# Patient Record
Sex: Female | Born: 1945 | Race: White | Hispanic: No | Marital: Married | Smoking: Former smoker
Health system: Southern US, Community
[De-identification: ages and names within clinical notes are randomized; demographics above are authoritative.]

## PROBLEM LIST (undated history)

## (undated) DIAGNOSIS — M1990 Unspecified osteoarthritis, unspecified site: Secondary | ICD-10-CM

## (undated) DIAGNOSIS — Z973 Presence of spectacles and contact lenses: Secondary | ICD-10-CM

## (undated) DIAGNOSIS — R51 Headache: Secondary | ICD-10-CM

## (undated) DIAGNOSIS — N390 Urinary tract infection, site not specified: Secondary | ICD-10-CM

## (undated) DIAGNOSIS — N189 Chronic kidney disease, unspecified: Secondary | ICD-10-CM

## (undated) DIAGNOSIS — J45909 Unspecified asthma, uncomplicated: Secondary | ICD-10-CM

## (undated) DIAGNOSIS — E785 Hyperlipidemia, unspecified: Secondary | ICD-10-CM

## (undated) DIAGNOSIS — M4316 Spondylolisthesis, lumbar region: Secondary | ICD-10-CM

## (undated) DIAGNOSIS — I10 Essential (primary) hypertension: Secondary | ICD-10-CM

## (undated) DIAGNOSIS — R011 Cardiac murmur, unspecified: Secondary | ICD-10-CM

## (undated) DIAGNOSIS — Z9889 Other specified postprocedural states: Secondary | ICD-10-CM

## (undated) DIAGNOSIS — T7840XA Allergy, unspecified, initial encounter: Secondary | ICD-10-CM

## (undated) DIAGNOSIS — F329 Major depressive disorder, single episode, unspecified: Secondary | ICD-10-CM

## (undated) DIAGNOSIS — F32A Depression, unspecified: Secondary | ICD-10-CM

## (undated) DIAGNOSIS — R112 Nausea with vomiting, unspecified: Secondary | ICD-10-CM

## (undated) DIAGNOSIS — G4730 Sleep apnea, unspecified: Secondary | ICD-10-CM

## (undated) HISTORY — PX: COLONOSCOPY: SHX174

## (undated) HISTORY — PX: ABDOMINAL HYSTERECTOMY: SHX81

## (undated) HISTORY — PX: CARDIAC CATHETERIZATION: SHX172

## (undated) HISTORY — PX: JOINT REPLACEMENT: SHX530

## (undated) HISTORY — PX: SHOULDER ARTHROSCOPY: SHX128

---

## 1998-12-09 HISTORY — PX: BREAST BIOPSY: SHX20

## 2005-01-01 ENCOUNTER — Ambulatory Visit: Payer: Self-pay | Admitting: Urology

## 2005-01-24 ENCOUNTER — Other Ambulatory Visit: Payer: Self-pay

## 2005-02-14 ENCOUNTER — Emergency Department: Payer: Self-pay | Admitting: Emergency Medicine

## 2005-03-11 ENCOUNTER — Ambulatory Visit: Payer: Self-pay | Admitting: Urology

## 2005-05-22 ENCOUNTER — Ambulatory Visit: Payer: Self-pay | Admitting: Family Medicine

## 2005-06-19 ENCOUNTER — Ambulatory Visit: Payer: Self-pay | Admitting: Otolaryngology

## 2005-06-26 ENCOUNTER — Ambulatory Visit: Payer: Self-pay | Admitting: Otolaryngology

## 2005-09-09 ENCOUNTER — Ambulatory Visit: Payer: Self-pay | Admitting: Unknown Physician Specialty

## 2006-03-04 ENCOUNTER — Inpatient Hospital Stay: Payer: Self-pay | Admitting: General Practice

## 2006-08-25 ENCOUNTER — Ambulatory Visit: Payer: Self-pay | Admitting: Obstetrics & Gynecology

## 2006-08-28 ENCOUNTER — Ambulatory Visit: Payer: Self-pay

## 2008-03-31 ENCOUNTER — Ambulatory Visit: Payer: Self-pay | Admitting: Family Medicine

## 2008-06-20 ENCOUNTER — Ambulatory Visit: Payer: Self-pay | Admitting: General Practice

## 2008-07-04 ENCOUNTER — Inpatient Hospital Stay: Payer: Self-pay | Admitting: General Practice

## 2008-08-10 ENCOUNTER — Ambulatory Visit: Payer: Self-pay | Admitting: General Practice

## 2008-09-15 ENCOUNTER — Ambulatory Visit: Payer: Self-pay | Admitting: General Practice

## 2008-09-28 ENCOUNTER — Ambulatory Visit: Payer: Self-pay | Admitting: General Practice

## 2008-10-06 ENCOUNTER — Encounter: Payer: Self-pay | Admitting: General Practice

## 2008-10-09 ENCOUNTER — Encounter: Payer: Self-pay | Admitting: General Practice

## 2008-11-08 ENCOUNTER — Encounter: Payer: Self-pay | Admitting: General Practice

## 2008-12-09 ENCOUNTER — Encounter: Payer: Self-pay | Admitting: General Practice

## 2009-03-20 ENCOUNTER — Encounter: Admission: RE | Admit: 2009-03-20 | Discharge: 2009-03-20 | Payer: Self-pay | Admitting: Neurology

## 2009-05-08 ENCOUNTER — Ambulatory Visit: Payer: Self-pay

## 2010-02-02 ENCOUNTER — Ambulatory Visit: Payer: Self-pay | Admitting: Unknown Physician Specialty

## 2010-03-20 ENCOUNTER — Ambulatory Visit: Payer: Self-pay | Admitting: Family Medicine

## 2010-05-03 ENCOUNTER — Ambulatory Visit: Payer: Self-pay

## 2010-05-16 ENCOUNTER — Ambulatory Visit: Payer: Self-pay | Admitting: Pain Medicine

## 2010-06-05 ENCOUNTER — Ambulatory Visit: Payer: Self-pay | Admitting: Cardiology

## 2010-06-07 ENCOUNTER — Ambulatory Visit: Payer: Self-pay | Admitting: Pain Medicine

## 2010-06-25 ENCOUNTER — Ambulatory Visit: Payer: Self-pay | Admitting: Pain Medicine

## 2010-08-07 ENCOUNTER — Ambulatory Visit: Payer: Self-pay | Admitting: Pain Medicine

## 2010-09-03 ENCOUNTER — Ambulatory Visit: Payer: Self-pay | Admitting: Pain Medicine

## 2010-09-13 ENCOUNTER — Ambulatory Visit: Payer: Self-pay | Admitting: Pain Medicine

## 2011-09-04 ENCOUNTER — Ambulatory Visit: Payer: Self-pay | Admitting: Family Medicine

## 2012-03-16 ENCOUNTER — Ambulatory Visit: Payer: Self-pay | Admitting: Physician Assistant

## 2012-03-17 ENCOUNTER — Ambulatory Visit: Payer: Self-pay | Admitting: Otolaryngology

## 2012-05-14 ENCOUNTER — Ambulatory Visit: Payer: Self-pay | Admitting: Pain Medicine

## 2012-06-18 ENCOUNTER — Ambulatory Visit: Payer: Self-pay | Admitting: Orthopedic Surgery

## 2012-06-25 ENCOUNTER — Ambulatory Visit: Payer: Self-pay | Admitting: Pain Medicine

## 2012-06-25 ENCOUNTER — Other Ambulatory Visit: Payer: Self-pay | Admitting: Orthopedic Surgery

## 2012-06-30 ENCOUNTER — Encounter (HOSPITAL_COMMUNITY): Payer: Self-pay | Admitting: Pharmacy Technician

## 2012-06-30 ENCOUNTER — Encounter (HOSPITAL_COMMUNITY): Payer: Self-pay

## 2012-06-30 ENCOUNTER — Encounter (HOSPITAL_COMMUNITY)
Admission: RE | Admit: 2012-06-30 | Discharge: 2012-06-30 | Disposition: A | Discharge status: Home / Self Care | Payer: Medicare Other | Source: Ambulatory Visit | Attending: Orthopedic Surgery | Admitting: Orthopedic Surgery

## 2012-06-30 HISTORY — DX: Unspecified osteoarthritis, unspecified site: M19.90

## 2012-06-30 HISTORY — DX: Sleep apnea, unspecified: G47.30

## 2012-06-30 HISTORY — DX: Chronic kidney disease, unspecified: N18.9

## 2012-06-30 HISTORY — DX: Essential (primary) hypertension: I10

## 2012-06-30 HISTORY — DX: Headache: R51

## 2012-06-30 HISTORY — DX: Other specified postprocedural states: R11.2

## 2012-06-30 HISTORY — DX: Other specified postprocedural states: Z98.890

## 2012-06-30 LAB — COMPREHENSIVE METABOLIC PANEL
Albumin: 3.8 g/dL (ref 3.5–5.2)
Alkaline Phosphatase: 84 U/L (ref 39–117)
BUN: 18 mg/dL (ref 6–23)
Creatinine, Ser: 0.78 mg/dL (ref 0.50–1.10)
GFR calc Af Amer: >90 mL/min (ref >90)
Glucose, Bld: 94 mg/dL (ref 70–99)
Total Bilirubin: 0.3 mg/dL (ref 0.3–1.2)
Total Protein: 6.8 g/dL (ref 6.0–8.3)

## 2012-06-30 LAB — PROTIME-INR
INR: 0.89 (ref 0.00–1.49)
Prothrombin Time: 12.2 seconds (ref 11.6–15.2)

## 2012-06-30 LAB — TYPE AND SCREEN: Antibody Screen: NEGATIVE

## 2012-06-30 LAB — CBC
Hemoglobin: 15.1 g/dL — ABNORMAL HIGH (ref 12.0–15.0)
MCH: 28.8 pg (ref 26.0–34.0)
MCHC: 32.8 g/dL (ref 30.0–36.0)
MCV: 87.8 fL (ref 78.0–100.0)
RBC: 5.25 MIL/uL — ABNORMAL HIGH (ref 3.87–5.11)

## 2012-06-30 LAB — URINALYSIS, ROUTINE W REFLEX MICROSCOPIC
Bilirubin Urine: NEGATIVE
Hgb urine dipstick: NEGATIVE
Ketones, ur: NEGATIVE mg/dL
Nitrite: NEGATIVE
Specific Gravity, Urine: 1.017 (ref 1.005–1.030)
pH: 7.5 (ref 5.0–8.0)

## 2012-06-30 LAB — SURGICAL PCR SCREEN: Staphylococcus aureus: NEGATIVE

## 2012-06-30 NOTE — Pre-Procedure Instructions (Addendum)
20 Paige Higgins  06/30/2012  Your procedure is scheduled on:  07-07-2012  Report to [facility] at 8:15 AM.  Call this number if you have problems the morning of surgery: [phone]   Remember:   Do not eat food or drink:After Midnight.  .    Take these medicines the morning of surgery with A SIP OF WATER: inhalers as needed,flonase,pain medication as needed,   Do not wear jewelry, make-up or nail polish.  Do not wear lotions, powders, or perfumes. You may wear deodorant.  Do not shave 48 hours prior to surgery. Men may shave face and neck.  Do not bring valuables to the hospital.  Contacts, dentures or bridgework may not be worn into surgery.  Leave suitcase in the car. After surgery it may be brought to your room.  For patients admitted to the hospital, checkout time is 11:00 AM the day of discharge.   Patients discharged the day of surgery will not be allowed to drive home.    Special Instructions: Incentive Spirometry - Practice and bring it with you on the day of surgery. and CHG Shower Use Special Wash: 1/2 bottle night before surgery and 1/2 bottle morning of surgery.   Please read over the following fact sheets that you were given: Pain Booklet, Blood Transfusion Information, MRSA Information and Surgical Site Infection Prevention

## 2012-06-30 NOTE — Progress Notes (Signed)
Requested stress test from [facility] and [place] study. Pt. To call back with phone number for Sleep Center

## 2012-07-01 NOTE — Consult Note (Signed)
Anesthesia Chart Review:  Patient is a 66 year old female scheduled for left reverse total shoulder on 07/07/12 by Dr. Ave Filter.  History includes former smoker, post-operative N/V, HTN, OSA, headaches, arthritis, UTI.  PCP is Dr. Burnett Sheng.  Her Cardiologist is Dr. Marcina Millard at [facility]).  She was last seen on 06/26/12.  He felt she would be low risk for this procedure.    EKG on 06/30/12 showed NSR, findings suggestive of RV conduction delay, anterior and inferior ST/T wave abnormality consider ischemia, incomplete right BBB.  We received comparison EKG from 2012 and 2011 from [facility] that appeared similar. On her 2012 EKG she had T wave inversion in V4-6 as well.    She had an abnormal stress test on 05/25/10 showing evidence of inferior ischemia, EF 50%.  She subsequently had a cardiac cath on 05/28/10 that showed normal coronary anatomy and EF 54%.    CXR on 06/30/12 showed cardiac enlargement. No active cardiopulmonary disease.   Labs acceptable.  Plan to proceed if no significant change in her status.  Shonna Chock, PA-C

## 2012-07-06 MED ORDER — CEFAZOLIN SODIUM-DEXTROSE 2-3 GM-% IV SOLR
2.0000 g | INTRAVENOUS | Status: AC
  Administered 2012-07-07: 2 g via INTRAVENOUS
  Filled 2012-07-06: qty 50

## 2012-07-07 ENCOUNTER — Ambulatory Visit (HOSPITAL_COMMUNITY): Payer: Medicare Other | Admitting: Vascular Surgery

## 2012-07-07 ENCOUNTER — Encounter (HOSPITAL_COMMUNITY)
Admission: RE | Disposition: A | Discharge status: Home / Self Care | Payer: Self-pay | Source: Ambulatory Visit | Attending: Orthopedic Surgery

## 2012-07-07 ENCOUNTER — Encounter (HOSPITAL_COMMUNITY): Payer: Self-pay | Admitting: Vascular Surgery

## 2012-07-07 ENCOUNTER — Inpatient Hospital Stay (HOSPITAL_COMMUNITY)
Admission: RE | Admit: 2012-07-07 | Discharge: 2012-07-09 | DRG: 484 | Disposition: A | Discharge status: Home / Self Care | Payer: Medicare Other | Source: Ambulatory Visit | Attending: Orthopedic Surgery | Admitting: Orthopedic Surgery

## 2012-07-07 ENCOUNTER — Inpatient Hospital Stay (HOSPITAL_COMMUNITY): Payer: Medicare Other

## 2012-07-07 ENCOUNTER — Encounter (HOSPITAL_COMMUNITY): Payer: Self-pay | Admitting: Surgery

## 2012-07-07 DIAGNOSIS — I129 Hypertensive chronic kidney disease with stage 1 through stage 4 chronic kidney disease, or unspecified chronic kidney disease: Secondary | ICD-10-CM | POA: Diagnosis present

## 2012-07-07 DIAGNOSIS — N189 Chronic kidney disease, unspecified: Secondary | ICD-10-CM | POA: Diagnosis present

## 2012-07-07 DIAGNOSIS — M719 Bursopathy, unspecified: Secondary | ICD-10-CM | POA: Diagnosis present

## 2012-07-07 DIAGNOSIS — M19019 Primary osteoarthritis, unspecified shoulder: Principal | ICD-10-CM | POA: Diagnosis present

## 2012-07-07 DIAGNOSIS — M67919 Unspecified disorder of synovium and tendon, unspecified shoulder: Secondary | ICD-10-CM | POA: Diagnosis present

## 2012-07-07 DIAGNOSIS — Z96659 Presence of unspecified artificial knee joint: Secondary | ICD-10-CM

## 2012-07-07 HISTORY — PX: ARTHROPLASTY: SHX135

## 2012-07-07 HISTORY — PX: REVERSE SHOULDER ARTHROPLASTY: SHX5054

## 2012-07-07 SURGERY — ARTHROPLASTY, SHOULDER, TOTAL, REVERSE
Anesthesia: General | Site: Shoulder | Laterality: Left | Wound class: Clean

## 2012-07-07 MED ORDER — ONDANSETRON HCL 4 MG PO TABS
4.0000 mg | ORAL_TABLET | Freq: Four times a day (QID) | ORAL | Status: DC | PRN
  Administered 2012-07-09: 4 mg via ORAL
  Filled 2012-07-07: qty 1

## 2012-07-07 MED ORDER — ZOLPIDEM TARTRATE 5 MG PO TABS: 5.0000 mg | ORAL_TABLET | Freq: Every evening | ORAL | Status: DC | PRN

## 2012-07-07 MED ORDER — ALBUTEROL SULFATE (5 MG/ML) 0.5% IN NEBU: 2.5000 mg | INHALATION_SOLUTION | Freq: Four times a day (QID) | RESPIRATORY_TRACT | Status: DC | PRN

## 2012-07-07 MED ORDER — METOCLOPRAMIDE HCL 5 MG PO TABS
5.0000 mg | ORAL_TABLET | Freq: Three times a day (TID) | ORAL | Status: DC | PRN
  Filled 2012-07-07: qty 2

## 2012-07-07 MED ORDER — FENTANYL CITRATE 0.05 MG/ML IJ SOLN
INTRAMUSCULAR | Status: DC | PRN
  Administered 2012-07-07 (×2): 100 ug via INTRAVENOUS

## 2012-07-07 MED ORDER — PROPOFOL 10 MG/ML IV EMUL
INTRAVENOUS | Status: DC | PRN
  Administered 2012-07-07: 200 mg via INTRAVENOUS

## 2012-07-07 MED ORDER — ACETAMINOPHEN 650 MG RE SUPP: 650.0000 mg | Freq: Four times a day (QID) | RECTAL | Status: DC | PRN

## 2012-07-07 MED ORDER — ALUM & MAG HYDROXIDE-SIMETH 200-200-20 MG/5ML PO SUSP: 30.0000 mL | ORAL | Status: DC | PRN

## 2012-07-07 MED ORDER — FENTANYL CITRATE 0.05 MG/ML IJ SOLN
50.0000 ug | INTRAMUSCULAR | Status: DC | PRN
  Administered 2012-07-07: 100 ug via INTRAVENOUS

## 2012-07-07 MED ORDER — DEXTROSE 5 % IV SOLN
INTRAVENOUS | Status: DC | PRN
  Administered 2012-07-07: 11:00:00 via INTRAVENOUS

## 2012-07-07 MED ORDER — MIDAZOLAM HCL 2 MG/2ML IJ SOLN
INTRAMUSCULAR | Status: AC
  Filled 2012-07-07: qty 2

## 2012-07-07 MED ORDER — ALBUTEROL SULFATE HFA 108 (90 BASE) MCG/ACT IN AERS: 2.0000 | INHALATION_SPRAY | RESPIRATORY_TRACT | Status: DC | PRN

## 2012-07-07 MED ORDER — LIDOCAINE HCL (CARDIAC) 20 MG/ML IV SOLN
INTRAVENOUS | Status: DC | PRN
  Administered 2012-07-07: 90 mg via INTRAVENOUS

## 2012-07-07 MED ORDER — SODIUM CHLORIDE 0.9 % IR SOLN
Status: DC | PRN
  Administered 2012-07-07: 1000 mL

## 2012-07-07 MED ORDER — METHOCARBAMOL 100 MG/ML IJ SOLN
500.0000 mg | Freq: Four times a day (QID) | INTRAVENOUS | Status: DC | PRN
  Filled 2012-07-07: qty 5

## 2012-07-07 MED ORDER — ONDANSETRON HCL 4 MG/2ML IJ SOLN: 4.0000 mg | Freq: Once | INTRAMUSCULAR | Status: DC | PRN

## 2012-07-07 MED ORDER — MORPHINE SULFATE 2 MG/ML IJ SOLN
2.0000 mg | INTRAMUSCULAR | Status: DC | PRN
  Administered 2012-07-07 – 2012-07-08 (×3): 2 mg via INTRAVENOUS
  Filled 2012-07-07 (×4): qty 1

## 2012-07-07 MED ORDER — TRETINOIN 0.025 % EX CREA
1.0000 "application " | TOPICAL_CREAM | Freq: Every day | CUTANEOUS | Status: DC
  Filled 2012-07-07: qty 45

## 2012-07-07 MED ORDER — ONDANSETRON HCL 4 MG/2ML IJ SOLN: 4.0000 mg | Freq: Four times a day (QID) | INTRAMUSCULAR | Status: DC | PRN

## 2012-07-07 MED ORDER — PHENOL 1.4 % MT LIQD: 1.0000 | OROMUCOSAL | Status: DC | PRN

## 2012-07-07 MED ORDER — SCOPOLAMINE 1 MG/3DAYS TD PT72
1.0000 | MEDICATED_PATCH | TRANSDERMAL | Status: DC
  Administered 2012-07-07: 1.5 mg via TRANSDERMAL
  Filled 2012-07-07: qty 1

## 2012-07-07 MED ORDER — HYDROMORPHONE HCL PF 1 MG/ML IJ SOLN
0.2500 mg | INTRAMUSCULAR | Status: DC | PRN
  Administered 2012-07-07 (×2): 0.5 mg via INTRAVENOUS

## 2012-07-07 MED ORDER — DOCUSATE SODIUM 100 MG PO CAPS
100.0000 mg | ORAL_CAPSULE | Freq: Two times a day (BID) | ORAL | Status: DC
  Administered 2012-07-07 – 2012-07-09 (×4): 100 mg via ORAL
  Filled 2012-07-07 (×4): qty 1

## 2012-07-07 MED ORDER — MIDAZOLAM HCL 2 MG/2ML IJ SOLN
1.0000 mg | INTRAMUSCULAR | Status: DC | PRN
  Administered 2012-07-07: 4 mg via INTRAVENOUS

## 2012-07-07 MED ORDER — OXYCODONE-ACETAMINOPHEN 5-325 MG PO TABS
1.0000 | ORAL_TABLET | ORAL | Status: DC | PRN
  Administered 2012-07-07 – 2012-07-09 (×8): 2 via ORAL
  Filled 2012-07-07 (×9): qty 2

## 2012-07-07 MED ORDER — METHOCARBAMOL 500 MG PO TABS
500.0000 mg | ORAL_TABLET | Freq: Four times a day (QID) | ORAL | Status: DC | PRN
  Administered 2012-07-07 – 2012-07-09 (×3): 500 mg via ORAL
  Filled 2012-07-07 (×3): qty 1

## 2012-07-07 MED ORDER — ACETAMINOPHEN 10 MG/ML IV SOLN
INTRAVENOUS | Status: DC | PRN
  Administered 2012-07-07: 1000 mg via INTRAVENOUS

## 2012-07-07 MED ORDER — FENTANYL CITRATE 0.05 MG/ML IJ SOLN
INTRAMUSCULAR | Status: AC
  Filled 2012-07-07: qty 2

## 2012-07-07 MED ORDER — ONDANSETRON HCL 4 MG/2ML IJ SOLN
INTRAMUSCULAR | Status: DC | PRN
  Administered 2012-07-07: 4 mg via INTRAVENOUS

## 2012-07-07 MED ORDER — LACTATED RINGERS IV SOLN
INTRAVENOUS | Status: DC | PRN
  Administered 2012-07-07 (×3): via INTRAVENOUS

## 2012-07-07 MED ORDER — BISACODYL 5 MG PO TBEC: 5.0000 mg | DELAYED_RELEASE_TABLET | Freq: Every day | ORAL | Status: DC | PRN

## 2012-07-07 MED ORDER — ACETAMINOPHEN 325 MG PO TABS: 650.0000 mg | ORAL_TABLET | Freq: Four times a day (QID) | ORAL | Status: DC | PRN

## 2012-07-07 MED ORDER — CEFAZOLIN SODIUM 1-5 GM-% IV SOLN
1.0000 g | Freq: Four times a day (QID) | INTRAVENOUS | Status: AC
  Administered 2012-07-07 – 2012-07-08 (×3): 1 g via INTRAVENOUS
  Filled 2012-07-07 (×4): qty 50

## 2012-07-07 MED ORDER — BUPIVACAINE-EPINEPHRINE PF 0.5-1:200000 % IJ SOLN
INTRAMUSCULAR | Status: DC | PRN
  Administered 2012-07-07: 20 mL

## 2012-07-07 MED ORDER — NEOSTIGMINE METHYLSULFATE 1 MG/ML IJ SOLN
INTRAMUSCULAR | Status: DC | PRN
  Administered 2012-07-07: 4 mg via INTRAVENOUS

## 2012-07-07 MED ORDER — SUMATRIPTAN SUCCINATE 50 MG PO TABS
50.0000 mg | ORAL_TABLET | ORAL | Status: DC | PRN
  Administered 2012-07-08 (×2): 50 mg via ORAL
  Filled 2012-07-07 (×2): qty 1

## 2012-07-07 MED ORDER — DIPHENHYDRAMINE HCL 12.5 MG/5ML PO ELIX: 12.5000 mg | ORAL_SOLUTION | ORAL | Status: DC | PRN

## 2012-07-07 MED ORDER — ASPIRIN EC 325 MG PO TBEC
325.0000 mg | DELAYED_RELEASE_TABLET | Freq: Two times a day (BID) | ORAL | Status: DC
  Administered 2012-07-07 – 2012-07-09 (×5): 325 mg via ORAL
  Filled 2012-07-07 (×6): qty 1

## 2012-07-07 MED ORDER — LACTATED RINGERS IV SOLN
INTRAVENOUS | Status: DC
Start: 2012-07-07 — End: 2012-07-07

## 2012-07-07 MED ORDER — ROCURONIUM BROMIDE 100 MG/10ML IV SOLN
INTRAVENOUS | Status: DC | PRN
  Administered 2012-07-07: 50 mg via INTRAVENOUS
  Administered 2012-07-07 (×2): 10 mg via INTRAVENOUS

## 2012-07-07 MED ORDER — GLYCOPYRROLATE 0.2 MG/ML IJ SOLN
INTRAMUSCULAR | Status: DC | PRN
  Administered 2012-07-07: 0.6 mg via INTRAVENOUS

## 2012-07-07 MED ORDER — TRIAMTERENE-HCTZ 37.5-25 MG PO TABS
1.0000 | ORAL_TABLET | Freq: Every day | ORAL | Status: DC
  Administered 2012-07-07 – 2012-07-09 (×3): 1 via ORAL
  Filled 2012-07-07 (×3): qty 1

## 2012-07-07 MED ORDER — HYDROMORPHONE HCL PF 1 MG/ML IJ SOLN
INTRAMUSCULAR | Status: AC
  Administered 2012-07-07: 1 mg
  Filled 2012-07-07: qty 1

## 2012-07-07 MED ORDER — MENTHOL 3 MG MT LOZG
1.0000 | LOZENGE | OROMUCOSAL | Status: DC | PRN
  Filled 2012-07-07: qty 9

## 2012-07-07 MED ORDER — IPRATROPIUM BROMIDE 0.06 % NA SOLN
2.0000 | Freq: Two times a day (BID) | NASAL | Status: DC
  Administered 2012-07-07 – 2012-07-09 (×3): 2 via NASAL
  Filled 2012-07-07: qty 15

## 2012-07-07 MED ORDER — METOCLOPRAMIDE HCL 5 MG/ML IJ SOLN
5.0000 mg | Freq: Three times a day (TID) | INTRAMUSCULAR | Status: DC | PRN
  Administered 2012-07-07: 10 mg via INTRAVENOUS
  Filled 2012-07-07: qty 2

## 2012-07-07 MED ORDER — CHLORHEXIDINE GLUCONATE 0.12 % MT SOLN
15.0000 mL | Freq: Two times a day (BID) | OROMUCOSAL | Status: DC
  Administered 2012-07-07 – 2012-07-09 (×3): 15 mL via OROMUCOSAL
  Filled 2012-07-07 (×5): qty 15

## 2012-07-07 MED ORDER — FLUTICASONE PROPIONATE 50 MCG/ACT NA SUSP: 2.0000 | Freq: Every day | NASAL | Status: DC | PRN

## 2012-07-07 MED ORDER — PHENYLEPHRINE HCL 10 MG/ML IJ SOLN
INTRAMUSCULAR | Status: DC | PRN
  Administered 2012-07-07 (×2): 40 ug via INTRAVENOUS
  Administered 2012-07-07: 80 ug via INTRAVENOUS
  Administered 2012-07-07: 40 ug via INTRAVENOUS

## 2012-07-07 MED ORDER — ATORVASTATIN CALCIUM 40 MG PO TABS
40.0000 mg | ORAL_TABLET | Freq: Every day | ORAL | Status: DC
  Administered 2012-07-07 – 2012-07-08 (×2): 40 mg via ORAL
  Filled 2012-07-07 (×3): qty 1

## 2012-07-07 MED ORDER — POVIDONE-IODINE 7.5 % EX SOLN
Freq: Once | CUTANEOUS | Status: DC
  Filled 2012-07-07: qty 118

## 2012-07-07 SURGICAL SUPPLY — 67 items
BIT DRILL 170X2.5X (BIT) ×1 IMPLANT
BIT DRL 170X2.5X (BIT) ×1
BLADE SAW SAG 73X25 THK (BLADE) ×1
BLADE SAW SGTL 73X25 THK (BLADE) ×1 IMPLANT
BOWL SMART MIX CTS (DISPOSABLE) IMPLANT
CHLORAPREP W/TINT 26ML (MISCELLANEOUS) ×2 IMPLANT
CLOTH BEACON ORANGE TIMEOUT ST (SAFETY) ×2 IMPLANT
CLSR STERI-STRIP ANTIMIC 1/2X4 (GAUZE/BANDAGES/DRESSINGS) ×2 IMPLANT
COVER SURGICAL LIGHT HANDLE (MISCELLANEOUS) ×2 IMPLANT
DRAPE INCISE IOBAN 66X45 STRL (DRAPES) ×2 IMPLANT
DRAPE SURG 17X23 STRL (DRAPES) ×2 IMPLANT
DRAPE U-SHAPE 47X51 STRL (DRAPES) ×2 IMPLANT
DRILL 2.5 (BIT) ×1
DRSG ADAPTIC 3X8 NADH LF (GAUZE/BANDAGES/DRESSINGS) ×2 IMPLANT
DRSG PAD ABDOMINAL 8X10 ST (GAUZE/BANDAGES/DRESSINGS) ×2 IMPLANT
ELECT REM PT RETURN 9FT ADLT (ELECTROSURGICAL) ×2
ELECTRODE REM PT RTRN 9FT ADLT (ELECTROSURGICAL) ×1 IMPLANT
EVACUATOR 1/8 PVC DRAIN (DRAIN) ×2 IMPLANT
GLOVE BIO SURGEON STRL SZ7 (GLOVE) ×2 IMPLANT
GLOVE BIO SURGEON STRL SZ7.5 (GLOVE) ×2 IMPLANT
GLOVE BIOGEL PI IND STRL 8 (GLOVE) ×1 IMPLANT
GLOVE BIOGEL PI INDICATOR 8 (GLOVE) ×1
GOWN PREVENTION PLUS LG XLONG (DISPOSABLE) ×2 IMPLANT
GOWN PREVENTION PLUS XLARGE (GOWN DISPOSABLE) ×2 IMPLANT
GOWN STRL NON-REIN LRG LVL3 (GOWN DISPOSABLE) ×4 IMPLANT
HANDPIECE INTERPULSE COAX TIP (DISPOSABLE) ×1
HEMOSTAT SURGICEL 2X14 (HEMOSTASIS) IMPLANT
HOOD PEEL AWAY FACE SHEILD DIS (HOOD) ×4 IMPLANT
KIT BASIN OR (CUSTOM PROCEDURE TRAY) ×2 IMPLANT
KIT ROOM TURNOVER OR (KITS) ×2 IMPLANT
MANIFOLD NEPTUNE II (INSTRUMENTS) ×2 IMPLANT
NEEDLE HYPO 25GX1X1/2 BEV (NEEDLE) ×2 IMPLANT
NEEDLE MAYO TROCAR (NEEDLE) ×2 IMPLANT
NOZZLE PRISM 8.5MM (MISCELLANEOUS) ×2 IMPLANT
NS IRRIG 1000ML POUR BTL (IV SOLUTION) ×2 IMPLANT
PACK SHOULDER (CUSTOM PROCEDURE TRAY) ×2 IMPLANT
PAD ARMBOARD 7.5X6 YLW CONV (MISCELLANEOUS) ×4 IMPLANT
PIN GUIDE 1.2 (PIN) ×2 IMPLANT
PIN GUIDE GLENOPHERE 1.5MX300M (PIN) ×2 IMPLANT
PIN METAGLENE 2.5 (PIN) ×2 IMPLANT
RETRIEVER SUT HEWSON (MISCELLANEOUS) IMPLANT
SET HNDPC FAN SPRY TIP SCT (DISPOSABLE) ×1 IMPLANT
SLING ARM IMMOBILIZER LRG (SOFTGOODS) ×2 IMPLANT
SLING ARM IMMOBILIZER MED (SOFTGOODS) IMPLANT
SPONGE GAUZE 4X4 12PLY (GAUZE/BANDAGES/DRESSINGS) ×2 IMPLANT
SPONGE LAP 18X18 X RAY DECT (DISPOSABLE) ×2 IMPLANT
SPONGE LAP 4X18 X RAY DECT (DISPOSABLE) ×4 IMPLANT
STRIP CLOSURE SKIN 1/2X4 (GAUZE/BANDAGES/DRESSINGS) ×2 IMPLANT
SUCTION FRAZIER TIP 10 FR DISP (SUCTIONS) ×2 IMPLANT
SUPPORT WRAP ARM LG (MISCELLANEOUS) ×2 IMPLANT
SUT ETHIBOND 2 OS 4 DA (SUTURE) ×2 IMPLANT
SUT FIBERWIRE #2 38 T-5 BLUE (SUTURE) ×8
SUT MNCRL AB 4-0 PS2 18 (SUTURE) ×2 IMPLANT
SUT SILK 2 0 TIES 17X18 (SUTURE) ×1
SUT SILK 2-0 18XBRD TIE BLK (SUTURE) ×1 IMPLANT
SUT VIC AB 0 CTB1 27 (SUTURE) ×2 IMPLANT
SUT VIC AB 2-0 CT1 27 (SUTURE) ×2
SUT VIC AB 2-0 CT1 TAPERPNT 27 (SUTURE) ×2 IMPLANT
SUTURE FIBERWR #2 38 T-5 BLUE (SUTURE) ×4 IMPLANT
SYR CONTROL 10ML LL (SYRINGE) ×2 IMPLANT
SYRINGE TOOMEY DISP (SYRINGE) ×2 IMPLANT
TAPE CLOTH SURG 6X10 WHT LF (GAUZE/BANDAGES/DRESSINGS) ×2 IMPLANT
TOWEL OR 17X24 6PK STRL BLUE (TOWEL DISPOSABLE) ×2 IMPLANT
TOWEL OR 17X26 10 PK STRL BLUE (TOWEL DISPOSABLE) ×2 IMPLANT
TRAY FOLEY CATH 14FR (SET/KITS/TRAYS/PACK) IMPLANT
WATER STERILE IRR 1000ML POUR (IV SOLUTION) ×2 IMPLANT
YANKAUER SUCT BULB TIP NO VENT (SUCTIONS) ×2 IMPLANT

## 2012-07-07 NOTE — Transfer of Care (Signed)
Immediate Anesthesia Transfer of Care Note  Patient: Paige Higgins  Procedure(s) Performed: Procedure(s) (LRB): REVERSE SHOULDER ARTHROPLASTY (Left)  Patient Location: PACU  Anesthesia Type: General  Level of Consciousness: awake and patient cooperative  Airway & Oxygen Therapy: Patient Spontanous Breathing and Patient connected to nasal cannula oxygen  Post-op Assessment: Report given to PACU RN, Post -op Vital signs reviewed and stable and Patient moving all extremities  Post vital signs: Reviewed and stable  Complications: No apparent anesthesia complications

## 2012-07-07 NOTE — H&P (Signed)
Paige Higgins is an 66 y.o. female.   Chief Complaint: L shoulder pain HPI: L shoulder rotator cuff tear arthropathy, failed nonoperative treatment  Past Medical History  Diagnosis Date  . PONV (postoperative nausea and vomiting)   . Hypertension   . Sleep apnea     sleep Study Normal  . Chronic kidney disease     hx. UTI  . Headache   . Arthritis     Past Surgical History  Procedure Date  . Abdominal hysterectomy   . Joint replacement     bilateral knee replacements  . Shoulder arthroscopy     right shoulder    History reviewed. No pertinent family history. Social History:  reports that she has quit smoking. Her smoking use included Cigarettes. She has a 4 pack-year smoking history. She does not have any smokeless tobacco history on file. She reports that she does not drink alcohol or use illicit drugs.  Allergies:  Allergies  Allergen Reactions  . Prednisone Rash and Other (See Comments)    Headache, facial redness.    Medications Prior to Admission  Medication Sig Dispense Refill  . albuterol (PROVENTIL) (2.5 MG/3ML) 0.083% nebulizer solution Take 2.5 mg by nebulization every 6 (six) hours as needed. For shortness of breath or wheezing.      Marland Kitchen atorvastatin (LIPITOR) 40 MG tablet Take 40 mg by mouth daily.      . celecoxib (CELEBREX) 200 MG capsule Take 200-400 mg by mouth daily as needed. For pain.      . chlorhexidine (PERIDEX) 0.12 % solution Use as directed 15 mLs in the mouth or throat 2 (two) times daily. Rinse and spit.      . fluticasone (FLONASE) 50 MCG/ACT nasal spray Place 2 sprays into the nose daily as needed. For congestion.      Marland Kitchen HYDROcodone-acetaminophen (NORCO/VICODIN) 5-325 MG per tablet Take 1-2 tablets by mouth every 12 (twelve) hours as needed. For pain.      Marland Kitchen ipratropium (ATROVENT) 0.06 % nasal spray Place 2 sprays into the nose 2 (two) times daily.      . SUMAtriptan (IMITREX) 50 MG tablet Take 50 mg by mouth every 2 (two) hours as needed. For  migraine.      . tretinoin (RETIN-A) 0.025 % cream Apply 1 application topically at bedtime.      . triamterene-hydrochlorothiazide (MAXZIDE-25) 37.5-25 MG per tablet Take 1 tablet by mouth daily.      Marland Kitchen albuterol (PROVENTIL HFA;VENTOLIN HFA) 108 (90 BASE) MCG/ACT inhaler Inhale 2 puffs into the lungs every 4 (four) hours as needed. For wheezing.        No results found for this or any previous visit (from the past 48 hour(s)). No results found.  Review of Systems  All other systems reviewed and are negative.    Blood pressure 139/89, pulse 97, temperature 97.6 F (36.4 C), temperature source Oral, resp. rate 18, SpO2 99.00%. Physical Exam  Constitutional: She is oriented to person, place, and time. She appears well-developed and well-nourished.  HENT:  Head: Atraumatic.  Eyes: EOM are normal.  Neck: Neck supple.  Cardiovascular: Intact distal pulses.   Respiratory: Effort normal.  Musculoskeletal:       Left shoulder: She exhibits decreased range of motion and tenderness. She exhibits no swelling.  Neurological: She is alert and oriented to person, place, and time.  Skin: Skin is warm and dry.  Psychiatric: She has a normal mood and affect.     Assessment/Plan L shoulder  rotator cuff tear arthropathy Plan L reverse total shoulder replacement Risks / benefits of surgery discussed Consent on chart  NPO for OR Preop antibiotics   Morell Mears WILLIAM 07/07/2012, 10:26 AM

## 2012-07-07 NOTE — Progress Notes (Signed)
Pt arouses left arm in a sling fingers warm pt states she feels sensation able to move fingers 3+radial pulse

## 2012-07-07 NOTE — Progress Notes (Signed)
Patient Paige Higgins, 66 year old white female is struggling with several health challenges.  This [name],  native expresses a positive attitude.  She expressed appreciation for Chaplain's provision of pastoral prayer, presence, and conversation.  I will follow up as needed.

## 2012-07-07 NOTE — Anesthesia Preprocedure Evaluation (Addendum)
Anesthesia Evaluation  Patient identified by MRN, date of birth, ID band Patient awake    Reviewed: Allergy & Precautions, H&P , NPO status , Patient's Chart, lab work & pertinent test results, reviewed documented beta blocker date and time   History of Anesthesia Complications (+) PONV  Airway Mallampati: I TM Distance: >3 FB Neck ROM: Full    Dental  (+) Teeth Intact, Dental Advisory Given, Caps and Implants   Pulmonary sleep apnea ,  breath sounds clear to auscultation        Cardiovascular hypertension, Pt. on medications Rhythm:Regular Rate:Normal     Neuro/Psych    GI/Hepatic   Endo/Other  Morbid obesity  Renal/GU      Musculoskeletal   Abdominal   Peds  Hematology   Anesthesia Other Findings Top front multi unit implants...  Reproductive/Obstetrics                         Anesthesia Physical Anesthesia Plan  ASA: II  Anesthesia Plan: General   Post-op Pain Management:    Induction: Intravenous  Airway Management Planned: Oral ETT  Additional Equipment:   Intra-op Plan:   Post-operative Plan: Extubation in OR  Informed Consent: I have reviewed the patients History and Physical, chart, labs and discussed the procedure including the risks, benefits and alternatives for the proposed anesthesia with the patient or authorized representative who has indicated his/her understanding and acceptance.   Dental advisory given  Plan Discussed with: CRNA, Anesthesiologist and Surgeon  Anesthesia Plan Comments:         Anesthesia Quick Evaluation

## 2012-07-07 NOTE — Progress Notes (Signed)
Clamped hemovac per order

## 2012-07-07 NOTE — Anesthesia Procedure Notes (Addendum)
Procedure Name: Intubation Date/Time: 07/07/2012 10:58 AM Performed by: Darcey Nora B Pre-anesthesia Checklist: Patient identified, Emergency Drugs available, Suction available and Patient being monitored Patient Re-evaluated:Patient Re-evaluated prior to inductionOxygen Delivery Method: Circle system utilized Preoxygenation: Pre-oxygenation with 100% oxygen Intubation Type: IV induction Ventilation: Mask ventilation without difficulty Laryngoscope Size: Mac and 4 Grade View: Grade I Tube type: Oral Tube size: 7.5 mm Number of attempts: 1 Airway Equipment and Method: Stylet Placement Confirmation: ETT inserted through vocal cords under direct vision,  positive ETCO2 and breath sounds checked- equal and bilateral Secured at: 21 (cm at teeth) cm Tube secured with: Tape Dental Injury: Teeth and Oropharynx as per pre-operative assessment    Anesthesia Regional Block:  Interscalene brachial plexus block  Pre-Anesthetic Checklist: ,, timeout performed, Correct Patient, Correct Site, Correct Laterality, Correct Procedure, Correct Position, site marked, Risks and benefits discussed,  Surgical consent,  Pre-op evaluation,  At surgeon's request and post-op pain management  Laterality: Left and Upper  Prep: chloraprep       Needles:  Injection technique: Single-shot  Needle Type: Echogenic Needle     Needle Length: 5cm 5 cm Needle Gauge: 22 and 22 G    Additional Needles:  Procedures: ultrasound guided Interscalene brachial plexus block Narrative:  Start time: 07/07/2012 10:25 AM End time: 07/07/2012 10:33 AM Injection made incrementally with aspirations every 5 mL.  Performed by: Personally  Anesthesiologist: Sheldon Silvan  Supraclavicular block

## 2012-07-07 NOTE — Op Note (Signed)
Procedure(s): REVERSE SHOULDER ARTHROPLASTY Procedure Note  Paige Higgins female 66 y.o. 07/07/2012  Procedure(s) and Anesthesia Type:    * REVERSE SHOULDER ARTHROPLASTY - Choice   Indications:  66 y.o. 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 Pain and dysfunction interfered with quality of life and nonoperative treatment with activity modification, NSAIDS and injections failed.     Surgeon: Mable Paris   Assistants: Skip Mayer PA-C (Blair's assistance was essential throughout the case for retraction and positioning)  Anesthesia: General endotracheal anesthesia with preoperative interscalene block     Procedure Detail  REVERSE SHOULDER ARTHROPLASTY   Estimated Blood Loss:  450         Drains: 1 medium hemovac  Blood Given: none          Specimens: none        Complications:  * No complications entered in OR log *         Disposition: PACU - hemodynamically stable.         Condition: stable      OPERATIVE FINDINGS:  A DePuy non- cemented reverse total shoulder arthroplasty was placed with a  size 10 stem, a 38 glenosphere, and a 9-mm poly insert. The base plate  fixation was excellent with 3 screws.  PROCEDURE: The patient was identified in the preoperative holding area  where I personally marked the operative site after verifying site, side,  and procedure with the patient. An interscalene block given by  the attending anesthesiologist in the holding area and the patient was taken back to the operating room where all extremities were  carefully padded in position after general anesthesia was induced. She  was placed in a beach-chair position and the operative upper extremity was  prepped and draped in a standard sterile fashion. An approximately 10-  cm incision was made from the tip of the coracoid process to the center  point of the humerus at the level of the axilla. Dissection was carried  down  through subcutaneous tissues to the level of the cephalic vein  which was taken laterally with the deltoid. The pectoralis major was  retracted medially. The subdeltoid space was developed and the lateral  edge of the conjoined tendon was identified. The undersurface of  conjoined tendon was palpated and the musculocutaneous nerve was not in  the field. Retractor was placed underneath the conjoined and second  retractor was placed lateral into the deltoid. The circumflex humeral  artery and vessels were identified and clamped and coagulated. The  biceps tendon was tenodesed.  The subscapularis was intact and was peeled off the lesser tuberosity after tagging with a suture.  The  joint was then gently externally rotated while the capsule was released  from the humeral neck around to just beyond the 6 o'clock position. At  this point, the joint was dislocated and the humeral head was presented  into the wound. The excessive osteophyte formation was removed with a  large rongeur and the intramedullary canal was then entered with  sequential reamers up to a 10-mm reamer which was felt to be the  appropriate size. The cutting guide was used to make the appropriate  head cut and the head was saved potentially bone grafting.  The proximal metaphyseal reaming guide was then placed and the appropriate size reamer was selected. The metaphyseal bone was then reamed. The trial implant was then placed. The trial was then left in place while the glenoid was exposed with  the arm in an  abducted extended position. The anterior and posterior labrum were  completely excised and the capsule was released circumferentially to  allow for exposure of the glenoid for preparation. The guidepin was  placed using the guide in neutral angulation and the reamer was  placed over the guidepin to ream down to concentric surface. Superior  hand reamer was used as well. The central drill hole was then made and  stayed within  the glenoid vault. The Metaglene was then impacted with  Good press fit. The superior and inferior screws were then  drilled, measured, and filled with appropriate-sized locking screw  alternatively tightening top and bottom to bring the Metaglene down  tightly against the bone. Posterior screw was then placed, the anterior screw was not placed as it did not have good bone stock anteriorly. Fixation was excellent.  The humerus was then again exposed and the trial implant was removed. The gleno sphere was placed in the metaphysis of the proximal humerus and then reduced to the base plate.  The glenosphere was then impacted over the [facility] taper and tightened down  with the screw. The trial implant was then again placed in the proximal humerus and the poly trials were tested and the above implant was felt to be the most appropriate soft tissue tensioning with excellent stability and  excellent range of motion. Therefore, final humeral stem was placed with good press fit using some bone graft.  And then the trial polyethylene inserts were tested again and the above implant was felt to be the most appropriate for final insertion. The joint was reduced taken through full range of motion and felt to be stable. Soft tissue tension was appropriate. A medium Hemovac drain was placed out underneath the deltoid prior to closure. The joint was then copiously irrigated with pulse  lavage and the wound was then closed. The subscapularis was repaired using 3 #2 Ethibond sutures through drill holes were placed prior to implantation of the stem.  Skin was closed with 2-0 Vicryl in a deep dermal layer and 4-0  Monocryl for skin closure. Steri-Strips were applied.terile  dressings were then applied as well as a sling. The patient was allowed  to awaken from general anesthesia, transferred to stretcher, and taken  to recovery room in stable condition.   POSTOPERATIVE PLAN: The patient will be kept in the hospital  postoperatively  for pain control and therapy.

## 2012-07-07 NOTE — Anesthesia Postprocedure Evaluation (Signed)
  Anesthesia Post-op Note  Patient: Paige Higgins  Procedure(s) Performed: Procedure(s) (LRB): REVERSE SHOULDER ARTHROPLASTY (Left)  Patient Location: PACU  Anesthesia Type: General  Level of Consciousness: awake  Airway and Oxygen Therapy: Patient Spontanous Breathing  Post-op Pain: mild  Post-op Assessment: Post-op Vital signs reviewed  Post-op Vital Signs: Reviewed  Complications: No apparent anesthesia complications 

## 2012-07-07 NOTE — Anesthesia Postprocedure Evaluation (Signed)
  Anesthesia Post-op Note  Patient: Paige Higgins  Procedure(s) Performed: Procedure(s) (LRB): REVERSE SHOULDER ARTHROPLASTY (Left)  Patient Location: PACU  Anesthesia Type: General  Level of Consciousness: awake  Airway and Oxygen Therapy: Patient Spontanous Breathing  Post-op Pain: mild  Post-op Assessment: Post-op Vital signs reviewed  Post-op Vital Signs: Reviewed  Complications: No apparent anesthesia complications

## 2012-07-07 NOTE — Preoperative (Signed)
Beta Blockers   Reason not to administer Beta Blockers:Not Applicable 

## 2012-07-08 ENCOUNTER — Encounter (HOSPITAL_COMMUNITY): Payer: Self-pay | Admitting: General Practice

## 2012-07-08 LAB — BASIC METABOLIC PANEL
Calcium: 8.5 mg/dL (ref 8.4–10.5)
Creatinine, Ser: 0.79 mg/dL (ref 0.50–1.10)
GFR calc Af Amer: >90 mL/min (ref >90)
GFR calc non Af Amer: 84 mL/min — ABNORMAL LOW (ref >90)

## 2012-07-08 LAB — CBC
MCH: 28.7 pg (ref 26.0–34.0)
MCHC: 32.6 g/dL (ref 30.0–36.0)
MCV: 88 fL (ref 78.0–100.0)
Platelets: 301 10*3/uL (ref 150–400)
RDW: 14.2 % (ref 11.5–15.5)

## 2012-07-08 MED ORDER — DM-GUAIFENESIN ER 30-600 MG PO TB12
1.0000 | ORAL_TABLET | Freq: Once | ORAL | Status: AC
Start: 2012-07-08 — End: 2012-07-08
  Administered 2012-07-08: 1 via ORAL
  Filled 2012-07-08: qty 1

## 2012-07-08 NOTE — Progress Notes (Signed)
Occupational Therapy Treatment Patient Details Name: Paige Higgins MRN: [id number] DOB: 1946/04/17 Today's Date: 07/08/2012 Time: 7829-5621 OT Time Calculation (min): 23 min  OT Assessment / Plan / Recommendation Comments on Treatment Session Excellent participation with therapy, Verbally expresses understanding of protocol. Asked for this therapist to return this pm if able.    Follow Up Recommendations  Outpatient OT;Other (comment) (as rec by Dr. Ave Filter)    Barriers to Discharge  None    Equipment Recommendations  None recommended by OT    Recommendations for Other Services    Frequency Min 3X/week   Plan Discharge plan remains appropriate    Precautions / Restrictions Precautions Precautions: Shoulder Type of Shoulder Precautions: No ROM L shoulder Precaution Booklet Issued: Yes (comment) Required Braces or Orthoses: Other Brace/Splint (sling at all times with exception of ADL and ex) Restrictions Weight Bearing Restrictions: Yes LUE Weight Bearing: Non weight bearing   Pertinent Vitals/Pain 2. Ice applied    ADL  Eating/Feeding: Performed;Set up Where Assessed - Eating/Feeding: Chair Grooming: Performed;Moderate assistance Where Assessed - Grooming: Supported standing Upper Body Bathing: Simulated;Moderate assistance Where Assessed - Upper Body Bathing: Supported sit to stand Lower Body Bathing: Simulated;Minimal assistance Where Assessed - Lower Body Bathing: Supported sit to stand Upper Body Dressing: Performed;Moderate assistance Where Assessed - Upper Body Dressing: Unsupported sit to stand (educated on technique for dressing and bathing) Lower Body Dressing: Performed Where Assessed - Lower Body Dressing: Supported sit to Pharmacist, hospital: Research scientist (life sciences) Method: Sit to Barista: Comfort height toilet Toileting - Clothing Manipulation and Hygiene: Performed;Modified independent Where Assessed -  Toileting Clothing Manipulation and Hygiene: Sit to stand from 3-in-1 or toilet Transfers/Ambulation Related to ADLs: supervision ADL Comments: Pt able to verbalize correct technique for donning LUE in sleeve and sling first. Somewhat limited by pain. Educated pt on importance on correct positioning and use of ice for edema and analgesic affect. Pt able to verbalize correct positioning of arm in sling. Written information reviewed and highlighted.    OT Diagnosis: Generalized weakness;Acute pain  OT Problem List: Decreased strength;Decreased range of motion;Decreased knowledge of use of DME or AE;Decreased knowledge of precautions;Impaired UE functional use;Pain OT Treatment Interventions: Self-care/ADL training;Therapeutic exercise;Therapeutic activities;Patient/family education   OT Goals Acute Rehab OT Goals OT Goal Formulation: With patient Time For Goal Achievement: 07/15/12 Potential to Achieve Goals: Good ADL Goals Pt Will Perform Grooming: with min assist;with caregiver independent in assisting;Standing at sink;Unsupported ADL Goal: Grooming - Progress: Progressing toward goals Pt Will Perform Upper Body Bathing: with caregiver independent in assisting;with min assist;Standing at sink;Unsupported;with cueing (comment type and amount) ADL Goal: Upper Body Bathing - Progress: Progressing toward goals Pt Will Perform Upper Body Dressing: with min assist;with caregiver independent in assisting;Unsupported ADL Goal: Upper Body Dressing - Progress: Progressing toward goals Additional ADL Goal #1: Pt/family will donn/doff sling independently ADL Goal: Additional Goal #1 - Progress: Progressing toward goals Arm Goals Pt Will Perform AROM: Independently;1 set;Left upper extremity;to maintain range of motion;Other (comment) Arm Goal: AROM - Progress: Met Additional Arm Goal #1: Pt will be independent with educating family on correct positioning of LUE in sitting and supine Arm Goal:  Additional Goal #1 - Progress: Progressing toward goals  Visit Information  Last OT Received On: 07/08/12 Assistance Needed: +1    Subjective Data   thank you so much   Prior Functioning  Home Living Lives With: Spouse Available Help at Discharge: Family Type of Home: House Home Access: Stairs to  enter Secretary/administrator of Steps: 2 Entrance Stairs-Rails: Right Home Layout: One level Bathroom Shower/Tub: Engineer, manufacturing systems: Standard Bathroom Accessibility: Yes How Accessible: Accessible via walker Home Adaptive Equipment: Walker - rolling;Straight cane Prior Function Level of Independence: Independent Able to Take Stairs?: Yes Driving: Yes Vocation: Part time employment Communication Communication: No difficulties Dominant Hand: Right    Cognition  Overall Cognitive Status: Appears within functional limits for tasks assessed/performed Arousal/Alertness: Awake/alert Orientation Level: Appears intact for tasks assessed Behavior During Session: [facility] for tasks performed    Mobility Bed Mobility Bed Mobility: Rolling Right;Right Sidelying to Sit Rolling Right: 4: Min assist;With rail Right Sidelying to Sit: 4: Min assist Details for Bed Mobility Assistance: limited by pain Transfers Transfers: Sit to Stand;Stand to Sit Sit to Stand: 6: Modified independent (Device/Increase time) Stand to Sit: 6: Modified independent (Device/Increase time)   Exercises General Exercises - Upper Extremity Elbow Flexion: AROM;Left;10 reps;Standing Elbow Extension: AROM;10 reps;Left;Standing Wrist Flexion: AROM;10 reps;Left;Seated Wrist Extension: AROM;Left;10 reps;Seated Digit Composite Flexion: AROM;10 reps;Left;Seated Composite Extension: AROM;10 reps;Left;Seated  Balance    End of Session OT - End of Session Activity Tolerance: Patient tolerated treatment well Patient left: in chair;with call bell/phone within reach;with nursing in room Nurse Communication: Mobility  status;Weight bearing status  GO     Anshu Wehner,HILLARY 07/08/2012, 10:20 AM Luisa Dago, OTR/L  [phone] 07/08/2012

## 2012-07-08 NOTE — Progress Notes (Signed)
Occupational Therapy Evaluation Patient Details Name: Paige Higgins MRN: [id number] DOB: [birth year] Today's Date: 07/08/2012 Time: 9562-1308 OT Time Calculation (min): 20 min  OT Assessment / Plan / Recommendation Clinical Impression  66 yo s/p L reverse TSA. PT will benefit from skilled OT to max independence with ADL and educate on shoulder protocol prior to D/C. No HHOT needed afterD/C.     OT Assessment  Patient needs continued OT Services    Follow Up Recommendations  Outpatient OT;Other (comment) (as recommended by Dr. Ave Filter)    Barriers to Discharge None    Equipment Recommendations  None recommended by OT    Recommendations for Other Services    Frequency  Min 3X/week    Precautions / Restrictions Precautions Precautions: Shoulder Type of Shoulder Precautions: No ROM L shoulder Precaution Booklet Issued: Yes (comment) Required Braces or Orthoses: Other Brace/Splint (sling at all times with exception of ADL and ex) Restrictions Weight Bearing Restrictions: Yes LUE Weight Bearing: Non weight bearing   Pertinent Vitals/Pain 1    ADL  Eating/Feeding: Performed;Set up Where Assessed - Eating/Feeding: Chair Grooming: Performed;Moderate assistance Where Assessed - Grooming: Supported standing Upper Body Bathing: Simulated;Moderate assistance Where Assessed - Upper Body Bathing: Supported sit to stand Lower Body Bathing: Simulated;Minimal assistance Where Assessed - Lower Body Bathing: Supported sit to stand Upper Body Dressing: Maximal assistance;Performed Where Assessed - Upper Body Dressing: Unsupported sitting Lower Body Dressing: Performed Where Assessed - Lower Body Dressing: Supported sit to Pharmacist, hospital: Research scientist (life sciences) Method: Sit to Barista: Comfort height toilet Toileting - Clothing Manipulation and Hygiene: Performed;Modified independent Where Assessed - Toileting Clothing Manipulation and  Hygiene: Sit to stand from 3-in-1 or toilet Transfers/Ambulation Related to ADLs: supervision ADL Comments: Education regarding ADL following shoulder precautions    OT Diagnosis: Generalized weakness;Acute pain  OT Problem List: Decreased strength;Decreased range of motion;Decreased knowledge of use of DME or AE;Decreased knowledge of precautions;Impaired UE functional use;Pain OT Treatment Interventions: Self-care/ADL training;Therapeutic exercise;Therapeutic activities;Patient/family education   OT Goals Acute Rehab OT Goals OT Goal Formulation: With patient Time For Goal Achievement: 07/15/12 Potential to Achieve Goals: Good ADL Goals Pt Will Perform Grooming: with min assist;with caregiver independent in assisting;Standing at sink;Unsupported ADL Goal: Grooming - Progress: Goal set today Pt Will Perform Upper Body Bathing: with caregiver independent in assisting;with min assist;Standing at sink;Unsupported;with cueing (comment type and amount) ADL Goal: Upper Body Bathing - Progress: Goal set today Pt Will Perform Upper Body Dressing: with min assist;with caregiver independent in assisting;Unsupported ADL Goal: Upper Body Dressing - Progress: Goal set today Additional ADL Goal #1: Pt/family will donn/doff sling independently ADL Goal: Additional Goal #1 - Progress: Goal set today Arm Goals Pt Will Perform AROM: Independently;1 set;Left upper extremity;to maintain range of motion;Other (comment) (E/W/H only. No shoulder ROM.) Arm Goal: AROM - Progress: Goal set today Additional Arm Goal #1: Pt will be independent with educating family on correct positioning of LUE in sitting and supine Arm Goal: Additional Goal #1 - Progress: Goal set today  Visit Information  Last OT Received On: 07/08/12 Assistance Needed: +1    Subjective Data      Prior Functioning  Vision/Perception  Home Living Lives With: Spouse Available Help at Discharge: Family Type of Home: House Home Access:  Stairs to enter Secretary/administrator of Steps: 2 Entrance Stairs-Rails: Right Home Layout: One level Bathroom Shower/Tub: Engineer, manufacturing systems: Standard Bathroom Accessibility: Yes How Accessible: Accessible via walker Home Adaptive Equipment: Walker - rolling;Straight  cane Prior Function Level of Independence: Independent Able to Take Stairs?: Yes Driving: Yes Vocation: Part time employment Communication Communication: No difficulties Dominant Hand: Right      Cognition  Overall Cognitive Status: Appears within functional limits for tasks assessed/performed Arousal/Alertness: Awake/alert Orientation Level: Appears intact for tasks assessed Behavior During Session: [facility] for tasks performed    Extremity/Trunk Assessment Right Upper Extremity Assessment RUE ROM/Strength/Tone: Within functional levels RUE Sensation: WFL - Light Touch;WFL - Proprioception RUE Coordination: WFL - gross/fine motor Left Upper Extremity Assessment LUE ROM/Strength/Tone: Deficits LUE ROM/Strength/Tone Deficits: AROM [facility]/W/[facility]. No shoulder ROM LUE Sensation: WFL - Light Touch;WFL - Proprioception LUE Coordination: WFL - fine motor;Deficits LUE Coordination Deficits: gross motor` Right Lower Extremity Assessment RLE ROM/Strength/Tone: Within functional levels (s/p B TKA) Left Lower Extremity Assessment LLE ROM/Strength/Tone: Within functional levels Trunk Assessment Trunk Assessment: Normal   Mobility Bed Mobility Bed Mobility: Rolling Right;Right Sidelying to Sit Rolling Right: 4: Min assist;With rail Right Sidelying to Sit: 4: Min assist Details for Bed Mobility Assistance: limited by pain Transfers Transfers: Sit to Stand;Stand to Sit Sit to Stand: 5: Supervision Stand to Sit: 5: Supervision      Balance  WFL  End of Session OT - End of Session Activity Tolerance: Patient tolerated treatment well Patient left: in chair;with call bell/phone within reach;with nursing in  room Nurse Communication: Mobility status;Weight bearing status  GO     Tymel Conely,HILLARY 07/08/2012, 10:12 AM Luisa Dago, OTR/L  [phone] 07/08/2012

## 2012-07-08 NOTE — Progress Notes (Signed)
UR COMPLETED  

## 2012-07-08 NOTE — Progress Notes (Signed)
CARE MANAGEMENT NOTE 07/08/2012  Patient:  Paige Higgins, Paige Higgins   Account Number:  [id number]  Date Initiated:  07/08/2012  Documentation initiated by:  Vance Peper  Subjective/Objective Assessment:   66 yr old female s/p reverseshoulder arthroplasty     Action/Plan:   CM spoke with patient. She has no home health needs at discharge. Has supportive husband and  family.   Anticipated DC Date:  07/09/2012   Anticipated DC Plan:  HOME/SELF CARE      DC Planning Services  CM consult      PAC Choice  NA   Choice offered to / List presented to:     DME arranged  NA      DME agency  NA     HH arranged  NA      HH agency  NA   Status of service:  Completed, signed off  Discharge Disposition:  HOME/SELF CARE

## 2012-07-08 NOTE — Progress Notes (Signed)
PATIENT ID: Paige Higgins   1 Day Post-Op Procedure(s) (LRB): REVERSE SHOULDER ARTHROPLASTY (Left)  Subjective: Doing well, pain controlled with oral and IV meds. Block has worn off.  No N/T.  No CP/SOB.  Objective:  Filed Vitals:   07/08/12 0543  BP: 126/67  Pulse: 102  Temp: 99.1 F (37.3 C)  Resp: 18     LUE dressing c/d/i.  Drain d/c'd.   NSTLT m/u/r.  AX /MC intact. 2+ rad pulse.  Labs:  No results found for this basename: HGB:5 in the last 72 hoursNo results found for this basename: WBC:2,RBC:2,HCT:2,PLT:2 in the last 72 hoursNo results found for this basename: NA:2,K:2,CL:2,CO2:2,BUN:2,CREATININE:2,GLUCOSE:2,CALCIUM:2 in the last 72 hours  Assessment and Plan:POD 1 reverse TSA D/c IVF Transition to oral pain meds OT today H/W/E motion, sling ed. Dressing change tomorrow and d/c home. Labs pending.  VTE proph: SCDs / ASA

## 2012-07-09 ENCOUNTER — Encounter (HOSPITAL_COMMUNITY): Payer: Self-pay | Admitting: Orthopedic Surgery

## 2012-07-09 DIAGNOSIS — M19019 Primary osteoarthritis, unspecified shoulder: Secondary | ICD-10-CM

## 2012-07-09 MED ORDER — OXYCODONE-ACETAMINOPHEN 5-325 MG PO TABS: 1.0000 | ORAL_TABLET | ORAL | Status: AC | PRN

## 2012-07-09 NOTE — Discharge Summary (Signed)
Patient ID: Paige Higgins MRN: [id number] DOB/AGE: [birth year] 66 y.o.  Admit date: 07/07/2012 Discharge date: 07/09/2012  Admission Diagnoses:  Principal Problem:  *Primary localized osteoarthrosis, shoulder region   Discharge Diagnoses:  Same  Past Medical History  Diagnosis Date  . PONV (postoperative nausea and vomiting)   . Hypertension   . Sleep apnea     sleep Study Normal  . Chronic kidney disease     hx. UTI  . Headache   . Arthritis     Surgeries: Procedure(s): REVERSE SHOULDER ARTHROPLASTY on 07/07/2012   Consultants:    Discharged Condition: Improved  Hospital Course: Paige Higgins is an 66 y.o. female who was admitted 07/07/2012 for operative treatment ofPrimary localized osteoarthrosis, shoulder region. Patient has severe unremitting pain that affects sleep, daily activities, and work/hobbies. After pre-op clearance the patient was taken to the operating room on 07/07/2012 and underwent  Procedure(s): REVERSE SHOULDER ARTHROPLASTY.    Patient was given perioperative antibiotics: Anti-infectives     Start     Dose/Rate Route Frequency Ordered Stop   07/07/12 1700   ceFAZolin (ANCEF) IVPB 1 g/50 mL premix        1 g 100 mL/hr over 30 Minutes Intravenous Every 6 hours 07/07/12 1537 07/08/12 0558   07/06/12 1435   ceFAZolin (ANCEF) IVPB 2 g/50 mL premix        2 g 100 mL/hr over 30 Minutes Intravenous 60 min pre-op 07/06/12 1435 07/07/12 1105           Patient was given sequential compression devices, early ambulation, and chemoprophylaxis to prevent DVT.  Patient benefited maximally from hospital stay and there were no complications.    Recent vital signs: Patient Vitals for the past 24 hrs:  BP Temp Temp src Pulse Resp SpO2  07/09/12 0525 123/69 mmHg 99.1 F (37.3 C) Oral 83  18  95 %  07/08/12 2146 134/66 mmHg 98.2 F (36.8 C) - 96  18  96 %     Recent laboratory studies:  Basename 07/08/12 0640  WBC 14.9*  HGB 12.5  HCT 38.3  PLT 301  NA  136  K 3.7  CL 98  CO2 25  BUN 14  CREATININE 0.79  GLUCOSE 116*  INR --  CALCIUM 8.5     Discharge Medications:   Medication List  As of 07/09/2012  7:15 AM   STOP taking these medications         HYDROcodone-acetaminophen 5-325 MG per tablet         TAKE these medications         albuterol 108 (90 BASE) MCG/ACT inhaler   Commonly known as: PROVENTIL HFA;VENTOLIN HFA   Inhale 2 puffs into the lungs every 4 (four) hours as needed. For wheezing.      albuterol (2.5 MG/3ML) 0.083% nebulizer solution   Commonly known as: PROVENTIL   Take 2.5 mg by nebulization every 6 (six) hours as needed. For shortness of breath or wheezing.      atorvastatin 40 MG tablet   Commonly known as: LIPITOR   Take 40 mg by mouth daily.      celecoxib 200 MG capsule   Commonly known as: CELEBREX   Take 200-400 mg by mouth daily as needed. For pain.      chlorhexidine 0.12 % solution   Commonly known as: PERIDEX   Use as directed 15 mLs in the mouth or throat 2 (two) times daily. Rinse and spit.  fluticasone 50 MCG/ACT nasal spray   Commonly known as: FLONASE   Place 2 sprays into the nose daily as needed. For congestion.      ipratropium 0.06 % nasal spray   Commonly known as: ATROVENT   Place 2 sprays into the nose 2 (two) times daily.      oxyCODONE-acetaminophen 5-325 MG per tablet   Commonly known as: PERCOCET/ROXICET   Take 1-2 tablets by mouth every 4 (four) hours as needed for pain.      SUMAtriptan 50 MG tablet   Commonly known as: IMITREX   Take 50 mg by mouth every 2 (two) hours as needed. For migraine.      tretinoin 0.025 % cream   Commonly known as: RETIN-A   Apply 1 application topically at bedtime.      triamterene-hydrochlorothiazide 37.5-25 MG per tablet   Commonly known as: MAXZIDE-25   Take 1 tablet by mouth daily.            Diagnostic Studies: Dg Chest 2 View  06/30/2012  *RADIOLOGY REPORT*  Clinical Data: Preop shoulder surgery  CHEST - 2 VIEW   Comparison: None.  Findings: Cardiac enlargement without heart failure.  Negative for infiltrate or effusion.  Motion is present on the lateral view causing blurring of anatomy.  Negative for mass lesion  IMPRESSION: Cardiac enlargement.  No active cardiopulmonary disease.  Original Report Authenticated By: Camelia Phenes, M.D.   Dg Shoulder Left Port  07/07/2012  *RADIOLOGY REPORT*  Clinical Data: Postop left shoulder replacement.  PORTABLE LEFT SHOULDER - 2+ VIEW  Comparison: Chest radiograph 06/30/2012  Findings: Single view demonstrates a total left shoulder arthroplasty.  No evidence for a periprosthetic fracture.  Left AC joint appears intact.  Decreased volume in the left lung.  Negative for a large pneumothorax.  IMPRESSION: Shoulder arthroplasty without complication.  Original Report Authenticated By: Richarda Overlie, M.D.    Disposition: Final discharge disposition not confirmed  Discharge Orders    Future Orders Please Complete By Expires   Diet - low sodium heart healthy      Call MD / Call 911      Comments:   If you experience chest pain or shortness of breath, CALL 911 and be transported to the hospital emergency room.  If you develope a fever above 101 F, pus (white drainage) or increased drainage or redness at the wound, or calf pain, call your surgeon's office.   Constipation Prevention      Comments:   Drink plenty of fluids.  Prune juice may be helpful.  You may use a stool softener, such as Colace (over the counter) 100 mg twice a day.  Use MiraLax (over the counter) for constipation as needed.   Increase activity slowly as tolerated      Driving restrictions      Comments:   No driving for 4 weeks      Follow-up Information    Follow up with Mable Paris, MD in 2 weeks.   Contact information:   [facility] [address], [address] [place] [address] [phone]           Signed: Mable Paris 07/09/2012, 7:15 AM

## 2012-07-09 NOTE — Progress Notes (Signed)
Occupational Therapy Treatment and Discharge Patient Details Name: Paige Higgins MRN: [id number] DOB: August 14, 1946 Today's Date: 07/09/2012 Time: 4098-1191 OT Time Calculation (min): 29 min  OT Assessment / Plan / Recommendation Comments on Treatment Session Pt with excellent progress and all education completed. Follow up with MD for further therapy.    Follow Up Recommendations   (as per Dr. Ave Filter)    Barriers to Discharge       Equipment Recommendations  None recommended by OT    Recommendations for Other Services    Frequency Min 3X/week   Plan Discharge plan remains appropriate    Precautions / Restrictions Precautions Precautions: Shoulder Type of Shoulder Precautions: No ROM L shoulder Required Braces or Orthoses: Other Brace/Splint Other Brace/Splint: Sling on at all times with exception of ADL and elbow, wrist, hand exercises Restrictions Weight Bearing Restrictions: Yes LUE Weight Bearing: Non weight bearing   Pertinent Vitals/Pain 5/10 left shoulder with arm out of sling and post elbow exercises    ADL  Upper Body Dressing: Performed;Moderate assistance Where Assessed - Upper Body Dressing: Unsupported sit to stand ADL Comments: Went over with patient and husband elbow, wrist, hand exercises; don/doff sling with pt at Mod A level using counter top to help; use of thumb hole loop,;method of donning shirt (button up); method of sponge bathing under the arm, use of solid unscented deodorant and/or wash cloth placed in the arm pit; positioning of slling; positioning of arm when laying down or sitting.    OT Diagnosis:    OT Problem List:   OT Treatment Interventions:     OT Goals ADL Goals ADL Goal: Upper Body Bathing - Progress: Met (for technique) ADL Goal: Upper Body Dressing - Progress: Progressing toward goals (caregiver will be able to A her) ADL Goal: Additional Goal #1 - Progress: Progressing toward goals (caregiver will be able to A her) Arm Goals Arm  Goal: AROM - Progress: Met Arm Goal: Additional Goal #1 - Progress: Met  Visit Information  Last OT Received On: 07/09/12 Assistance Needed: +1    Subjective Data  Subjective: My husband will be there to help me when I need it   Prior Functioning       Cognition  Overall Cognitive Status: Appears within functional limits for tasks assessed/performed Arousal/Alertness: Awake/alert Orientation Level: Appears intact for tasks assessed Behavior During Session: [facility] for tasks performed    Mobility Bed Mobility Bed Mobility: Supine to Sit Supine to Sit: 4: Min assist;HOB elevated (40 degrees) Transfers Transfers: Sit to Stand;Stand to Sit Sit to Stand: 6: Modified independent (Device/Increase time);With upper extremity assist;From bed (RUE) Stand to Sit: 7: Independent;Without upper extremity assist;To bed   Exercises Other Exercises Other Exercises: 10 repetitions of elbow flex/extension with palm facing body with instructions to do this  3-5x/day 10 reps each time  Balance    End of Session OT - End of Session Equipment Utilized During Treatment:  (shoulder immobilizer) Activity Tolerance: Patient tolerated treatment well Patient left: in bed;with call bell/phone within reach;with family/visitor present (husband)       Evette Georges [phone] 07/09/2012, 9:36 AM

## 2012-07-09 NOTE — Progress Notes (Signed)
PATIENT ID: Paige Higgins   2 Days Post-Op Procedure(s) (LRB): REVERSE SHOULDER ARTHROPLASTY (Left)  Subjective: Doing well, pain well controlled with percocet.  Did well with OT.  Objective:  Filed Vitals:   07/09/12 0525  BP: 123/69  Pulse: 83  Temp: 99.1 F (37.3 C)  Resp: 18     L shoulder dressing d/c'd.  Inc c/d/i. NVID.  Labs:   [facility] 07/08/12 0640  HGB 12.5   Basename 07/08/12 0640  WBC 14.9*  RBC 4.35  HCT 38.3  PLT 301   Basename 07/08/12 0640  NA 136  K 3.7  CL 98  CO2 25  BUN 14  CREATININE 0.79  GLUCOSE 116*  CALCIUM 8.5    Assessment and Plan:Doing well. D/c home today F/u 2 wks  VTE proph: ecasa, SCDs

## 2012-12-10 ENCOUNTER — Ambulatory Visit: Payer: Self-pay | Admitting: Family Medicine

## 2013-02-01 ENCOUNTER — Ambulatory Visit: Payer: Self-pay | Admitting: Orthopedic Surgery

## 2013-02-04 ENCOUNTER — Ambulatory Visit: Payer: Self-pay | Admitting: Pain Medicine

## 2013-03-02 ENCOUNTER — Ambulatory Visit: Payer: Self-pay | Admitting: Otolaryngology

## 2013-03-16 ENCOUNTER — Ambulatory Visit: Payer: Self-pay | Admitting: Otolaryngology

## 2013-12-29 ENCOUNTER — Ambulatory Visit: Payer: Self-pay | Admitting: Pain Medicine

## 2014-02-10 ENCOUNTER — Ambulatory Visit: Payer: Self-pay | Admitting: Pain Medicine

## 2014-04-05 ENCOUNTER — Ambulatory Visit: Payer: Self-pay | Admitting: Unknown Physician Specialty

## 2014-04-06 ENCOUNTER — Ambulatory Visit: Payer: Self-pay | Admitting: Pain Medicine

## 2014-04-07 ENCOUNTER — Emergency Department: Payer: Self-pay | Admitting: Internal Medicine

## 2014-04-07 ENCOUNTER — Ambulatory Visit: Payer: Self-pay | Admitting: Pain Medicine

## 2014-04-15 ENCOUNTER — Other Ambulatory Visit: Payer: Self-pay | Admitting: Neurosurgery

## 2014-04-15 ENCOUNTER — Ambulatory Visit: Payer: Self-pay | Admitting: Neurosurgery

## 2014-04-21 NOTE — Pre-Procedure Instructions (Signed)
Veneta PentonLinda B Eckles  04/21/2014   Your procedure is scheduled on:  04-28-2014   Thursday   [facility]45 [facility]   Call this number if you have problems the morning of surgery: [phone]   Remember:   Do not eat food or drink liquids after midnight.    Take these medicines the morning of surgery with A SIP OF WATER: inhalers as needed,Atrovent nasal spray     Do not wear jewelry, make-up or nail polish.  Do not wear lotions, powders, or perfumes.   Do not shave 48 hours prior to surgery.   Do not bring valuables to the hospital.  [facility] not [facility] for any belongings or valuables.               Contacts, dentures or bridgework may not be worn into surgery.   Leave suitcase in the car. After surgery it may be brought to your room.  For patients admitted to the hospital, discharge time is determined by your treatment team.               Patients discharged the day of surgery will not be allowed to drive home.   Special Instructions: See attached sheet for instructions on CHG shower/bath   Please read over the following fact sheets that you were given: Pain Booklet and Surgical Site Infection Prevention

## 2014-04-22 ENCOUNTER — Encounter (HOSPITAL_COMMUNITY): Payer: Self-pay | Admitting: Pharmacy Technician

## 2014-04-22 ENCOUNTER — Encounter (HOSPITAL_COMMUNITY): Payer: Self-pay

## 2014-04-22 ENCOUNTER — Encounter (HOSPITAL_COMMUNITY)
Admission: RE | Admit: 2014-04-22 | Discharge: 2014-04-22 | Disposition: A | Discharge status: Home / Self Care | Payer: Medicare Other | Source: Ambulatory Visit | Attending: Neurosurgery | Admitting: Neurosurgery

## 2014-04-22 ENCOUNTER — Encounter (HOSPITAL_COMMUNITY)
Admission: RE | Admit: 2014-04-22 | Discharge: 2014-04-22 | Disposition: A | Discharge status: Home / Self Care | Payer: Medicare Other | Source: Ambulatory Visit | Attending: Anesthesiology | Admitting: Anesthesiology

## 2014-04-22 DIAGNOSIS — Z01812 Encounter for preprocedural laboratory examination: Secondary | ICD-10-CM | POA: Insufficient documentation

## 2014-04-22 DIAGNOSIS — Z01818 Encounter for other preprocedural examination: Secondary | ICD-10-CM | POA: Insufficient documentation

## 2014-04-22 DIAGNOSIS — I517 Cardiomegaly: Secondary | ICD-10-CM | POA: Insufficient documentation

## 2014-04-22 DIAGNOSIS — I10 Essential (primary) hypertension: Secondary | ICD-10-CM | POA: Insufficient documentation

## 2014-04-22 HISTORY — DX: Major depressive disorder, single episode, unspecified: F32.9

## 2014-04-22 HISTORY — DX: Depression, unspecified: F32.A

## 2014-04-22 HISTORY — DX: Hyperlipidemia, unspecified: E78.5

## 2014-04-22 LAB — BASIC METABOLIC PANEL
BUN: 15 mg/dL (ref 6–23)
CALCIUM: 9.7 mg/dL (ref 8.4–10.5)
CO2: 28 meq/L (ref 19–32)
CREATININE: 0.83 mg/dL (ref 0.50–1.10)
Chloride: 101 mEq/L (ref 96–112)
GFR calc Af Amer: 82 mL/min — ABNORMAL LOW (ref >90)
GFR calc non Af Amer: 71 mL/min — ABNORMAL LOW (ref >90)
Glucose, Bld: 92 mg/dL (ref 70–99)
Potassium: 5.1 mEq/L (ref 3.7–5.3)
Sodium: 141 mEq/L (ref 137–147)

## 2014-04-22 LAB — CBC
HCT: 42.9 % (ref 36.0–46.0)
Hemoglobin: 13.9 g/dL (ref 12.0–15.0)
MCH: 29 pg (ref 26.0–34.0)
MCHC: 32.4 g/dL (ref 30.0–36.0)
MCV: 89.6 fL (ref 78.0–100.0)
PLATELETS: 322 10*3/uL (ref 150–400)
RBC: 4.79 MIL/uL (ref 3.87–5.11)
RDW: 14.8 % (ref 11.5–15.5)
WBC: 9.6 10*3/uL (ref 4.0–10.5)

## 2014-04-22 LAB — SURGICAL PCR SCREEN
MRSA, PCR: NEGATIVE
Staphylococcus aureus: NEGATIVE

## 2014-04-22 NOTE — Progress Notes (Signed)
[facility]ek[facility],stress [facility],echo.  Sleep study requested [facility] Robby [facility]ermonHendricks [facility].

## 2014-04-27 MED ORDER — CEFAZOLIN SODIUM-DEXTROSE 2-3 GM-% IV SOLR
2.0000 g | INTRAVENOUS | Status: AC
  Administered 2014-04-28: 2 g via INTRAVENOUS
  Filled 2014-04-27: qty 50

## 2014-04-28 ENCOUNTER — Inpatient Hospital Stay (HOSPITAL_COMMUNITY)
Admission: RE | Admit: 2014-04-28 | Discharge: 2014-04-30 | DRG: 520 | Disposition: A | Discharge status: Home / Self Care | Payer: Medicare Other | Source: Ambulatory Visit | Attending: Neurosurgery | Admitting: Neurosurgery

## 2014-04-28 ENCOUNTER — Inpatient Hospital Stay (HOSPITAL_COMMUNITY): Payer: Medicare Other

## 2014-04-28 ENCOUNTER — Inpatient Hospital Stay (HOSPITAL_COMMUNITY): Payer: Medicare Other | Admitting: Certified Registered Nurse Anesthetist

## 2014-04-28 ENCOUNTER — Encounter (HOSPITAL_COMMUNITY): Payer: Medicare Other | Admitting: Certified Registered Nurse Anesthetist

## 2014-04-28 ENCOUNTER — Encounter (HOSPITAL_COMMUNITY): Payer: Self-pay | Admitting: *Deleted

## 2014-04-28 ENCOUNTER — Encounter (HOSPITAL_COMMUNITY)
Admission: RE | Disposition: A | Discharge status: Home / Self Care | Payer: Self-pay | Source: Ambulatory Visit | Attending: Neurosurgery

## 2014-04-28 DIAGNOSIS — M48062 Spinal stenosis, lumbar region with neurogenic claudication: Secondary | ICD-10-CM | POA: Diagnosis present

## 2014-04-28 DIAGNOSIS — I129 Hypertensive chronic kidney disease with stage 1 through stage 4 chronic kidney disease, or unspecified chronic kidney disease: Secondary | ICD-10-CM | POA: Diagnosis present

## 2014-04-28 DIAGNOSIS — Z96659 Presence of unspecified artificial knee joint: Secondary | ICD-10-CM

## 2014-04-28 DIAGNOSIS — M7138 Other bursal cyst, other site: Secondary | ICD-10-CM | POA: Diagnosis present

## 2014-04-28 DIAGNOSIS — M129 Arthropathy, unspecified: Secondary | ICD-10-CM | POA: Diagnosis present

## 2014-04-28 DIAGNOSIS — M7130 Other bursal cyst, unspecified site: Principal | ICD-10-CM | POA: Diagnosis present

## 2014-04-28 DIAGNOSIS — Z96619 Presence of unspecified artificial shoulder joint: Secondary | ICD-10-CM

## 2014-04-28 DIAGNOSIS — Q762 Congenital spondylolisthesis: Secondary | ICD-10-CM

## 2014-04-28 DIAGNOSIS — Z79899 Other long term (current) drug therapy: Secondary | ICD-10-CM

## 2014-04-28 DIAGNOSIS — N189 Chronic kidney disease, unspecified: Secondary | ICD-10-CM | POA: Diagnosis present

## 2014-04-28 DIAGNOSIS — Z87891 Personal history of nicotine dependence: Secondary | ICD-10-CM

## 2014-04-28 DIAGNOSIS — Z7982 Long term (current) use of aspirin: Secondary | ICD-10-CM

## 2014-04-28 DIAGNOSIS — E785 Hyperlipidemia, unspecified: Secondary | ICD-10-CM | POA: Diagnosis present

## 2014-04-28 HISTORY — PX: LUMBAR LAMINECTOMY/DECOMPRESSION MICRODISCECTOMY: SHX5026

## 2014-04-28 SURGERY — LUMBAR LAMINECTOMY/DECOMPRESSION MICRODISCECTOMY 2 LEVELS
Anesthesia: General

## 2014-04-28 MED ORDER — ACETAMINOPHEN 325 MG PO TABS: 650.0000 mg | ORAL_TABLET | ORAL | Status: DC | PRN

## 2014-04-28 MED ORDER — MENTHOL 3 MG MT LOZG
1.0000 | LOZENGE | OROMUCOSAL | Status: DC | PRN
  Filled 2014-04-28: qty 9

## 2014-04-28 MED ORDER — PHENYLEPHRINE HCL 10 MG/ML IJ SOLN
10.0000 mg | INTRAVENOUS | Status: DC | PRN
  Administered 2014-04-28: 25 ug/min via INTRAVENOUS

## 2014-04-28 MED ORDER — GLYCOPYRROLATE 0.2 MG/ML IJ SOLN
INTRAMUSCULAR | Status: DC | PRN
  Administered 2014-04-28: 0.6 mg via INTRAVENOUS

## 2014-04-28 MED ORDER — MIDAZOLAM HCL 2 MG/2ML IJ SOLN
INTRAMUSCULAR | Status: AC
  Filled 2014-04-28: qty 2

## 2014-04-28 MED ORDER — ALBUTEROL SULFATE (2.5 MG/3ML) 0.083% IN NEBU
2.5000 mg | INHALATION_SOLUTION | Freq: Four times a day (QID) | RESPIRATORY_TRACT | Status: DC | PRN
  Administered 2014-04-29 – 2014-04-30 (×2): 2.5 mg via RESPIRATORY_TRACT
  Filled 2014-04-28 (×2): qty 3

## 2014-04-28 MED ORDER — HYDROMORPHONE HCL PF 1 MG/ML IJ SOLN: 0.2500 mg | INTRAMUSCULAR | Status: DC | PRN

## 2014-04-28 MED ORDER — ONDANSETRON HCL 4 MG/2ML IJ SOLN
INTRAMUSCULAR | Status: AC
  Filled 2014-04-28: qty 2

## 2014-04-28 MED ORDER — LACTATED RINGERS IV SOLN
INTRAVENOUS | Status: DC
  Administered 2014-04-28: 18:00:00 via INTRAVENOUS

## 2014-04-28 MED ORDER — SUCCINYLCHOLINE CHLORIDE 20 MG/ML IJ SOLN
INTRAMUSCULAR | Status: DC | PRN
  Administered 2014-04-28: 100 mg via INTRAVENOUS

## 2014-04-28 MED ORDER — 0.9 % SODIUM CHLORIDE (POUR BTL) OPTIME
TOPICAL | Status: DC | PRN
  Administered 2014-04-28: 1000 mL

## 2014-04-28 MED ORDER — ROCURONIUM BROMIDE 50 MG/5ML IV SOLN
INTRAVENOUS | Status: AC
  Filled 2014-04-28: qty 1

## 2014-04-28 MED ORDER — DEXAMETHASONE SODIUM PHOSPHATE 4 MG/ML IJ SOLN
INTRAMUSCULAR | Status: AC
  Filled 2014-04-28: qty 2

## 2014-04-28 MED ORDER — PROPOFOL 10 MG/ML IV BOLUS
INTRAVENOUS | Status: DC | PRN
  Administered 2014-04-28: 180 mg via INTRAVENOUS

## 2014-04-28 MED ORDER — THROMBIN 5000 UNITS EX SOLR
CUTANEOUS | Status: DC | PRN
  Administered 2014-04-28 (×2): 5000 [IU] via TOPICAL

## 2014-04-28 MED ORDER — TRIAMTERENE-HCTZ 37.5-25 MG PO TABS
1.0000 | ORAL_TABLET | Freq: Every day | ORAL | Status: DC
  Administered 2014-04-28 – 2014-04-30 (×3): 1 via ORAL
  Filled 2014-04-28 (×3): qty 1

## 2014-04-28 MED ORDER — OXYCODONE HCL 5 MG PO TABS: 5.0000 mg | ORAL_TABLET | ORAL | Status: DC | PRN

## 2014-04-28 MED ORDER — MIDAZOLAM HCL 5 MG/5ML IJ SOLN
INTRAMUSCULAR | Status: DC | PRN
  Administered 2014-04-28: 2 mg via INTRAVENOUS

## 2014-04-28 MED ORDER — BUPIVACAINE-EPINEPHRINE (PF) 0.5% -1:200000 IJ SOLN
INTRAMUSCULAR | Status: DC | PRN
  Administered 2014-04-28: 10 mL

## 2014-04-28 MED ORDER — PHENYLEPHRINE 40 MCG/ML (10ML) SYRINGE FOR IV PUSH (FOR BLOOD PRESSURE SUPPORT)
PREFILLED_SYRINGE | INTRAVENOUS | Status: AC
  Filled 2014-04-28: qty 20

## 2014-04-28 MED ORDER — CEFAZOLIN SODIUM-DEXTROSE 2-3 GM-% IV SOLR
2.0000 g | Freq: Three times a day (TID) | INTRAVENOUS | Status: AC
  Administered 2014-04-28 – 2014-04-29 (×2): 2 g via INTRAVENOUS
  Filled 2014-04-28 (×2): qty 50

## 2014-04-28 MED ORDER — ONDANSETRON HCL 4 MG/2ML IJ SOLN: 4.0000 mg | INTRAMUSCULAR | Status: DC | PRN

## 2014-04-28 MED ORDER — PROPOFOL 10 MG/ML IV BOLUS
INTRAVENOUS | Status: AC
  Filled 2014-04-28: qty 20

## 2014-04-28 MED ORDER — BACITRACIN ZINC 500 UNIT/GM EX OINT
TOPICAL_OINTMENT | CUTANEOUS | Status: DC | PRN
  Administered 2014-04-28: 1 via TOPICAL

## 2014-04-28 MED ORDER — FENTANYL CITRATE 0.05 MG/ML IJ SOLN
INTRAMUSCULAR | Status: AC
  Filled 2014-04-28: qty 5

## 2014-04-28 MED ORDER — HYDROCODONE-ACETAMINOPHEN 5-325 MG PO TABS
1.0000 | ORAL_TABLET | ORAL | Status: DC | PRN
Start: 2014-04-28 — End: 2014-04-30
  Administered 2014-04-28 – 2014-04-29 (×3): 2 via ORAL
  Filled 2014-04-28 (×3): qty 2

## 2014-04-28 MED ORDER — ADULT MULTIVITAMIN W/MINERALS CH
1.0000 | ORAL_TABLET | Freq: Every day | ORAL | Status: DC
  Administered 2014-04-29 – 2014-04-30 (×2): 1 via ORAL
  Filled 2014-04-28 (×2): qty 1

## 2014-04-28 MED ORDER — NEOSTIGMINE METHYLSULFATE 10 MG/10ML IV SOLN
INTRAVENOUS | Status: AC
  Filled 2014-04-28: qty 1

## 2014-04-28 MED ORDER — LIDOCAINE HCL (CARDIAC) 20 MG/ML IV SOLN
INTRAVENOUS | Status: AC
  Filled 2014-04-28: qty 5

## 2014-04-28 MED ORDER — EPHEDRINE SULFATE 50 MG/ML IJ SOLN
INTRAMUSCULAR | Status: DC | PRN
  Administered 2014-04-28: 10 mg via INTRAVENOUS

## 2014-04-28 MED ORDER — LACTATED RINGERS IV SOLN
INTRAVENOUS | Status: DC | PRN
  Administered 2014-04-28 (×2): via INTRAVENOUS

## 2014-04-28 MED ORDER — FENTANYL CITRATE 0.05 MG/ML IJ SOLN
INTRAMUSCULAR | Status: DC | PRN
  Administered 2014-04-28: 50 ug via INTRAVENOUS
  Administered 2014-04-28: 100 ug via INTRAVENOUS

## 2014-04-28 MED ORDER — NEOSTIGMINE METHYLSULFATE 10 MG/10ML IV SOLN
INTRAVENOUS | Status: DC | PRN
  Administered 2014-04-28: 4 mg via INTRAVENOUS

## 2014-04-28 MED ORDER — DOCUSATE SODIUM 100 MG PO CAPS
100.0000 mg | ORAL_CAPSULE | Freq: Two times a day (BID) | ORAL | Status: DC
  Administered 2014-04-28 – 2014-04-30 (×4): 100 mg via ORAL
  Filled 2014-04-28 (×4): qty 1

## 2014-04-28 MED ORDER — PHENYLEPHRINE HCL 10 MG/ML IJ SOLN
INTRAMUSCULAR | Status: DC | PRN
  Administered 2014-04-28: 80 ug via INTRAVENOUS
  Administered 2014-04-28: 120 ug via INTRAVENOUS
  Administered 2014-04-28: 80 ug via INTRAVENOUS

## 2014-04-28 MED ORDER — ONDANSETRON HCL 4 MG/2ML IJ SOLN
INTRAMUSCULAR | Status: DC | PRN
  Administered 2014-04-28 (×2): 4 mg via INTRAVENOUS

## 2014-04-28 MED ORDER — OXYCODONE-ACETAMINOPHEN 5-325 MG PO TABS
1.0000 | ORAL_TABLET | ORAL | Status: DC | PRN
  Administered 2014-04-29 – 2014-04-30 (×4): 2 via ORAL
  Filled 2014-04-28 (×4): qty 2

## 2014-04-28 MED ORDER — ONE-DAILY MULTI VITAMINS PO TABS: 1.0000 | ORAL_TABLET | Freq: Every day | ORAL | Status: DC

## 2014-04-28 MED ORDER — BUPIVACAINE LIPOSOME 1.3 % IJ SUSP
INTRAMUSCULAR | Status: DC | PRN
  Administered 2014-04-28: 20 mL

## 2014-04-28 MED ORDER — SUMATRIPTAN SUCCINATE 50 MG PO TABS
50.0000 mg | ORAL_TABLET | ORAL | Status: AC | PRN
  Administered 2014-04-28 – 2014-04-29 (×2): 50 mg via ORAL
  Filled 2014-04-28 (×3): qty 1

## 2014-04-28 MED ORDER — ALBUTEROL SULFATE HFA 108 (90 BASE) MCG/ACT IN AERS: 2.0000 | INHALATION_SPRAY | RESPIRATORY_TRACT | Status: DC | PRN

## 2014-04-28 MED ORDER — ALUM & MAG HYDROXIDE-SIMETH 200-200-20 MG/5ML PO SUSP: 30.0000 mL | Freq: Four times a day (QID) | ORAL | Status: DC | PRN

## 2014-04-28 MED ORDER — LACTATED RINGERS IV SOLN
INTRAVENOUS | Status: DC
  Administered 2014-04-28: 12:00:00 via INTRAVENOUS

## 2014-04-28 MED ORDER — ATORVASTATIN CALCIUM 40 MG PO TABS
40.0000 mg | ORAL_TABLET | Freq: Every day | ORAL | Status: DC
  Administered 2014-04-28 – 2014-04-29 (×2): 40 mg via ORAL
  Filled 2014-04-28 (×3): qty 1

## 2014-04-28 MED ORDER — OXYCODONE HCL 5 MG PO CAPS: 5.0000 mg | ORAL_CAPSULE | ORAL | Status: DC | PRN

## 2014-04-28 MED ORDER — BUPIVACAINE LIPOSOME 1.3 % IJ SUSP
20.0000 mL | INTRAMUSCULAR | Status: AC
  Filled 2014-04-28: qty 20

## 2014-04-28 MED ORDER — SODIUM CHLORIDE 0.9 % IR SOLN
Status: DC | PRN
  Administered 2014-04-28: 15:00:00

## 2014-04-28 MED ORDER — DIAZEPAM 5 MG PO TABS
5.0000 mg | ORAL_TABLET | Freq: Four times a day (QID) | ORAL | Status: DC | PRN
  Administered 2014-04-28 – 2014-04-30 (×6): 5 mg via ORAL
  Filled 2014-04-28 (×6): qty 1

## 2014-04-28 MED ORDER — HEMOSTATIC AGENTS (NO CHARGE) OPTIME
TOPICAL | Status: DC | PRN
  Administered 2014-04-28: 1 via TOPICAL

## 2014-04-28 MED ORDER — LORATADINE 10 MG PO TABS
10.0000 mg | ORAL_TABLET | Freq: Every day | ORAL | Status: DC
  Administered 2014-04-28 – 2014-04-30 (×3): 10 mg via ORAL
  Filled 2014-04-28 (×3): qty 1

## 2014-04-28 MED ORDER — DEXAMETHASONE SODIUM PHOSPHATE 4 MG/ML IJ SOLN
INTRAMUSCULAR | Status: DC | PRN
  Administered 2014-04-28: 8 mg via INTRAVENOUS

## 2014-04-28 MED ORDER — MORPHINE SULFATE 2 MG/ML IJ SOLN
1.0000 mg | INTRAMUSCULAR | Status: DC | PRN
  Administered 2014-04-28 – 2014-04-29 (×4): 2 mg via INTRAVENOUS
  Filled 2014-04-28 (×4): qty 1

## 2014-04-28 MED ORDER — ROCURONIUM BROMIDE 100 MG/10ML IV SOLN
INTRAVENOUS | Status: DC | PRN
  Administered 2014-04-28: 50 mg via INTRAVENOUS

## 2014-04-28 MED ORDER — ACETAMINOPHEN 650 MG RE SUPP: 650.0000 mg | RECTAL | Status: DC | PRN

## 2014-04-28 MED ORDER — LIDOCAINE HCL (CARDIAC) 20 MG/ML IV SOLN
INTRAVENOUS | Status: DC | PRN
  Administered 2014-04-28: 60 mg via INTRAVENOUS

## 2014-04-28 MED ORDER — PHENOL 1.4 % MT LIQD: 1.0000 | OROMUCOSAL | Status: DC | PRN

## 2014-04-28 SURGICAL SUPPLY — 63 items
BAG DECANTER FOR FLEXI CONT (MISCELLANEOUS) ×3 IMPLANT
BENZOIN TINCTURE PRP APPL 2/3 (GAUZE/BANDAGES/DRESSINGS) ×3 IMPLANT
BLADE 10 SAFETY STRL DISP (BLADE) ×3 IMPLANT
BLADE SURG ROTATE 9660 (MISCELLANEOUS) IMPLANT
BRUSH SCRUB EZ PLAIN DRY (MISCELLANEOUS) ×3 IMPLANT
BUR ACORN 6.0 (BURR) ×2 IMPLANT
BUR ACORN 6.0MM (BURR) ×1
BUR MATCHSTICK NEURO 3.0 LAGG (BURR) ×3 IMPLANT
CANISTER SUCT 3000ML (MISCELLANEOUS) ×3 IMPLANT
CLOSURE WOUND 1/2 X4 (GAUZE/BANDAGES/DRESSINGS) ×1
CONT SPEC 4OZ CLIKSEAL STRL BL (MISCELLANEOUS) ×6 IMPLANT
DRAPE LAPAROTOMY 100X72X124 (DRAPES) ×3 IMPLANT
DRAPE MICROSCOPE LEICA (MISCELLANEOUS) ×3 IMPLANT
DRAPE POUCH INSTRU U-SHP 10X18 (DRAPES) ×3 IMPLANT
DRAPE SURG 17X23 STRL (DRAPES) ×12 IMPLANT
ELECT BLADE 4.0 EZ CLEAN MEGAD (MISCELLANEOUS) ×3
ELECT REM PT RETURN 9FT ADLT (ELECTROSURGICAL) ×3
ELECTRODE BLDE 4.0 EZ CLN MEGD (MISCELLANEOUS) ×1 IMPLANT
ELECTRODE REM PT RTRN 9FT ADLT (ELECTROSURGICAL) ×1 IMPLANT
EVACUATOR 1/8 PVC DRAIN (DRAIN) ×3 IMPLANT
GAUZE SPONGE 4X4 16PLY XRAY LF (GAUZE/BANDAGES/DRESSINGS) ×3 IMPLANT
GLOVE BIO SURGEON STRL SZ8 (GLOVE) ×3 IMPLANT
GLOVE BIO SURGEON STRL SZ8.5 (GLOVE) ×3 IMPLANT
GLOVE BIOGEL PI IND STRL 8 (GLOVE) ×1 IMPLANT
GLOVE BIOGEL PI IND STRL 8.5 (GLOVE) ×1 IMPLANT
GLOVE BIOGEL PI INDICATOR 8 (GLOVE) ×2
GLOVE BIOGEL PI INDICATOR 8.5 (GLOVE) ×2
GLOVE ECLIPSE 7.5 STRL STRAW (GLOVE) ×6 IMPLANT
GLOVE EXAM NITRILE LRG STRL (GLOVE) IMPLANT
GLOVE EXAM NITRILE MD LF STRL (GLOVE) IMPLANT
GLOVE EXAM NITRILE XL STR (GLOVE) IMPLANT
GLOVE EXAM NITRILE XS STR PU (GLOVE) IMPLANT
GLOVE INDICATOR 7.5 STRL GRN (GLOVE) ×3 IMPLANT
GLOVE OPTIFIT SS 8.5 STRL (GLOVE) ×3 IMPLANT
GLOVE SS BIOGEL STRL SZ 8 (GLOVE) ×1 IMPLANT
GLOVE SUPERSENSE BIOGEL SZ 8 (GLOVE) ×2
GLOVE SURG SS PI 7.0 STRL IVOR (GLOVE) ×6 IMPLANT
GOWN STRL REUS W/ TWL LRG LVL3 (GOWN DISPOSABLE) ×1 IMPLANT
GOWN STRL REUS W/ TWL XL LVL3 (GOWN DISPOSABLE) ×2 IMPLANT
GOWN STRL REUS W/TWL 2XL LVL3 (GOWN DISPOSABLE) ×3 IMPLANT
GOWN STRL REUS W/TWL LRG LVL3 (GOWN DISPOSABLE) ×2
GOWN STRL REUS W/TWL XL LVL3 (GOWN DISPOSABLE) ×4
KIT BASIN OR (CUSTOM PROCEDURE TRAY) ×3 IMPLANT
KIT ROOM TURNOVER OR (KITS) ×3 IMPLANT
NEEDLE HYPO 21X1.5 SAFETY (NEEDLE) ×3 IMPLANT
NEEDLE HYPO 22GX1.5 SAFETY (NEEDLE) ×3 IMPLANT
NS IRRIG 1000ML POUR BTL (IV SOLUTION) ×3 IMPLANT
PACK LAMINECTOMY NEURO (CUSTOM PROCEDURE TRAY) ×3 IMPLANT
PAD ARMBOARD 7.5X6 YLW CONV (MISCELLANEOUS) ×9 IMPLANT
PATTIES SURGICAL .5 X1 (DISPOSABLE) IMPLANT
RUBBERBAND STERILE (MISCELLANEOUS) ×6 IMPLANT
SPONGE GAUZE 4X4 12PLY (GAUZE/BANDAGES/DRESSINGS) ×3 IMPLANT
SPONGE SURGIFOAM ABS GEL SZ50 (HEMOSTASIS) ×6 IMPLANT
STRIP CLOSURE SKIN 1/2X4 (GAUZE/BANDAGES/DRESSINGS) ×2 IMPLANT
SUT VIC AB 1 CT1 18XBRD ANBCTR (SUTURE) ×2 IMPLANT
SUT VIC AB 1 CT1 8-18 (SUTURE) ×4
SUT VIC AB 2-0 CP2 18 (SUTURE) ×6 IMPLANT
SYR 20CC LL (SYRINGE) ×3 IMPLANT
SYR 20ML ECCENTRIC (SYRINGE) ×3 IMPLANT
TAPE CLOTH SURG 4X10 WHT LF (GAUZE/BANDAGES/DRESSINGS) ×3 IMPLANT
TOWEL OR 17X24 6PK STRL BLUE (TOWEL DISPOSABLE) ×3 IMPLANT
TOWEL OR 17X26 10 PK STRL BLUE (TOWEL DISPOSABLE) ×3 IMPLANT
WATER STERILE IRR 1000ML POUR (IV SOLUTION) ×3 IMPLANT

## 2014-04-28 NOTE — Op Note (Signed)
Brief history: The patient is a 68 year old white female who has complained of back, buttock, and leg pain consistent with neurogenic claudication. She has failed medical management and was worked up with a lumbar MRI. This oval cyst at L3-4 and spinal stenosis and a mild spinal listhesis at L4-5. I discussed the various treatment options with the patient including surgery. She has weighed the risks, benefits, and alternatives surgery and decided proceed with an L34 and L4-5 laminectomy and removal of the synovial cyst.  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 Delma OfficerJeff Rosamund Nyland  Asst.: Dr. Maeola HarmanJoseph Stern  Anesthesia: Gen. endotracheal  Estimated blood loss: 125 cc  Drains: One medium Hemovac drain in the epidural space  Complications: None  Description of procedure: The patient was brought to the operating room by the anesthesia team. General endotracheal anesthesia was induced. The patient was turned to the prone position on the Wilson frame. The patient's lumbosacral region was then prepared with Betadine scrub and Betadine solution. Sterile drapes were applied.  I then injected the area to be incised with Marcaine with epinephrine solution. I then used a scalpel to make a linear midline incision over the L3-4 and L4-5 intervertebral disc space. I then used electrocautery to perform a lateral  subperiosteal dissection exposing the spinous process and lamina of L3, L4 and L5. We obtained intraoperative radiograph to confirm our location. I then inserted the [facility] retractor for exposure. I incised interspinous ligament at L2-3, L3-4 and L4-5. I used Leksell nodule were to remove the  spinous process of L3 and L4.  We then brought the operative microscope into the field. Under its magnification and illumination we completed the microdissection. I used a high-speed drill to perform a laminotomy at L3 and L4 bilaterally. I then used a Kerrison punches to complete the L3 and L4 laminectomy and removed the ligamentum flavum at L2-3, L3-4 and L4-5. We encountered what appeared to be a hemorrhagic synovial cyst at L3-4. We freed it up from the thecal sac and remove it with a Kerrison punches. We sent specimens to the pathologist. We then used microdissection to free up the thecal sac and the bilateral L3, L4 and L5 nerve root from the epidural tissue. I then used a Kerrison punch to perform a foraminotomy at about the bilateral L3, L4 and L5 nerve root. We inspected the intervertebral disc at L3-4 and L4-5. There were no significant herniations.  I then palpated along the ventral surface of the thecal sac and along exit route of the bilateral L3, L4 and L5 nerve root and noted that the neural structures were well decompressed. This completed the decompression.  We then obtained hemostasis using bipolar electrocautery. We irrigated the wound out with bacitracin solution. We then removed the retractor. I placed a medium Hemovac drain in the epidural space and tunneled it out through separate stab wound. We then reapproximated the patient's thoracolumbar fascia with interrupted #1 Vicryl suture. We then reapproximated the patient's subcutaneous tissue with interrupted 2-0 Vicryl suture. We then reapproximated patient's skin with Steri-Strips and benzoin. The was then coated with bacitracin ointment. The drapes were removed. The patient was subsequently returned to the supine position where they were extubated by the anesthesia team. The patient was then  transported to the postanesthesia care unit in stable condition. All sponge instrument and needle counts were reportedly correct at the end of this  case.

## 2014-04-28 NOTE — H&P (Signed)
Subjective: The patient is a 68 year old white female who has been having progressive back pain with pain going down her leg with some left leg weakness. She has failed medical management and was worked up with a lumbar MRI. This demonstrated an L3-4 stenosis and mass. 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 I discussed the various treatment with the patient and her husband including surgery. They have weighed the risks, benefits, and alternatives surgery and decided proceed with at L3-4 and L4-5 laminectomy for removal of the cyst.   Past Medical History  Diagnosis Date  . PONV (postoperative nausea and vomiting)   . Hypertension   . Chronic kidney disease     hx. UTI  . Headache(784.0)   . Arthritis   . Depression   . Hyperlipidemia     Past Surgical History  Procedure Laterality Date  . Abdominal hysterectomy    . Joint replacement      bilateral knee replacements  . Shoulder arthroscopy      right shoulder  . Arthroplasty  07/07/2012    LEFT SHOULDER  . Reverse shoulder arthroplasty  07/07/2012    Procedure: REVERSE SHOULDER ARTHROPLASTY;  Surgeon: Mable ParisJustin William Chandler, MD;  [facility];  Service: Orthopedics;  Laterality: Left;    No Known Allergies  History  Substance Use Topics  . Smoking status: Former Smoker -- 0.50 packs/day for 8 years    Types: Cigarettes    Quit date: 01/08/1986  . Smokeless tobacco: Never Used  . Alcohol Use: No    History reviewed. No pertinent family history. Prior to Admission medications   Medication Sig Start Date End Date Taking? Authorizing Provider  aspirin EC 81 MG tablet Take 81 mg by mouth daily.   Yes Historical Provider, MD  atorvastatin (LIPITOR) 40 MG tablet Take 40 mg by mouth daily.   Yes Historical Provider, MD  cetirizine (ZYRTEC) 10 MG tablet Take 10 mg by mouth daily.   Yes Historical Provider, MD  chlorhexidine (PERIDEX) 0.12 % solution Use as directed 15 mLs in  the mouth or throat 2 (two) times daily. Rinse and spit.   Yes Historical Provider, MD  Multiple Vitamin (MULTIVITAMIN) tablet Take 1 tablet by mouth daily.   Yes Historical Provider, MD  Omega-3 Fatty Acids (FISH OIL) 300 MG CAPS Take 300 mg by mouth daily.   Yes Historical Provider, MD  oxycodone (OXY-IR) 5 MG capsule Take 5-10 mg by mouth every 4 (four) hours as needed.   Yes Historical Provider, MD  SUMAtriptan (IMITREX) 50 MG tablet Take 50 mg by mouth every 2 (two) hours as needed. For migraine.   Yes Historical Provider, MD  tretinoin (RETIN-A) 0.025 % cream Apply 1 application topically at bedtime.   Yes Historical Provider, MD  triamterene-hydrochlorothiazide (MAXZIDE-25) 37.5-25 MG per tablet Take 1 tablet by mouth daily.   Yes Historical Provider, MD  albuterol (PROVENTIL HFA;VENTOLIN HFA) 108 (90 BASE) MCG/ACT inhaler Inhale 2 puffs into the lungs every 4 (four) hours as needed. For wheezing.    Historical Provider, MD  albuterol (PROVENTIL) (2.5 MG/3ML) 0.083% nebulizer solution Take 2.5 mg by nebulization every 6 (six) hours as needed. For shortness of breath or wheezing.    Historical Provider, MD     Review of Systems  Positive ROS: As above  All other systems have been reviewed and were otherwise negative with the exception of those mentioned in the HPI and as above.  Objective:  Vital signs in last 24 hours: Temp:  [98.5 F (36.9 C)] 98.5 F (36.9 C) (05/21 1134) Pulse Rate:  [90] 90 (05/21 1134) Resp:  [18] 18 (05/21 1134) BP: (138)/(73) 138/73 mmHg (05/21 1134) SpO2:  [99 %] 99 % (05/21 1134) Weight:  [83.008 kg (183 lb)] 83.008 kg (183 lb) (05/21 1134)  General Appearance: Alert, cooperative, no distress, Head: Normocephalic, without obvious abnormality, atraumatic Eyes: PERRL, conjunctiva/corneas clear, EOM's intact,    Ears: Normal  Throat: Normal  Neck: Supple, symmetrical, trachea midline, no adenopathy; thyroid: No enlargement/tenderness/nodules; no carotid  bruit or JVD Back: Symmetric, no curvature, ROM normal, no CVA tenderness Lungs: Clear to auscultation bilaterally, respirations unlabored Heart: Regular rate and rhythm, no murmur, rub or gallop Abdomen: Soft, non-tender,, no masses, no organomegaly Extremities: Extremities normal, atraumatic, no cyanosis or edema Pulses: 2+ and symmetric all extremities Skin: Skin color, texture, turgor normal, no rashes or lesions  NEUROLOGIC:   Mental status: alert and oriented, no aphasia, good attention span, Fund of knowledge/ memory ok Motor Exam - grossly normal Sensory Exam - grossly normal Reflexes:  Coordination - grossly normal Gait - grossly normal Balance - grossly normal Cranial Nerves: I: smell Not tested  II: visual acuity  OS: Normal  OD: Normal   II: visual fields Full to confrontation  II: pupils Equal, round, reactive to light  III,VII: ptosis None  III,IV,VI: extraocular muscles  Full ROM  V: mastication Normal  V: facial light touch sensation  Normal  V,VII: corneal reflex  Present  VII: facial muscle function - upper  Normal  VII: facial muscle function - lower Normal  VIII: hearing Not tested  IX: soft palate elevation  Normal  IX,X: gag reflex Present  XI: trapezius strength  5/5  XI: sternocleidomastoid strength 5/5  XI: neck flexion strength  5/5  XII: tongue strength  Normal    Data Review Lab Results  Component Value Date   WBC 9.6 04/22/2014   HGB 13.9 04/22/2014   HCT 42.9 04/22/2014   MCV 89.6 04/22/2014   PLT 322 04/22/2014   Lab Results  Component Value Date   NA 141 04/22/2014   K 5.1 04/22/2014   CL 101 04/22/2014   CO2 28 04/22/2014   BUN 15 04/22/2014   CREATININE 0.83 04/22/2014   GLUCOSE 92 04/22/2014   Lab Results  Component Value Date   INR 0.89 06/30/2012    Assessment/Plan: L3-4 and L4-5 spinal stenosis, stenosis, lumbago, lumbar radiculopathy, neurogenic claudication: I discussed situation with the patient and her husband. I reviewed  her MR scan with her and pointed out the abnormalities. We have discussed the various treatment options including surgery. I described the surgical treatment option of a lumbar laminectomy with removal of the cyst. I've shown her surgical models. We have discussed the risks, benefits, alternatives, and likelihood of achieving our goals with surgery. I've answered all the patient's questions. She has decided to proceed with surgery.   Cristi LoronJeffrey D Parrie Rasco 04/28/2014 2:18 PM

## 2014-04-28 NOTE — Anesthesia Preprocedure Evaluation (Addendum)
Anesthesia Evaluation  Patient identified by MRN, date of birth, ID band  History of Anesthesia Complications (+) PONV and history of anesthetic complications  Airway Mallampati: I TM Distance: >3 FB Neck ROM: Full    Dental  (+) Teeth Intact, Implants, Dental Advisory Given   Pulmonary former smoker,  breath sounds clear to auscultation        Cardiovascular hypertension, Pt. on medications Rhythm:Regular Rate:Normal     Neuro/Psych  Headaches, PSYCHIATRIC DISORDERS Depression    GI/Hepatic   Endo/Other    Renal/GU Renal disease     Musculoskeletal   Abdominal   Peds  Hematology   Anesthesia Other Findings   Reproductive/Obstetrics                         Anesthesia Physical Anesthesia Plan  ASA: III  Anesthesia Plan: General   Post-op Pain Management:    Induction: Intravenous  Airway Management Planned: Oral ETT  Additional Equipment:   Intra-op Plan:   Post-operative Plan: Extubation in OR  Informed Consent: I have reviewed the patients History and Physical, chart, labs and discussed the procedure including the risks, benefits and alternatives for the proposed anesthesia with the patient or authorized representative who has indicated his/her understanding and acceptance.   Dental advisory given  Plan Discussed with: Anesthesiologist and CRNA  Anesthesia Plan Comments:        Anesthesia Quick Evaluation

## 2014-04-28 NOTE — Transfer of Care (Signed)
Immediate Anesthesia Transfer of Care Note  Patient: Paige Higgins  Procedure(s) Performed: Procedure(s) with comments: LUMBAR THREE TO FOUR, LUMBAR FOUR TO FIVE  LAMINECTOMY/DECOMPRESSION MICRODISCECTOMY 2 LEVELS (N/A) - L34 L45 laminectomies  Patient Location: PACU  Anesthesia Type:General  Level of Consciousness: awake, alert  and oriented  Airway & Oxygen Therapy: Patient Spontanous Breathing and Patient connected to nasal cannula oxygen  Post-op Assessment: Report given to PACU RN, Post -op Vital signs reviewed and stable and Patient moving all extremities  Post vital signs: Reviewed and stable  Complications: No apparent anesthesia complications

## 2014-04-28 NOTE — Anesthesia Procedure Notes (Signed)
Procedure Name: Intubation Date/Time: 04/28/2014 2:33 PM Performed by: Orvilla FusATO, Lesa Vandall A Pre-anesthesia Checklist: Timeout performed, Patient identified, Emergency Drugs available, Patient being monitored and Suction available Patient Re-evaluated:Patient Re-evaluated prior to inductionOxygen Delivery Method: Circle system utilized Preoxygenation: Pre-oxygenation with 100% oxygen Intubation Type: IV induction Ventilation: Mask ventilation without difficulty Laryngoscope Size: Mac and 3 Grade View: Grade I Tube type: Oral Tube size: 7.0 mm Number of attempts: 1 Airway Equipment and Method: Stylet Placement Confirmation: ETT inserted through vocal cords under direct vision,  breath sounds checked- equal and bilateral and positive ETCO2 Secured at: 21 cm Tube secured with: Tape Dental Injury: Teeth and Oropharynx as per pre-operative assessment

## 2014-04-28 NOTE — Progress Notes (Signed)
Subjective:  The patient is somnolent but easily arousable. She is in no apparent distress.  Objective: Vital signs in last 24 hours: Temp:  [97.6 F (36.4 C)-98.5 F (36.9 C)] 97.6 F (36.4 C) (05/21 1637) Pulse Rate:  [90] 90 (05/21 1134) Resp:  [18] 18 (05/21 1134) BP: (138)/(73) 138/73 mmHg (05/21 1134) SpO2:  [99 %] 99 % (05/21 1134) Weight:  [83.008 kg (183 lb)] 83.008 kg (183 lb) (05/21 1134)  Intake/Output from previous day:   Intake/Output this shift: Total I/O In: 1150 [I.V.:1150] Out: 50 [Blood:50]  Physical exam the patient is somnolent but arousable. She is moving her lower extremities well.  Lab Results: No results found for this basename: WBC, HGB, HCT, PLT,  in the last 72 hours BMET No results found for this basename: NA, K, CL, CO2, GLUCOSE, BUN, CREATININE, CALCIUM,  in the last 72 hours  Studies/Results: No results found.  Assessment/Plan: The patient is doing well.  LOS: 0 days     Cristi LoronJeffrey D Averyanna Sax 04/28/2014, 4:43 PM

## 2014-04-29 ENCOUNTER — Encounter (HOSPITAL_COMMUNITY): Payer: Self-pay | Admitting: Neurosurgery

## 2014-04-29 MED ORDER — FLUTICASONE PROPIONATE 50 MCG/ACT NA SUSP
2.0000 | Freq: Every day | NASAL | Status: DC
  Administered 2014-04-29 – 2014-04-30 (×2): 2 via NASAL
  Filled 2014-04-29: qty 16

## 2014-04-29 NOTE — Progress Notes (Addendum)
Requested flonase from physician, send message to receptionist at [facility]. Will continue to monitor.

## 2014-04-29 NOTE — Anesthesia Postprocedure Evaluation (Signed)
  Anesthesia Post-op Note  Patient: Paige Higgins  Procedure(s) Performed: Procedure(s) with comments: LUMBAR THREE TO FOUR, LUMBAR FOUR TO FIVE  LAMINECTOMY/DECOMPRESSION MICRODISCECTOMY 2 LEVELS (N/A) - L34 L45 laminectomies  Patient Location: PACU  Anesthesia Type:General  Level of Consciousness: awake  Airway and Oxygen Therapy: Patient Spontanous Breathing  Post-op Pain: mild  Post-op Assessment: Post-op Vital signs reviewed  Post-op Vital Signs: Reviewed  Last Vitals:  Filed Vitals:   04/29/14 0631  BP: 127/77  Pulse: 92  Temp: 36.7 C  Resp: 18    Complications: No apparent anesthesia complications

## 2014-04-29 NOTE — Evaluation (Signed)
Physical Therapy Evaluation Patient Details Name: Paige PentonLinda B Higgins MRN: [id number] DOB: 1946-06-27 Today's Date: 04/29/2014   History of Present Illness  68 y.o. [facility] for elective L3/4, L4/5 laminectomy and decompression and removal of synovial cyst.  PT has significant PMHx of HTN, L reverse shoulder arthroplasty, CKD.    Clinical Impression  Pt is POD #1 s/p lumbar spine surgery.  She is moving well and I anticipate she will do well enough to go home with no PT f/u.  Back precautions given and reviewed for comfort.  PT to follow acutely.     Follow Up Recommendations No PT follow up;Supervision - Intermittent    Equipment Recommendations  None recommended by PT    Recommendations for Other Services   NA    Precautions / Restrictions Precautions Precautions: Back Precaution Booklet Issued: Yes (comment) Precaution Comments: handout given and reviewed back precautions for comfort as well as lifting restriction, need to walk TID, and no sitting for >30-45 mins at a time      Mobility  Bed Mobility Overal bed mobility: Needs Assistance Bed Mobility: Rolling;Sidelying to Sit Rolling: Min guard Sidelying to sit: Min guard       General bed mobility comments: verbal cues for log roll technique, min guard assist for safety and to help with transitions as HOB was flat and I took away the railing.   Transfers Overall transfer level: Needs assistance Equipment used: Rolling walker (2 wheeled) Transfers: Sit to/from Stand Sit to Stand: Min guard         General transfer comment: Min guard assist to support trunk for transitions. Verbal cues for safe hand placement.   Ambulation/Gait Ambulation/Gait assistance: Min guard Ambulation Distance (Feet): 75 Feet Assistive device: Rolling walker (2 wheeled) Gait Pattern/deviations: Step-through pattern;Shuffle;Trunk flexed Gait velocity: decreased Gait velocity interpretation: Below normal speed for  age/gender General Gait Details: Verbal cues for upright posture, safe RW use, pt reported generally feeling weak on her feet (muscle weakness, not lightheadedness), no buckling or foot drag noted.          Balance Overall balance assessment: Needs assistance Sitting-balance support: Feet supported Sitting balance-Leahy Scale: Good     Standing balance support: Bilateral upper extremity supported Standing balance-Leahy Scale: Poor Standing balance comment: pt feels she needs the support of the RW in standing.                              Pertinent Vitals/Pain See vitals flow sheet.     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, drives        Extremity/Trunk Assessment   [facility] Extremity Assessment: [facility] tasks assessed (left arm with h/o shoulder surgery)           Lower Extremity Assessment: Generalized weakness (general post-op weakness throughout, no asymmetry)      Cervical / Trunk Assessment: Normal  Communication   Communication:  No difficulties  Cognition Arousal/Alertness: Awake/alert Behavior During Therapy: WFL for tasks assessed/performed Overall Cognitive Status: Within Functional Limits for tasks assessed                               Assessment/Plan    PT Assessment Patient needs continued PT services  PT Diagnosis Difficulty walking;Abnormality of gait;Generalized  weakness;Acute pain   PT Problem List Decreased strength;Decreased activity tolerance;Decreased balance;Decreased mobility;Decreased knowledge of use of DME;Decreased knowledge of precautions;Pain  PT Treatment Interventions DME instruction;Gait training;Functional mobility training;Stair training;Therapeutic activities;Therapeutic exercise;Balance training;Neuromuscular re-education;Patient/family education;Modalities   PT Goals (Current goals can be found in the Care Plan section) Acute Rehab PT Goals Patient Stated Goal: to get stronger so she doesn't have to use a RW PT Goal Formulation: With patient Time For Goal Achievement: 05/06/14 Potential to Achieve Goals: Good    Frequency Min 5X/week   Barriers to discharge Decreased caregiver support husband can only provide supervision as he is finishing CA tx.        End of Session Equipment Utilized During Treatment: Gait belt Activity Tolerance: Patient limited by fatigue Patient left: in chair;with call bell/phone within reach           Time: 5681-2751 PT Time Calculation (min): 39 min   Charges:   PT Evaluation $Initial PT Evaluation Tier I: 1 Procedure PT Treatments $Gait Training: 8-22 mins $Self Care/Home Management: 8-22        Fotini Lemus B. Amyiah Gaba, PT, DPT [phone]   04/29/2014, 2:12 PM

## 2014-04-29 NOTE — Progress Notes (Signed)
INITIAL NUTRITION ASSESSMENT  DOCUMENTATION CODES Per approved criteria  -Obesity Unspecified   INTERVENTION: Provide Dispensing optician Cup once daily Provide Multivitamin with minerals daily Encourage PO intake   NUTRITION DIAGNOSIS: Inadequate oral intake related to poor appetite/depression as evidenced by reported PO intake <50% of meals for 2-3 months with 7% weight loss.   Goal: Pt to meet >/= 90% of their estimated nutrition needs   Monitor:  PO intake, bowel function, weight trend, labs  Reason for Assessment: Malnutrition Screening Tool, score of 2  68 y.o. female  Admitting Dx: <principal problem not specified>  ASSESSMENT: 68 year old white female who has been having progressive back pain with pain going down her leg with some left leg weakness. She has failed medical management and was worked up with a lumbar MRI. This demonstrated an L3-4 stenosis and mass. 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  S/P L3 and L4 laminectomy with resection of synovial cyst at L3-4 and decompression of the bilateral L3, L4 and L5 nerve roots using micro-dissection.  Pt reports that she has had a poor appetite for the past 2-3 months due to stress/depression regarding her medical condition. Pt states that she ate a few bites at breakfast this morning, about the same amount she has been eating at each meal for the past 2-3 months. She states that she usually weighs 191 lbs and just PTA she weighed herself at home at 178 lbs - 7% weight loss in less than 3 months. Pt states that she is happy about her weight loss as she can afford to lose weight. Encouraged pt to make sure she is getting adequate nutrition during weight loss. Encouraged pt to eat 3 balanced meals daily with fruits., veggies, and protein-rich foods. Encouraged intake of daily multivitamin. Encouraged intake of Carnation Instant Breakfast with her milk if pt  skips a meal and/or continues to eat poorly.   Height: Ht Readings from Last 1 Encounters:  04/22/14 5\' 4"  (1.626 m)    Weight: Wt Readings from Last 1 Encounters:  04/28/14 183 lb (83.008 kg)    Ideal Body Weight: 120 lbs  % Ideal Body Weight: 153%  Wt Readings from Last 10 Encounters:  04/28/14 183 lb (83.008 kg)  04/28/14 183 lb (83.008 kg)  04/22/14 183 lb (83.008 kg)  06/30/12 185 lb 6.5 oz (84.1 kg)    Usual Body Weight: 191 lbs  % Usual Body Weight: 96%  BMI:  Body mass index is 31.4 kg/(m^2). (Obese)  Estimated Nutritional Needs: Kcal: 1700-1850 Protein: 80-90 grams Fluid: 1.7-1.9 L/day  Skin: closed back incision with closed system drain  Diet Order: General  EDUCATION NEEDS: -No education needs identified at this time   Intake/Output Summary (Last 24 hours) at 04/29/14 1234 Last data filed at 04/28/14 2203  Gross per 24 hour  Intake   1300 ml  Output    120 ml  Net   1180 ml    Last BM: PTA  Labs:  No results found for this basename: NA, K, CL, CO2, BUN, CREATININE, CALCIUM, MG, PHOS, GLUCOSE,  in the last 168 hours  CBG (last 3)  No results found for this basename: GLUCAP,  in the last 72 hours  Scheduled Meds: . atorvastatin  40 mg Oral q1800  . bupivacaine liposome  20 mL Infiltration To NeurOR  . docusate sodium  100 mg Oral BID  . fluticasone  2 spray Each Nare  Daily  . loratadine  10 mg Oral Daily  . multivitamin with minerals  1 tablet Oral Daily  . triamterene-hydrochlorothiazide  1 tablet Oral Daily    Continuous Infusions: . lactated ringers 50 mL/hr at 04/28/14 1157  . lactated ringers 75 mL/hr at 04/28/14 1757    Past Medical History  Diagnosis Date  . PONV (postoperative nausea and vomiting)   . Hypertension   . Chronic kidney disease     hx. UTI  . Headache(784.0)   . Arthritis   . Depression   . Hyperlipidemia     Past Surgical History  Procedure Laterality Date  . Abdominal hysterectomy    . Joint  replacement      bilateral knee replacements  . Shoulder arthroscopy      right shoulder  . Arthroplasty  07/07/2012    LEFT SHOULDER  . Reverse shoulder arthroplasty  07/07/2012    Procedure: REVERSE SHOULDER ARTHROPLASTY;  Surgeon: Mable ParisJustin William Chandler, MD;  [facility];  Service: Orthopedics;  Laterality: Left;  . Lumbar laminectomy/decompression microdiscectomy N/A 04/28/2014    Procedure: LUMBAR THREE TO FOUR, LUMBAR FOUR TO FIVE  LAMINECTOMY/DECOMPRESSION MICRODISCECTOMY 2 LEVELS;  Surgeon: Cristi LoronJeffrey D Jenkins, MD;  Location: [facility];  Service: Neurosurgery;  Laterality: N/A;  L34 L45 laminectomies    Ian Malkineanne Barnett RD, LDN Inpatient Clinical Dietitian Pager: [phone] After Hours Pager: [phone]

## 2014-04-29 NOTE — Progress Notes (Signed)
CARE MANAGEMENT NOTE 04/29/2014  Patient:  Paige Higgins, Paige Higgins   Account Number:  [id number]  Date Initiated:  04/29/2014  Documentation initiated by:  Jiles Crocker  Subjective/Objective Assessment:   ADMITTED FOR BACK SURGERY     Action/Plan:   CM FOLLOWING FOR DCP   Anticipated DC Date:  05/02/2014   Anticipated DC Plan:  AWAITING FOR PT/OT EVALS FOR DISPOSITION NEEDS WITH ATTENDING MD APPROVAL     DC Planning Services  CM consult              Status of service:  In process, will continue to follow Medicare Important Message given?  NA - LOS <3 / Initial given by admissions (If response is "NO", the following Medicare IM given date fields will be blank)  Per UR Regulation:  Reviewed for med. necessity/level of care/duration of stay    Comments:  5/22/2015Abelino Derrick RN,BSN,MHA [phone]

## 2014-04-29 NOTE — Progress Notes (Signed)
Patient ID: Paige Higgins, female   DOB: 03/22/1946, 68 y.o.   MRN: [id number] Subjective:  The patient is alert and pleasant. She looks well.  Objective: Vital signs in last 24 hours: Temp:  [97 F (36.1 C)-98.5 F (36.9 C)] 98 F (36.7 C) (05/22 0631) Pulse Rate:  [83-97] 92 (05/22 0631) Resp:  [16-19] 18 (05/22 0631) BP: (115-146)/(71-88) 127/77 mmHg (05/22 0631) SpO2:  [94 %-100 %] 94 % (05/22 0631) Weight:  [83.008 kg (183 lb)] 83.008 kg (183 lb) (05/21 1134)  Intake/Output from previous day: 05/21 0701 - 05/22 0700 In: 1300 [P.O.:30; I.V.:1270] Out: 120 [Drains:70; Blood:50] Intake/Output this shift:    Physical exam patient is alert and oriented. She is moving her lower extremities well.  Lab Results: No results found for this basename: WBC, HGB, HCT, PLT,  in the last 72 hours BMET No results found for this basename: NA, K, CL, CO2, GLUCOSE, BUN, CREATININE, CALCIUM,  in the last 72 hours  Studies/Results: Dg Lumbar Spine 1 View  04/28/2014   CLINICAL DATA:  lumbar stenosis lumbar radiculopathy  EXAM: LUMBAR SPINE - 1 VIEW  COMPARISON:  MR LUMBAR SPINE WO/W CM dated 04/15/2014  FINDINGS: Skin retractors within the posterior soft tissues of the lower lumbar spine. Surgical probe tip along the inferior border of the L3-4 facet joint. Multilevel degenerative changes within the lumbar spine.  IMPRESSION: Intra op lumbar spine localization.   Electronically Signed   By: Salome Holmes M.D.   On: 04/28/2014 22:45    Assessment/Plan: Postop day 1: The patient is doing well. We will mobilize her. She will likely go home tomorrow.  LOS: 1 day     Cristi Loron 04/29/2014, 11:14 AM

## 2014-04-30 MED ORDER — DIAZEPAM 5 MG PO TABS: 5.0000 mg | ORAL_TABLET | Freq: Four times a day (QID) | ORAL | Status: DC | PRN

## 2014-04-30 MED ORDER — DSS 100 MG PO CAPS: 100.0000 mg | ORAL_CAPSULE | Freq: Two times a day (BID) | ORAL | Status: DC

## 2014-04-30 MED ORDER — OXYCODONE HCL 5 MG PO TABS: 5.0000 mg | ORAL_TABLET | ORAL | Status: DC | PRN

## 2014-04-30 NOTE — Progress Notes (Signed)
Pt d/c to home by car with family. Assessment stable. Prescriptions given. 

## 2014-04-30 NOTE — Progress Notes (Signed)
Physical Therapy Treatment Patient Details Name: Paige Higgins MRN: [id number] DOB: 07-Oct-1946 Today's Date: 05-20-14    History of Present Illness 68 y.o. female admitted to [facility] on 04/28/14 for elective L3/4, L4/5 laminectomy and decompression and removal of synovial cyst.  PT has significant PMHx of HTN, L reverse shoulder arthroplasty, CKD.      PT Comments    Pt making great progress with increased gait distance and stair education completed today.  Follow Up Recommendations  No PT follow up;Supervision - Intermittent     Equipment Recommendations  None recommended by PT    Precautions / Restrictions Precautions Precautions: Back Precaution Comments: Reinforced back precautions with mobility today Restrictions Weight Bearing Restrictions: No    Mobility  Bed Mobility Overal bed mobility: Needs Assistance Bed Mobility: Sit to Sidelying Rolling: Supervision Sidelying to sit: Min guard     Sit to sidelying: Min guard General bed mobility comments: cues for safety/sequenced with rolling/bed mobility to adhere to percautions. no physical assistance needed, increased time needed with bed flat and no rail used.  Transfers Overall transfer level: Modified independent Equipment used: Rolling walker (2 wheeled)   Sit to Stand: Modified independent (Device/Increase time)         General transfer comment: to/from bed,chair and elevated toilet. no cues or assistance needed.  Ambulation/Gait Ambulation/Gait assistance: Supervision Ambulation Distance (Feet): 200 Feet (100 feet x2) Assistive device: Rolling walker (2 wheeled) Gait Pattern/deviations: Step-through pattern;Decreased stride length;Shuffle Gait velocity: decreased Gait velocity interpretation: Below normal speed for age/gender General Gait Details: occasional cues for posture and to increased foot clearance with gait. no LOB noted with gait.   Stairs Stairs: Yes Stairs assistance: Min guard Stair  Management: No rails;Step to pattern;Forwards;With walker Number of Stairs: 1 General stair comments: demo' 1 step with walker to pt, pt able to return demo with min guard assit and cues on sequence with stairs.      Cognition Arousal/Alertness: Awake/alert Behavior During Therapy: WFL for tasks assessed/performed Overall Cognitive Status: Within Functional Limits for tasks assessed                  PT Goals (current goals can now be found in the care plan section) Acute Rehab PT Goals Patient Stated Goal: to get stronger so she doesn't have to use a RW PT Goal Formulation: With patient Time For Goal Achievement: 05/06/14 Potential to Achieve Goals: Good Progress towards PT goals: Progressing toward goals    Frequency  Min 5X/week    PT Plan Current plan remains appropriate    End of Session Equipment Utilized During Treatment: Gait belt Activity Tolerance: Patient tolerated treatment well Patient left: in chair;with call bell/phone within reach;with family/visitor present     Time: 0626-9485 PT Time Calculation (min): 28 min  Charges:  $Gait Training: 8-22 mins $Therapeutic Activity: 8-22 mins                    G Codes:      Sallyanne Kuster 05-20-2014, 1:01 PM  Sallyanne Kuster, PTA Office- [phone]

## 2014-04-30 NOTE — Discharge Summary (Signed)
Physician Discharge Summary  Patient ID: Paige Higgins MRN: [id number] DOB/AGE: [birth year] 68 y.o.  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 The surgery went well.  The patient's postoperative course was unremarkable. On postop day #2 she requested discharge to home. She was given oral and written discharge instructions. All her questions were answered.  Consults: None Significant Diagnostic Studies: None Treatments: L3 and L4 laminectomy Discharge Exam: Blood pressure 127/61, pulse 88, temperature 98.3 F (36.8 C), temperature source Oral, resp. rate 18, height 5\' 4"  (1.626 m), weight 83.462 kg (184 lb), SpO2 97.00%. Patient is alert and pleasant. Her strength is grossly normal in her lower extremities.  Disposition: Home  Discharge Instructions   Call MD for:  difficulty breathing, headache or visual disturbances    Complete by:  As directed      Call MD for:  extreme fatigue    Complete by:  As directed      Call MD for:  hives    Complete by:  As directed      Call MD for:  persistant dizziness or light-headedness    Complete by:  As directed      Call MD for:  persistant nausea and vomiting    Complete by:  As directed      Call MD for:  redness, tenderness, or signs of infection (pain, swelling, redness, odor or green/yellow discharge around incision site)    Complete by:  As directed      Call MD for:  severe uncontrolled pain    Complete by:  As directed      Call MD for:  temperature >100.4    Complete by:  As directed      Diet - low sodium heart  healthy    Complete by:  As directed      Discharge instructions    Complete by:  As directed   Call [phone] for a followup appointment. Take a stool softener while you are using pain medications.     Driving Restrictions    Complete by:  As directed   Do not drive for 2 weeks.     Increase activity slowly    Complete by:  As directed      Lifting restrictions    Complete by:  As directed   Do not lift more than 5 pounds. No excessive bending or twisting.     May shower / Bathe    Complete by:  As directed   He may shower after the pain she is removed 3 days after surgery. Leave the incision alone.     Remove dressing in 24 hours    Complete by:  As directed             Medication List         albuterol 108 (90 BASE) MCG/ACT inhaler  Commonly known as:  PROVENTIL HFA;VENTOLIN HFA  Inhale 2 puffs into the lungs every 4 (four) hours as needed. For wheezing.     albuterol (2.5 MG/3ML) 0.083% nebulizer solution  Commonly known as:  PROVENTIL  Take 2.5 mg by nebulization every 6 (six) hours as needed. For shortness  of breath or wheezing.     aspirin EC 81 MG tablet  Take 81 mg by mouth daily.     atorvastatin 40 MG tablet  Commonly known as:  LIPITOR  Take 40 mg by mouth daily.     cetirizine 10 MG tablet  Commonly known as:  ZYRTEC  Take 10 mg by mouth daily.     chlorhexidine 0.12 % solution  Commonly known as:  PERIDEX  Use as directed 15 mLs in the mouth or throat 2 (two) times daily. Rinse and spit.     diazepam 5 MG tablet  Commonly known as:  VALIUM  Take 1 tablet (5 mg total) by mouth every 6 (six) hours as needed for muscle spasms.     DSS 100 MG Caps  Take 100 mg by mouth 2 (two) times daily.     Fish Oil 300 MG Caps  Take 300 mg by mouth daily.     multivitamin tablet  Take 1 tablet by mouth daily.     oxycodone 5 MG capsule  Commonly known as:  OXY-IR  Take 5-10 mg by mouth every 4 (four) hours as needed.     oxyCODONE 5 MG immediate  release tablet  Commonly known as:  Oxy IR/ROXICODONE  Take 1-2 tablets (5-10 mg total) by mouth every 4 (four) hours as needed for severe pain.     SUMAtriptan 50 MG tablet  Commonly known as:  IMITREX  Take 50 mg by mouth every 2 (two) hours as needed. For migraine.     tretinoin 0.025 % cream  Commonly known as:  RETIN-A  Apply 1 application topically at bedtime.     triamterene-hydrochlorothiazide 37.5-25 MG per tablet  Commonly known as:  MAXZIDE-25  Take 1 tablet by mouth daily.         Signed: Cristi LoronJeffrey D Zayna Toste 04/30/2014, 1:40 PM

## 2014-05-06 ENCOUNTER — Ambulatory Visit: Payer: Self-pay | Admitting: Neurosurgery

## 2014-05-07 ENCOUNTER — Emergency Department: Payer: Self-pay | Admitting: Internal Medicine

## 2014-05-18 ENCOUNTER — Ambulatory Visit: Payer: Self-pay | Admitting: Family Medicine

## 2014-09-01 ENCOUNTER — Ambulatory Visit: Payer: Self-pay | Admitting: Pain Medicine

## 2015-01-18 ENCOUNTER — Ambulatory Visit: Payer: Self-pay | Admitting: Neurosurgery

## 2015-03-31 ENCOUNTER — Ambulatory Visit
Admit: 2015-03-31 | Disposition: A | Discharge status: Home / Self Care | Payer: Self-pay | Attending: Unknown Physician Specialty | Admitting: Unknown Physician Specialty

## 2015-04-03 LAB — SURGICAL PATHOLOGY

## 2015-04-12 ENCOUNTER — Other Ambulatory Visit: Payer: Self-pay | Admitting: Family Medicine

## 2015-04-12 DIAGNOSIS — Z1231 Encounter for screening mammogram for malignant neoplasm of breast: Secondary | ICD-10-CM

## 2015-05-22 ENCOUNTER — Ambulatory Visit: Admission: RE | Admit: 2015-05-22 | Payer: Self-pay | Source: Ambulatory Visit

## 2016-01-11 ENCOUNTER — Ambulatory Visit
Admission: RE | Admit: 2016-01-11 | Discharge: 2016-01-11 | Disposition: A | Discharge status: Home / Self Care | Payer: Medicare Other | Source: Ambulatory Visit | Attending: Family Medicine | Admitting: Family Medicine

## 2016-01-11 DIAGNOSIS — Z1231 Encounter for screening mammogram for malignant neoplasm of breast: Secondary | ICD-10-CM | POA: Insufficient documentation

## 2017-02-28 ENCOUNTER — Other Ambulatory Visit: Payer: Self-pay | Admitting: Family Medicine

## 2017-02-28 DIAGNOSIS — Z1231 Encounter for screening mammogram for malignant neoplasm of breast: Secondary | ICD-10-CM

## 2017-07-14 ENCOUNTER — Other Ambulatory Visit: Payer: Self-pay | Admitting: Surgery

## 2017-07-14 DIAGNOSIS — M12811 Other specific arthropathies, not elsewhere classified, right shoulder: Secondary | ICD-10-CM

## 2017-07-14 DIAGNOSIS — M75101 Unspecified rotator cuff tear or rupture of right shoulder, not specified as traumatic: Secondary | ICD-10-CM

## 2017-07-14 DIAGNOSIS — M19011 Primary osteoarthritis, right shoulder: Secondary | ICD-10-CM

## 2017-07-30 ENCOUNTER — Ambulatory Visit
Admission: RE | Admit: 2017-07-30 | Discharge: 2017-07-30 | Disposition: A | Discharge status: Home / Self Care | Payer: Medicare Other | Source: Ambulatory Visit | Attending: Surgery | Admitting: Surgery

## 2017-07-30 DIAGNOSIS — M19011 Primary osteoarthritis, right shoulder: Secondary | ICD-10-CM | POA: Insufficient documentation

## 2017-07-30 DIAGNOSIS — M11211 Other chondrocalcinosis, right shoulder: Secondary | ICD-10-CM | POA: Diagnosis not present

## 2017-07-30 DIAGNOSIS — M25711 Osteophyte, right shoulder: Secondary | ICD-10-CM | POA: Diagnosis not present

## 2017-07-30 DIAGNOSIS — M12811 Other specific arthropathies, not elsewhere classified, right shoulder: Secondary | ICD-10-CM

## 2017-07-30 DIAGNOSIS — M75101 Unspecified rotator cuff tear or rupture of right shoulder, not specified as traumatic: Secondary | ICD-10-CM

## 2018-01-26 ENCOUNTER — Other Ambulatory Visit: Payer: Self-pay | Admitting: Obstetrics and Gynecology

## 2018-01-26 DIAGNOSIS — Z1231 Encounter for screening mammogram for malignant neoplasm of breast: Secondary | ICD-10-CM

## 2018-02-27 ENCOUNTER — Other Ambulatory Visit: Payer: Self-pay

## 2018-02-27 ENCOUNTER — Encounter
Admission: RE | Admit: 2018-02-27 | Discharge: 2018-02-27 | Disposition: A | Discharge status: Home / Self Care | Payer: Medicare Other | Source: Ambulatory Visit | Attending: Surgery | Admitting: Surgery

## 2018-02-27 ENCOUNTER — Ambulatory Visit
Admission: RE | Admit: 2018-02-27 | Discharge: 2018-02-27 | Disposition: A | Discharge status: Home / Self Care | Payer: Medicare Other | Source: Ambulatory Visit | Attending: Surgery | Admitting: Surgery

## 2018-02-27 DIAGNOSIS — Z01818 Encounter for other preprocedural examination: Secondary | ICD-10-CM | POA: Diagnosis present

## 2018-02-27 DIAGNOSIS — Z01812 Encounter for preprocedural laboratory examination: Secondary | ICD-10-CM | POA: Insufficient documentation

## 2018-02-27 DIAGNOSIS — Z87891 Personal history of nicotine dependence: Secondary | ICD-10-CM | POA: Insufficient documentation

## 2018-02-27 DIAGNOSIS — Z01810 Encounter for preprocedural cardiovascular examination: Secondary | ICD-10-CM | POA: Insufficient documentation

## 2018-02-27 DIAGNOSIS — I10 Essential (primary) hypertension: Secondary | ICD-10-CM | POA: Insufficient documentation

## 2018-02-27 LAB — BASIC METABOLIC PANEL
Anion gap: 10 (ref 5–15)
BUN: 14 mg/dL (ref 6–20)
CO2: 27 mmol/L (ref 22–32)
CREATININE: 0.88 mg/dL (ref 0.44–1.00)
Calcium: 9.3 mg/dL (ref 8.9–10.3)
Chloride: 100 mmol/L — ABNORMAL LOW (ref 101–111)
GFR calc Af Amer: >60 mL/min (ref >60)
GFR calc non Af Amer: >60 mL/min (ref >60)
GLUCOSE: 94 mg/dL (ref 65–99)
Potassium: 3.6 mmol/L (ref 3.5–5.1)
SODIUM: 137 mmol/L (ref 135–145)

## 2018-02-27 LAB — URINALYSIS, ROUTINE W REFLEX MICROSCOPIC
BACTERIA UA: NONE SEEN
BILIRUBIN URINE: NEGATIVE
Glucose, UA: NEGATIVE mg/dL
HGB URINE DIPSTICK: NEGATIVE
KETONES UR: NEGATIVE mg/dL
NITRITE: NEGATIVE
Protein, ur: NEGATIVE mg/dL
Specific Gravity, Urine: 1.019 (ref 1.005–1.030)
pH: 6 (ref 5.0–8.0)

## 2018-02-27 LAB — CBC
HEMATOCRIT: 46.8 % (ref 35.0–47.0)
HEMOGLOBIN: 15.3 g/dL (ref 12.0–16.0)
MCH: 28.6 pg (ref 26.0–34.0)
MCHC: 32.8 g/dL (ref 32.0–36.0)
MCV: 87.4 fL (ref 80.0–100.0)
Platelets: 357 10*3/uL (ref 150–440)
RBC: 5.36 MIL/uL — ABNORMAL HIGH (ref 3.80–5.20)
RDW: 13.9 % (ref 11.5–14.5)
WBC: 9.7 10*3/uL (ref 3.6–11.0)

## 2018-02-27 LAB — SURGICAL PCR SCREEN
MRSA, PCR: NEGATIVE
STAPHYLOCOCCUS AUREUS: NEGATIVE

## 2018-02-27 LAB — PROTIME-INR
INR: 0.94
Prothrombin Time: 12.5 seconds (ref 11.4–15.2)

## 2018-02-27 LAB — TYPE AND SCREEN
ABO/RH(D): A NEG
Antibody Screen: NEGATIVE

## 2018-02-27 NOTE — Patient Instructions (Signed)
Your procedure is scheduled on: Tuesday 03/10/18 Report to DAY SURGERY DEPARTMENT LOCATED ON 2ND FLOOR MEDICAL MALL ENTRANCE. To find out your arrival time please call [phone] between 1PM - 3PM on Monday 03/09/18.  Remember: Instructions that are not followed completely may result in serious medical risk, up to and including death, or upon the discretion of your surgeon and anesthesiologist your surgery may need to be rescheduled.     _X__ 1. Do not eat food after midnight the night before your procedure.                 No gum chewing or hard candies. You may drink clear liquids up to 2 hours                 before you are scheduled to arrive for your surgery- DO not drink clear                 liquids within 2 hours of the start of your surgery.                 Clear Liquids include:  water, apple juice without pulp, clear carbohydrate                 drink such as Clearfast or Gatorade, Black Coffee or Tea (Do not add                 anything to coffee or tea).  __X__2.  On the morning of surgery brush your teeth with toothpaste and water, you                 may rinse your mouth with mouthwash if you wish.  Do not swallow any              toothpaste of mouthwash.     _X__ 3.  No Alcohol for 24 hours before or after surgery.   _X__ 4.  Do Not Smoke or use e-cigarettes For 24 Hours Prior to Your Surgery.                 Do not use any chewable tobacco products for at least 6 hours prior to                 surgery.  ____  5.  Bring all medications with you on the day of surgery if instructed.   __X__  6.  Notify your doctor if there is any change in your medical condition      (cold, fever, infections).     Do not wear jewelry, make-up, hairpins, clips or nail polish. Do not wear lotions, powders, or perfumes.  Do not shave 48 hours prior to surgery. Men may shave face and neck. Do not bring valuables to the hospital.    [facility] is not responsible for any belongings or  valuables.  Contacts, dentures/partials or body piercings may not be worn into surgery. Bring a case for your contacts, glasses or hearing aids, a denture cup will be supplied. Leave your suitcase in the car. After surgery it may be brought to your room. For patients admitted to the hospital, discharge time is determined by your treatment team.   Patients discharged the day of surgery will not be allowed to drive home.   Please read over the following fact sheets that you were given:   MRSA Information  __X__ Take these medicines the morning of surgery with A SIP OF WATER:  1. ATORVASTATIN  2. CETIRIZINE  3.   4.  5.  6.  ____ Fleet Enema (as directed)   __X__ Use CHG Soap/SAGE wipes as directed  __X__ Use inhalers on the day of surgery (USE YOUR NEBULIZER BEFORE ARRIVING)  ____ Stop metformin/Janumet/Farxiga 2 days prior to surgery    ____ Take 1/2 of usual insulin dose the night before surgery. No insulin the morning          of surgery.   __X__ Stop Blood Thinners Coumadin/Plavix/Xarelto/Pleta/Pradaxa/Eliquis/Effient/Aspirin  on 03/03/18  Or contact your Surgeon, Cardiologist or Medical Doctor regarding  ability to stop your blood thinners  __X__ Stop Anti-inflammatories 7 days before surgery such as Advil, Ibuprofen, Motrin,  BC or Goodies Powder, Naprosyn, Naproxen, Aleve, Aspirin    __X__ Stop all herbal OR OTHER supplements, fish oil or vitamin E until after surgery.    ____ Bring C-Pap to the hospital.

## 2018-03-09 MED ORDER — CEFAZOLIN SODIUM-DEXTROSE 2-4 GM/100ML-% IV SOLN
2.0000 g | Freq: Once | INTRAVENOUS | Status: AC
  Administered 2018-03-10: 2 g via INTRAVENOUS

## 2018-03-10 ENCOUNTER — Inpatient Hospital Stay: Payer: Medicare Other | Admitting: Anesthesiology

## 2018-03-10 ENCOUNTER — Encounter
Admission: RE | Disposition: A | Discharge status: Home Health | Payer: Self-pay | Source: Ambulatory Visit | Attending: Surgery

## 2018-03-10 ENCOUNTER — Inpatient Hospital Stay
Admission: RE | Admit: 2018-03-10 | Discharge: 2018-03-11 | DRG: 483 | Disposition: A | Discharge status: Home Health | Payer: Medicare Other | Source: Ambulatory Visit | Attending: Surgery | Admitting: Surgery

## 2018-03-10 ENCOUNTER — Inpatient Hospital Stay: Payer: Medicare Other

## 2018-03-10 ENCOUNTER — Other Ambulatory Visit: Payer: Self-pay

## 2018-03-10 ENCOUNTER — Encounter: Payer: Self-pay | Admitting: *Deleted

## 2018-03-10 DIAGNOSIS — J45909 Unspecified asthma, uncomplicated: Secondary | ICD-10-CM | POA: Diagnosis present

## 2018-03-10 DIAGNOSIS — Z8249 Family history of ischemic heart disease and other diseases of the circulatory system: Secondary | ICD-10-CM

## 2018-03-10 DIAGNOSIS — Z79890 Hormone replacement therapy (postmenopausal): Secondary | ICD-10-CM | POA: Diagnosis not present

## 2018-03-10 DIAGNOSIS — E785 Hyperlipidemia, unspecified: Secondary | ICD-10-CM | POA: Diagnosis present

## 2018-03-10 DIAGNOSIS — Z87891 Personal history of nicotine dependence: Secondary | ICD-10-CM

## 2018-03-10 DIAGNOSIS — I10 Essential (primary) hypertension: Secondary | ICD-10-CM | POA: Diagnosis present

## 2018-03-10 DIAGNOSIS — Z7982 Long term (current) use of aspirin: Secondary | ICD-10-CM

## 2018-03-10 DIAGNOSIS — J449 Chronic obstructive pulmonary disease, unspecified: Secondary | ICD-10-CM | POA: Diagnosis present

## 2018-03-10 DIAGNOSIS — M19011 Primary osteoarthritis, right shoulder: Principal | ICD-10-CM | POA: Diagnosis present

## 2018-03-10 DIAGNOSIS — Z96612 Presence of left artificial shoulder joint: Secondary | ICD-10-CM | POA: Diagnosis present

## 2018-03-10 DIAGNOSIS — Z96611 Presence of right artificial shoulder joint: Secondary | ICD-10-CM

## 2018-03-10 HISTORY — PX: REVERSE SHOULDER ARTHROPLASTY: SHX5054

## 2018-03-10 LAB — ABO/RH: ABO/RH(D): A NEG

## 2018-03-10 SURGERY — ARTHROPLASTY, SHOULDER, TOTAL, REVERSE
Anesthesia: General | Site: Shoulder | Laterality: Right | Wound class: Clean

## 2018-03-10 MED ORDER — ENOXAPARIN SODIUM 40 MG/0.4ML ~~LOC~~ SOLN: 40.0000 mg | SUBCUTANEOUS | Status: DC

## 2018-03-10 MED ORDER — PROPOFOL 10 MG/ML IV BOLUS
INTRAVENOUS | Status: DC | PRN
  Administered 2018-03-10 (×2): 50 mg via INTRAVENOUS
  Administered 2018-03-10: 150 mg via INTRAVENOUS
  Administered 2018-03-10 (×3): 50 mg via INTRAVENOUS

## 2018-03-10 MED ORDER — METOCLOPRAMIDE HCL 5 MG/ML IJ SOLN: 5.0000 mg | Freq: Three times a day (TID) | INTRAMUSCULAR | Status: DC | PRN

## 2018-03-10 MED ORDER — MIDAZOLAM HCL 2 MG/2ML IJ SOLN
1.0000 mg | Freq: Once | INTRAMUSCULAR | Status: AC
  Administered 2018-03-10: 1 mg via INTRAVENOUS

## 2018-03-10 MED ORDER — ACETAMINOPHEN 325 MG PO TABS: 325.0000 mg | ORAL_TABLET | Freq: Four times a day (QID) | ORAL | Status: DC | PRN

## 2018-03-10 MED ORDER — FENTANYL CITRATE (PF) 100 MCG/2ML IJ SOLN
50.0000 ug | Freq: Once | INTRAMUSCULAR | Status: AC
  Administered 2018-03-10: 50 ug via INTRAVENOUS

## 2018-03-10 MED ORDER — HYDROMORPHONE HCL 1 MG/ML IJ SOLN
INTRAMUSCULAR | Status: AC
  Filled 2018-03-10: qty 1

## 2018-03-10 MED ORDER — DEXAMETHASONE SODIUM PHOSPHATE 10 MG/ML IJ SOLN
INTRAMUSCULAR | Status: AC
  Filled 2018-03-10: qty 1

## 2018-03-10 MED ORDER — BUPIVACAINE-EPINEPHRINE (PF) 0.5% -1:200000 IJ SOLN
INTRAMUSCULAR | Status: AC
  Filled 2018-03-10: qty 30

## 2018-03-10 MED ORDER — ALBUTEROL SULFATE (2.5 MG/3ML) 0.083% IN NEBU: 2.5000 mg | INHALATION_SOLUTION | Freq: Four times a day (QID) | RESPIRATORY_TRACT | Status: DC | PRN

## 2018-03-10 MED ORDER — KETOROLAC TROMETHAMINE 15 MG/ML IJ SOLN
INTRAMUSCULAR | Status: AC
  Administered 2018-03-10: 15 mg via INTRAVENOUS
  Filled 2018-03-10: qty 1

## 2018-03-10 MED ORDER — PROPOFOL 10 MG/ML IV BOLUS
INTRAVENOUS | Status: AC
  Filled 2018-03-10: qty 20

## 2018-03-10 MED ORDER — NEOMYCIN-POLYMYXIN B GU 40-200000 IR SOLN
Status: DC | PRN
  Administered 2018-03-10: 16 mL

## 2018-03-10 MED ORDER — TRETINOIN 0.025 % EX CREA: 1.0000 "application " | TOPICAL_CREAM | Freq: Every day | CUTANEOUS | Status: DC

## 2018-03-10 MED ORDER — DEXAMETHASONE SODIUM PHOSPHATE 10 MG/ML IJ SOLN
INTRAMUSCULAR | Status: DC | PRN
  Administered 2018-03-10: 10 mg via INTRAVENOUS

## 2018-03-10 MED ORDER — EPINEPHRINE PF 1 MG/ML IJ SOLN
INTRAMUSCULAR | Status: AC
Start: 2018-03-10 — End: 2018-03-10
  Filled 2018-03-10: qty 1

## 2018-03-10 MED ORDER — SODIUM CHLORIDE 0.9 % IV SOLN
INTRAVENOUS | Status: DC | PRN
  Administered 2018-03-10: 25 ug/min via INTRAVENOUS

## 2018-03-10 MED ORDER — MIDAZOLAM HCL 2 MG/2ML IJ SOLN
INTRAMUSCULAR | Status: AC
  Filled 2018-03-10: qty 2

## 2018-03-10 MED ORDER — LIDOCAINE HCL (CARDIAC) 20 MG/ML IV SOLN
INTRAVENOUS | Status: DC | PRN
  Administered 2018-03-10: 50 mg via INTRAVENOUS

## 2018-03-10 MED ORDER — PHENYLEPHRINE HCL 10 MG/ML IJ SOLN
INTRAMUSCULAR | Status: AC
  Filled 2018-03-10: qty 1

## 2018-03-10 MED ORDER — LIDOCAINE HCL (PF) 1 % IJ SOLN
INTRAMUSCULAR | Status: DC | PRN
  Administered 2018-03-10: .8 mL via SUBCUTANEOUS

## 2018-03-10 MED ORDER — TRAMADOL HCL 50 MG PO TABS: 50.0000 mg | ORAL_TABLET | Freq: Four times a day (QID) | ORAL | Status: DC | PRN

## 2018-03-10 MED ORDER — METOCLOPRAMIDE HCL 10 MG PO TABS: 5.0000 mg | ORAL_TABLET | Freq: Three times a day (TID) | ORAL | Status: DC | PRN

## 2018-03-10 MED ORDER — TRANEXAMIC ACID 1000 MG/10ML IV SOLN
INTRAVENOUS | Status: AC
  Filled 2018-03-10: qty 10

## 2018-03-10 MED ORDER — OXYCODONE HCL 5 MG PO TABS
5.0000 mg | ORAL_TABLET | ORAL | Status: DC | PRN
  Administered 2018-03-10: 5 mg via ORAL
  Filled 2018-03-10: qty 1

## 2018-03-10 MED ORDER — SUGAMMADEX SODIUM 200 MG/2ML IV SOLN
INTRAVENOUS | Status: AC
  Filled 2018-03-10: qty 2

## 2018-03-10 MED ORDER — SODIUM CHLORIDE 0.9 % IV SOLN
INTRAVENOUS | Status: DC
  Administered 2018-03-10: 12:00:00 via INTRAVENOUS

## 2018-03-10 MED ORDER — FENTANYL CITRATE (PF) 100 MCG/2ML IJ SOLN
INTRAMUSCULAR | Status: AC
  Administered 2018-03-10: 50 ug via INTRAVENOUS
  Filled 2018-03-10: qty 2

## 2018-03-10 MED ORDER — LIDOCAINE HCL (PF) 2 % IJ SOLN
INTRAMUSCULAR | Status: AC
  Filled 2018-03-10: qty 10

## 2018-03-10 MED ORDER — ALBUTEROL SULFATE HFA 108 (90 BASE) MCG/ACT IN AERS: 2.0000 | INHALATION_SPRAY | RESPIRATORY_TRACT | Status: DC | PRN

## 2018-03-10 MED ORDER — PROPOFOL 10 MG/ML IV BOLUS
INTRAVENOUS | Status: AC
  Filled 2018-03-10: qty 40

## 2018-03-10 MED ORDER — NEOMYCIN-POLYMYXIN B GU 40-200000 IR SOLN
Status: AC
  Filled 2018-03-10: qty 20

## 2018-03-10 MED ORDER — ASPIRIN-ACETAMINOPHEN-CAFFEINE 250-250-65 MG PO TABS
2.0000 | ORAL_TABLET | Freq: Four times a day (QID) | ORAL | Status: DC | PRN
Start: 2018-03-10 — End: 2018-03-11
  Filled 2018-03-10: qty 2

## 2018-03-10 MED ORDER — SODIUM CHLORIDE 0.9 % IV SOLN
INTRAVENOUS | Status: DC | PRN
  Administered 2018-03-10: 60 mL

## 2018-03-10 MED ORDER — MAGNESIUM HYDROXIDE 400 MG/5ML PO SUSP: 30.0000 mL | Freq: Every day | ORAL | Status: DC | PRN

## 2018-03-10 MED ORDER — PROPOFOL 500 MG/50ML IV EMUL
INTRAVENOUS | Status: DC | PRN
  Administered 2018-03-10: 25 ug/kg/min via INTRAVENOUS

## 2018-03-10 MED ORDER — HYDROMORPHONE HCL 1 MG/ML IJ SOLN
INTRAMUSCULAR | Status: DC | PRN
  Administered 2018-03-10: 1 mg via INTRAVENOUS

## 2018-03-10 MED ORDER — PANTOPRAZOLE SODIUM 40 MG PO TBEC
40.0000 mg | DELAYED_RELEASE_TABLET | Freq: Every day | ORAL | Status: DC
  Administered 2018-03-10: 40 mg via ORAL
  Filled 2018-03-10: qty 1

## 2018-03-10 MED ORDER — LACTATED RINGERS IV SOLN
INTRAVENOUS | Status: DC
  Administered 2018-03-10 (×2): via INTRAVENOUS

## 2018-03-10 MED ORDER — MIDAZOLAM HCL 2 MG/2ML IJ SOLN
INTRAMUSCULAR | Status: DC | PRN
  Administered 2018-03-10 (×2): 1 mg via INTRAVENOUS

## 2018-03-10 MED ORDER — BUPIVACAINE-EPINEPHRINE (PF) 0.5% -1:200000 IJ SOLN
INTRAMUSCULAR | Status: DC | PRN
  Administered 2018-03-10: 30 mL via PERINEURAL

## 2018-03-10 MED ORDER — ROPIVACAINE HCL 5 MG/ML IJ SOLN
INTRAMUSCULAR | Status: AC
Start: 2018-03-10 — End: 2018-03-10
  Filled 2018-03-10: qty 30

## 2018-03-10 MED ORDER — ONDANSETRON HCL 4 MG/2ML IJ SOLN
4.0000 mg | Freq: Once | INTRAMUSCULAR | Status: AC | PRN
  Administered 2018-03-10: 4 mg via INTRAVENOUS

## 2018-03-10 MED ORDER — FLEET ENEMA 7-19 GM/118ML RE ENEM: 1.0000 | ENEMA | Freq: Once | RECTAL | Status: DC | PRN

## 2018-03-10 MED ORDER — ONDANSETRON HCL 4 MG PO TABS: 4.0000 mg | ORAL_TABLET | Freq: Four times a day (QID) | ORAL | Status: DC | PRN

## 2018-03-10 MED ORDER — KETOROLAC TROMETHAMINE 30 MG/ML IJ SOLN
7.5000 mg | Freq: Four times a day (QID) | INTRAMUSCULAR | Status: DC
  Administered 2018-03-10 – 2018-03-11 (×3): 7.5 mg via INTRAVENOUS
  Filled 2018-03-10 (×3): qty 1

## 2018-03-10 MED ORDER — FENTANYL CITRATE (PF) 100 MCG/2ML IJ SOLN
INTRAMUSCULAR | Status: DC | PRN
  Administered 2018-03-10: 100 ug via INTRAVENOUS

## 2018-03-10 MED ORDER — ONDANSETRON HCL 4 MG/2ML IJ SOLN: 4.0000 mg | Freq: Four times a day (QID) | INTRAMUSCULAR | Status: DC | PRN

## 2018-03-10 MED ORDER — ACETAMINOPHEN 500 MG PO TABS
1000.0000 mg | ORAL_TABLET | Freq: Four times a day (QID) | ORAL | Status: DC
  Administered 2018-03-10 – 2018-03-11 (×3): 1000 mg via ORAL
  Filled 2018-03-10 (×4): qty 2

## 2018-03-10 MED ORDER — ACETAMINOPHEN 10 MG/ML IV SOLN
INTRAVENOUS | Status: AC
  Filled 2018-03-10: qty 100

## 2018-03-10 MED ORDER — FAMOTIDINE 20 MG PO TABS
ORAL_TABLET | ORAL | Status: AC
  Administered 2018-03-10: 20 mg via ORAL
  Filled 2018-03-10: qty 1

## 2018-03-10 MED ORDER — SODIUM CHLORIDE FLUSH 0.9 % IV SOLN
INTRAVENOUS | Status: AC
  Filled 2018-03-10: qty 10

## 2018-03-10 MED ORDER — GLYCOPYRROLATE 0.2 MG/ML IJ SOLN
INTRAMUSCULAR | Status: AC
  Filled 2018-03-10: qty 1

## 2018-03-10 MED ORDER — TRIAMTERENE-HCTZ 37.5-25 MG PO TABS
1.0000 | ORAL_TABLET | Freq: Every day | ORAL | Status: DC
  Filled 2018-03-10: qty 1

## 2018-03-10 MED ORDER — ROPIVACAINE HCL 5 MG/ML IJ SOLN
INTRAMUSCULAR | Status: DC | PRN
  Administered 2018-03-10: 30 mL via EPIDURAL

## 2018-03-10 MED ORDER — PHENYLEPHRINE HCL 10 MG/ML IJ SOLN
INTRAMUSCULAR | Status: DC | PRN
  Administered 2018-03-10: 200 ug via INTRAVENOUS

## 2018-03-10 MED ORDER — BISACODYL 10 MG RE SUPP: 10.0000 mg | Freq: Every day | RECTAL | Status: DC | PRN

## 2018-03-10 MED ORDER — ACETAMINOPHEN 10 MG/ML IV SOLN
INTRAVENOUS | Status: DC | PRN
  Administered 2018-03-10: 1000 mg via INTRAVENOUS

## 2018-03-10 MED ORDER — LORATADINE 10 MG PO TABS
10.0000 mg | ORAL_TABLET | Freq: Every day | ORAL | Status: DC
  Administered 2018-03-10: 10 mg via ORAL
  Filled 2018-03-10: qty 1

## 2018-03-10 MED ORDER — SUGAMMADEX SODIUM 200 MG/2ML IV SOLN
INTRAVENOUS | Status: DC | PRN
  Administered 2018-03-10: 200 mg via INTRAVENOUS

## 2018-03-10 MED ORDER — SCOPOLAMINE 1 MG/3DAYS TD PT72
1.0000 | MEDICATED_PATCH | Freq: Once | TRANSDERMAL | Status: DC
  Administered 2018-03-10: 1.5 mg via TRANSDERMAL

## 2018-03-10 MED ORDER — CEFAZOLIN SODIUM-DEXTROSE 2-4 GM/100ML-% IV SOLN
INTRAVENOUS | Status: AC
  Filled 2018-03-10: qty 100

## 2018-03-10 MED ORDER — LIDOCAINE HCL (PF) 1 % IJ SOLN
INTRAMUSCULAR | Status: AC
  Filled 2018-03-10: qty 5

## 2018-03-10 MED ORDER — ROCURONIUM BROMIDE 50 MG/5ML IV SOLN
INTRAVENOUS | Status: AC
  Filled 2018-03-10: qty 1

## 2018-03-10 MED ORDER — ARTIFICIAL TEARS OPHTHALMIC OINT
TOPICAL_OINTMENT | OPHTHALMIC | Status: AC
  Filled 2018-03-10: qty 3.5

## 2018-03-10 MED ORDER — ATORVASTATIN CALCIUM 20 MG PO TABS
40.0000 mg | ORAL_TABLET | Freq: Every day | ORAL | Status: DC
  Administered 2018-03-10: 40 mg via ORAL
  Filled 2018-03-10: qty 2

## 2018-03-10 MED ORDER — KETOROLAC TROMETHAMINE 15 MG/ML IJ SOLN
15.0000 mg | Freq: Once | INTRAMUSCULAR | Status: AC
  Administered 2018-03-10: 15 mg via INTRAVENOUS

## 2018-03-10 MED ORDER — HYDROMORPHONE HCL 1 MG/ML IJ SOLN: 0.5000 mg | INTRAMUSCULAR | Status: DC | PRN

## 2018-03-10 MED ORDER — SODIUM CHLORIDE 0.9 % IJ SOLN
INTRAMUSCULAR | Status: AC
  Filled 2018-03-10: qty 50

## 2018-03-10 MED ORDER — ONDANSETRON HCL 4 MG/2ML IJ SOLN
INTRAMUSCULAR | Status: AC
  Filled 2018-03-10: qty 2

## 2018-03-10 MED ORDER — FENTANYL CITRATE (PF) 100 MCG/2ML IJ SOLN: 25.0000 ug | INTRAMUSCULAR | Status: DC | PRN

## 2018-03-10 MED ORDER — SUMATRIPTAN SUCCINATE 50 MG PO TABS
50.0000 mg | ORAL_TABLET | ORAL | Status: DC | PRN
  Administered 2018-03-10: 50 mg via ORAL
  Filled 2018-03-10 (×3): qty 1

## 2018-03-10 MED ORDER — FAMOTIDINE 20 MG PO TABS
20.0000 mg | ORAL_TABLET | Freq: Once | ORAL | Status: AC
  Administered 2018-03-10: 20 mg via ORAL

## 2018-03-10 MED ORDER — ASPIRIN EC 81 MG PO TBEC
81.0000 mg | DELAYED_RELEASE_TABLET | Freq: Every day | ORAL | Status: DC
  Administered 2018-03-10: 81 mg via ORAL
  Filled 2018-03-10: qty 1

## 2018-03-10 MED ORDER — ADULT MULTIVITAMIN W/MINERALS CH
1.0000 | ORAL_TABLET | Freq: Every day | ORAL | Status: DC
  Administered 2018-03-10: 1 via ORAL
  Filled 2018-03-10: qty 1

## 2018-03-10 MED ORDER — TRANEXAMIC ACID 1000 MG/10ML IV SOLN
INTRAVENOUS | Status: DC | PRN
  Administered 2018-03-10: 1000 mg via TOPICAL

## 2018-03-10 MED ORDER — ROCURONIUM BROMIDE 100 MG/10ML IV SOLN
INTRAVENOUS | Status: DC | PRN
  Administered 2018-03-10: 50 mg via INTRAVENOUS

## 2018-03-10 MED ORDER — DOCUSATE SODIUM 100 MG PO CAPS
100.0000 mg | ORAL_CAPSULE | Freq: Two times a day (BID) | ORAL | Status: DC
  Administered 2018-03-10 (×2): 100 mg via ORAL
  Filled 2018-03-10 (×2): qty 1

## 2018-03-10 MED ORDER — CEFAZOLIN SODIUM-DEXTROSE 2-4 GM/100ML-% IV SOLN
2.0000 g | Freq: Four times a day (QID) | INTRAVENOUS | Status: AC
  Administered 2018-03-10 – 2018-03-11 (×3): 2 g via INTRAVENOUS
  Filled 2018-03-10 (×3): qty 100

## 2018-03-10 MED ORDER — BUPIVACAINE LIPOSOME 1.3 % IJ SUSP
INTRAMUSCULAR | Status: AC
  Filled 2018-03-10: qty 20

## 2018-03-10 MED ORDER — DIPHENHYDRAMINE HCL 12.5 MG/5ML PO ELIX: 12.5000 mg | ORAL_SOLUTION | ORAL | Status: DC | PRN

## 2018-03-10 MED ORDER — SCOPOLAMINE 1 MG/3DAYS TD PT72
MEDICATED_PATCH | TRANSDERMAL | Status: AC
  Administered 2018-03-10: 1.5 mg via TRANSDERMAL
  Filled 2018-03-10: qty 1

## 2018-03-10 MED ORDER — FENTANYL CITRATE (PF) 100 MCG/2ML IJ SOLN
INTRAMUSCULAR | Status: AC
  Filled 2018-03-10: qty 2

## 2018-03-10 MED ORDER — MIDAZOLAM HCL 2 MG/2ML IJ SOLN
INTRAMUSCULAR | Status: AC
Start: 2018-03-10 — End: 2018-03-10
  Administered 2018-03-10: 1 mg via INTRAVENOUS
  Filled 2018-03-10: qty 2

## 2018-03-10 MED ORDER — GLYCOPYRROLATE 0.2 MG/ML IJ SOLN
INTRAMUSCULAR | Status: DC | PRN
  Administered 2018-03-10: 0.1 mg via INTRAVENOUS

## 2018-03-10 SURGICAL SUPPLY — 68 items
BIT DRILL 2.5 (BIT) ×1
BIT DRILL 2.5X4.5XSCR (BIT) ×1 IMPLANT
BIT DRL 2.5X4.5XSCR (BIT) ×1
BLADE SAGITTAL WIDE XTHICK NO (BLADE) ×3 IMPLANT
CANISTER SUCT 1200ML W/VALVE (MISCELLANEOUS) ×3 IMPLANT
CANISTER SUCT 3000ML PPV (MISCELLANEOUS) ×6 IMPLANT
CHLORAPREP W/TINT 26ML (MISCELLANEOUS) ×6 IMPLANT
COOLER POLAR GLACIER W/PUMP (MISCELLANEOUS) ×3 IMPLANT
CRADLE LAMINECT ARM (MISCELLANEOUS) ×3 IMPLANT
DRAPE IMP U-DRAPE 54X76 (DRAPES) ×6 IMPLANT
DRAPE INCISE IOBAN 66X45 STRL (DRAPES) ×6 IMPLANT
DRAPE INCISE IOBAN 66X60 STRL (DRAPES) ×3 IMPLANT
DRAPE SHEET LG 3/4 BI-LAMINATE (DRAPES) ×6 IMPLANT
DRAPE TABLE BACK 80X90 (DRAPES) ×3 IMPLANT
DRILL 2.5MM (BIT) ×1
DRSG OPSITE POSTOP 4X8 (GAUZE/BANDAGES/DRESSINGS) ×3 IMPLANT
ELECT BLADE 6.5 EXT (BLADE) ×3 IMPLANT
ELECT CAUTERY BLADE 6.4 (BLADE) ×3 IMPLANT
GLENOSPHERE RSS 2 CONCENTRIC (Shoulder) ×3 IMPLANT
GLOVE BIO SURGEON STRL SZ7.5 (GLOVE) ×12 IMPLANT
GLOVE BIO SURGEON STRL SZ8 (GLOVE) ×12 IMPLANT
GLOVE BIOGEL PI IND STRL 7.0 (GLOVE) ×4 IMPLANT
GLOVE BIOGEL PI IND STRL 8 (GLOVE) ×1 IMPLANT
GLOVE BIOGEL PI INDICATOR 7.0 (GLOVE) ×8
GLOVE BIOGEL PI INDICATOR 8 (GLOVE) ×2
GLOVE INDICATOR 8.0 STRL GRN (GLOVE) ×3 IMPLANT
GOWN STRL REUS W/ TWL LRG LVL3 (GOWN DISPOSABLE) ×2 IMPLANT
GOWN STRL REUS W/ TWL XL LVL3 (GOWN DISPOSABLE) ×2 IMPLANT
GOWN STRL REUS W/TWL LRG LVL3 (GOWN DISPOSABLE) ×4
GOWN STRL REUS W/TWL XL LVL3 (GOWN DISPOSABLE) ×4
GUIDE PIN 2.0 S150MM (PIN) ×3 IMPLANT
HOOD PEEL AWAY FLYTE STAYCOOL (MISCELLANEOUS) ×9 IMPLANT
KIT STABILIZATION SHOULDER (MISCELLANEOUS) ×3 IMPLANT
KIT TURNOVER KIT A (KITS) ×3 IMPLANT
LINER SHOULDER STD 3 (Liner) ×3 IMPLANT
MASK FACE SPIDER DISP (MASK) ×3 IMPLANT
MAT BLUE FLOOR 46X72 FLO (MISCELLANEOUS) ×3 IMPLANT
NDL SAFETY ECLIPSE 18X1.5 (NEEDLE) ×1 IMPLANT
NEEDLE HYPO 18GX1.5 SHARP (NEEDLE) ×2
NEEDLE HYPO 22GX1.5 SAFETY (NEEDLE) ×3 IMPLANT
NEEDLE SPNL 20GX3.5 QUINCKE YW (NEEDLE) ×3 IMPLANT
NS IRRIG 500ML POUR BTL (IV SOLUTION) ×3 IMPLANT
PACK ARTHROSCOPY SHOULDER (MISCELLANEOUS) ×3 IMPLANT
PAD WRAPON POLAR SHDR UNIV (MISCELLANEOUS) ×1 IMPLANT
PLATE BASE REVERSE RSS S (Plate) ×3 IMPLANT
PULSAVAC PLUS IRRIG FAN TIP (DISPOSABLE) ×3
SCREW 4.5X15 RSS W CAP (Screw) ×6 IMPLANT
SCREW 4.5X20 RSS W CAP (Screw) ×6 IMPLANT
SCREW 4.5X25 RSS W CAP (Screw) ×3 IMPLANT
SCREW BODY REVERSE SMALL TITAN (Screw) ×3 IMPLANT
SLING ULTRA II M (MISCELLANEOUS) ×3 IMPLANT
SOL .9 NS 3000ML IRR  AL (IV SOLUTION) ×2
SOL .9 NS 3000ML IRR UROMATIC (IV SOLUTION) ×1 IMPLANT
SPONGE LAP 18X18 5 PK (GAUZE/BANDAGES/DRESSINGS) ×3 IMPLANT
STAPLER SKIN PROX 35W (STAPLE) ×3 IMPLANT
STEM PRES FIT SZ 12 TSS (Stem) ×3 IMPLANT
SUT ETHIBOND 0 MO6 C/R (SUTURE) ×3 IMPLANT
SUT FIBERWIRE #2 38 BLUE 1/2 (SUTURE) ×12
SUT VIC AB 0 CT1 36 (SUTURE) ×6 IMPLANT
SUT VIC AB 2-0 CT1 27 (SUTURE) ×4
SUT VIC AB 2-0 CT1 TAPERPNT 27 (SUTURE) ×2 IMPLANT
SUTURE FIBERWR #2 38 BLUE 1/2 (SUTURE) ×4 IMPLANT
SYR 10ML LL (SYRINGE) ×3 IMPLANT
SYR 30ML LL (SYRINGE) ×6 IMPLANT
TIP FAN IRRIG PULSAVAC PLUS (DISPOSABLE) ×1 IMPLANT
TRAY FOLEY W/METER SILVER 16FR (SET/KITS/TRAYS/PACK) IMPLANT
WATER STERILE IRR 1000ML POUR (IV SOLUTION) ×3 IMPLANT
WRAPON POLAR PAD SHDR UNIV (MISCELLANEOUS) ×3

## 2018-03-10 NOTE — Care Management Note (Signed)
Case Management Note  Patient Details  Name: Paige Higgins MRN: [id number] Date of Birth: May 27, 1946  Subjective/Objective:   POD # 0  Reverse shoulder arthroplasty. Met with patient and her family at bedside. She lives alone but has friends that will be coming to stay with her. She plan on returning to work rather quickly. Discuss home health PT/OT. Offered a list of agencies. Referral to [facility] for PT/ OT. Patient should need no DME. PCP is Dr. Kary Kos.                Action/Plan: [facility] for PT and OT.   Expected Discharge Date:                  Expected Discharge Plan:  [place]  In-House Referral:     Discharge planning Services  CM Consult  Post Acute Care Choice:  Home Health Choice offered to:  Patient  DME Arranged:    DME Agency:     HH Arranged:  OT, PT [place] Agency:  [facility] (formerly [employer])  Status of Service:  In process, will continue to follow  If discussed at Long Length of Stay Meetings, dates discussed:    Additional Comments:  Jolly Mango, RN 03/10/2018, 2:06 PM

## 2018-03-10 NOTE — Anesthesia Procedure Notes (Signed)
Procedure Name: Intubation Date/Time: 03/10/2018 7:43 AM Performed by: Sherol DadeMacMang, Meaghen Vecchiarelli H, CRNA Pre-anesthesia Checklist: Patient identified, Emergency Drugs available, Suction available, Patient being monitored and Timeout performed Patient Re-evaluated:Patient Re-evaluated prior to induction Oxygen Delivery Method: Circle system utilized Preoxygenation: Pre-oxygenation with 100% oxygen Induction Type: IV induction Ventilation: Mask ventilation without difficulty Laryngoscope Size: Miller and 2 Grade View: Grade I Tube type: Oral Tube size: 7.0 mm Number of attempts: 1 Airway Equipment and Method: Stylet Placement Confirmation: ETT inserted through vocal cords under direct vision,  positive ETCO2,  CO2 detector and breath sounds checked- equal and bilateral Secured at: 20 cm Tube secured with: Tape Dental Injury: Teeth and Oropharynx as per pre-operative assessment

## 2018-03-10 NOTE — H&P (Signed)
Paper H&P to be scanned into permanent record. H&P reviewed and patient re-examined. No changes. 

## 2018-03-10 NOTE — Op Note (Addendum)
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  Post-Op Diagnosis:   Same.  Procedure:   Reverse right total shoulder arthroplasty.  Surgeon:   Maryagnes Amos, MD  Assistant:   Horris Latino, PA-C  Anesthesia:   General endotracheal with an interscalene block placed preoperatively by the anesthesiologist.  Findings:   As above.  Complications:   None  EBL:   300 cc  Fluids:   500 cc crystalloid  UOP:   None  TT:   None  Drains:   None  Closure:   Staples  Implants:   All press-fit Integra system with a 12 mm stem, a small metaphyseal body, a +3 mm humeral platform, a mini baseplate, and a 38 mm concentric +2 mm laterally offset glenosphere.  Brief Clinical Note:   The patient is a 72 year old female with a long history of gradually worsening right shoulder pain. Her symptoms have progressed despite medications, activity modification, etc. Her history and examination are consistent with advanced degenerative joint disease of the glenohumeral joint confirmed by plain radiographs. An MRI scan has demonstrated advanced cuff tendinopathy with partial-thickness tearing of the supraspinatus and subscapularis tendons. The patient presents at this time for a reverse right total shoulder arthroplasty.  Procedure:   The patient underwent placement of an interscalene block by the anesthesiologist in the preoperative holding area before being brought into the operating room and lain in the supine position. The patient then underwent general endotracheal intubation and anesthesia before the patient was repositioned in the beach chair position using the beach chair positioner. The right shoulder and upper extremity were prepped with ChloraPrep solution before being draped sterilely. Preoperative antibiotics were administered. A standard anterior approach to the  shoulder was made through an approximately 4-5 inch incision. The incision was carried down through the subcutaneous tissues to expose the deltopectoral fascia. The interval between the deltoid and pectoralis muscles was identified and this plane developed, retracting the cephalic vein laterally with the deltoid muscle. The conjoined tendon was identified. Its lateral margin was dissected and the Kolbel self-retraining retractor inserted. The "three sisters" were identified and cauterized. Bursal tissues were removed to improve visualization. The subscapularis tendon was released from its attachment to the lesser tuberosity 1 cm proximal to its insertion and several tagging sutures placed. The inferior capsule was released with care after identifying and protecting the axillary nerve. The proximal humeral cut was made at approximately 20 of retroversion using the extra-medullary guide.   Attention was redirected to the glenoid. The labrum was debrided circumferentially before the center of the glenoid was identified. The guidewire was drilled into the glenoid neck using the appropriate guide. After verifying its position, it was overreamed with the mini-baseplate reamer to create a flat surface before the stem reamer was utilized. The superior and inferior peg sites were reamed using the appropriate guide to complete the glenoid preparation. The permanent mini-baseplate was impacted into place. It was stabilized with a 25 x 4.5 mm central screw and four peripheral screws. Locking caps were placed over the superior and inferior screws. The permanent 38 mm concentric glenosphere with +2 mm of lateral offset was then impacted into place and its Morse taper locking mechanism verified using manual distraction.  Attention was directed to the humeral side. The humeral canal was prepared utilizing the tapered stem reamers sequentially beginning with the 7 mm stem  and progressing to a 12 mm stem. This demonstrated a  good tight fit. The metaphyseal region was then prepared using the appropriate planar device. The trial body and small stem was put together on the back table and a trial reduction performed using the +0 mm and +3 mm inserts. With the past 3 mm insert, the arm demonstrated excellent range of motion as the hand could be brought across the chest to the opposite shoulder and brought to the top of the patient's head and to the patient's ear. The shoulder appeared stable throughout this range of motion. The joint was dislocated and the trial components removed. The permanent 12 mm stem with the small body was impacted into place with care taken to maintain the appropriate version. After inserting the screw to connect the body with the stem, a repeat trial reduction with the +3 mm insert again demonstrated excellent stability with the findings as described above. Therefore, the shoulder was re-dislocated and the permanent +3 mm insert impacted into place. After verifying its locking mechanism, the shoulder was relocated using two finger pressure and again placed through a range of motion with the findings as described above.  The wound was copiously irrigated with bacitracin saline solution using the jet lavage system before a total of 20 cc of Exparel diluted out to 60 cc with normal saline and 30 cc of 0.5% Sensorcaine with epinephrine was injected into the pericapsular and peri-incisional tissues to help with postoperative analgesia. The subscapularis tendon was reapproximated using #2 FiberWire interrupted sutures. The deltopectoral interval was closed using #0 Vicryl interrupted sutures before the subcutaneous tissues were closed using 2-0 Vicryl interrupted sutures. The skin was closed using staples. Prior to closing the skin, 1 g of transexemic acid in 10 cc of normal saline was injected intra-articularly to help with postoperative bleeding. A sterile occlusive dressing was applied to the wound before the arm was  placed into a shoulder immobilizer with an abduction pillow. A Polar Care system also was applied to the shoulder. The patient was then transferred back to a hospital bed before being awakened, extubated, and returned to the recovery room in satisfactory condition after tolerating the procedure well.

## 2018-03-10 NOTE — Addendum Note (Signed)
Addendum  created 03/10/18 1148 by Jrue Yambao, Catheryn BaconJosephine H, CRNA   Intraprocedure Staff edited

## 2018-03-10 NOTE — Plan of Care (Signed)
  Problem: Education: Goal: Knowledge of General Education information will improve Outcome: Progressing   Problem: Health Behavior/Discharge Planning: Goal: Ability to manage health-related needs will improve Outcome: Progressing   Problem: Clinical Measurements: Goal: Ability to maintain clinical measurements within normal limits will improve Outcome: Progressing Goal: Will remain free from infection Outcome: Progressing Goal: Diagnostic test results will improve Outcome: Progressing Goal: Respiratory complications will improve Outcome: Progressing Goal: Cardiovascular complication will be avoided Outcome: Progressing   Problem: Activity: Goal: Risk for activity intolerance will decrease Outcome: Progressing   Problem: Nutrition: Goal: Adequate nutrition will be maintained Outcome: Progressing   Problem: Coping: Goal: Level of anxiety will decrease Outcome: Progressing   Problem: Elimination: Goal: Will not experience complications related to bowel motility Outcome: Progressing Goal: Will not experience complications related to urinary retention Outcome: Progressing   Problem: Pain Managment: Goal: General experience of comfort will improve Outcome: Progressing   Problem: Safety: Goal: Ability to remain free from injury will improve Outcome: Progressing   Problem: Skin Integrity: Goal: Risk for impaired skin integrity will decrease Outcome: Progressing   Problem: Clinical Measurements: Goal: Postoperative complications will be avoided or minimized Outcome: Progressing   Problem: Skin Integrity: Goal: Demonstration of wound healing without infection will improve Outcome: Progressing   

## 2018-03-10 NOTE — Anesthesia Post-op Follow-up Note (Signed)
Anesthesia QCDR form completed.        

## 2018-03-10 NOTE — Evaluation (Signed)
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 Pt seen by PT and evaled POD #0 on 03/10/18. PMHx includes: L total shoulder arthroplasty, HTN    Clinical Impression  Pt demonstrated transfers and ambulation 200 ft (CGA); vc's to slow down and be careful of RUE for safety; Pt demonstrates impulsivity. Pt reports PLOF is independent with all ADLs, works full time, and drives.  Pt reports she has friends/family staying with her for 1 week post discharge from acute hospitalization. Pt would benefit from skilled PT to progress R shoulder strength, ROM, activity tolerance, and optimal return to PLOF per surgeon's protocol.   Follow Up Recommendations Follow surgeon's recommendation for DC plan and follow-up therapies    Equipment Recommendations  3in1 (PT)    Recommendations for Other Services       Precautions / Restrictions Precautions Precautions: Shoulder Type of Shoulder Precautions: R reverse shoulder total arthroplasty Shoulder Interventions: Shoulder sling/immobilizer;Shoulder abduction pillow;At all times Precaution Booklet Issued: No Restrictions Weight Bearing Restrictions: Yes RUE Weight Bearing: Non weight bearing Other Position/Activity Restrictions: R shoulder      Mobility  Bed Mobility               General bed mobility comments: Deferred; Pt sitting at EOB upon PT arrival  Transfers     Transfers: Sit to/from Stand Sit to Stand: Min guard         General transfer comment: Pt demonstrates impulsivity; CGA for safety  Ambulation/Gait Ambulation/Gait assistance: Min guard Ambulation Distance (Feet): 200 Feet       Gait velocity interpretation: at  or above normal speed for age/gender General Gait Details:  Pt demonstrates impulsivity; CGA for safety; vc's to be careful of RUE to not bump into wall or doorway, and maintain NWB status  Stairs            Wheelchair Mobility    Modified Rankin (Stroke Patients Only)       Balance   Sitting-balance support: No upper extremity supported;Feet supported Sitting balance-Leahy Scale: Normal Sitting balance - Comments: good stability sitting EOB     Standing balance-Leahy Scale: Good Standing balance comment: good stability when standing during toileting                             Pertinent Vitals/Pain Pain Assessment: No/denies pain Pain Score: 0-No pain Pain Location: R shoulder Pain Intervention(s): Limited activity within patient's tolerance;Monitored during session;Repositioned    Home Living Family/patient expects to be discharged to:: Private residence Living Arrangements: Alone Available Help at Discharge: Family;Available PRN/intermittently Type of Home: House Home Access: Stairs to enter Entrance Stairs-Rails: Can reach both;Left;Right Entrance Stairs-Number of Steps: 2 Home Layout: One level Home Equipment: Walker - 2 wheels;Grab bars - tub/shower;Cane - single point;Wheelchair - manual      Prior Function Level of Independence: Independent         Comments: Pt reports PLOF as independent w/ all ADLs, works full time, and drives     Hand Dominance   Dominant Hand: Right    Extremity/Trunk Assessment   Upper Extremity Assessment Upper Extremity Assessment: RUE deficits/detail;LUE deficits/detail RUE Deficits / Details: Deferred POD#0/ R reverse shoulder total arthroplasty LUE  Deficits / Details: Pt AROM WNL and strength at least 3/5 shoulder flexion, elbow flexion/extension, pronation/supination    Lower Extremity Assessment Lower Extremity Assessment: Overall WFL for tasks assessed;RLE deficits/detail;LLE deficits/detail RLE  Deficits / Details: Pt AROM WFL and strength at least 3/5 hip flexion, knee flexion/extension LLE Deficits / Details: Pt AROM WFL and strength at least 3/5 hip flexion, knee flexion/extension    Cervical / Trunk Assessment Cervical / Trunk Assessment: Normal  Communication   Communication: No difficulties  Cognition Arousal/Alertness: Awake/alert Behavior During Therapy: WFL for tasks assessed/performed Overall Cognitive Status: Within Functional Limits for tasks assessed                                 General Comments: Pt A&O x4      General Comments      Exercises  Pt agreeable to session.   Assessment/Plan    PT Assessment Patient needs continued PT services  PT Problem List Decreased strength;Decreased range of motion;Decreased activity tolerance;Decreased balance;Decreased mobility;Decreased coordination;Decreased safety awareness;Decreased knowledge of precautions;Pain       PT Treatment Interventions Gait training;Stair training;Functional mobility training;Therapeutic activities;Therapeutic exercise;Patient/family education    PT Goals (Current goals can be found in the Care Plan section)  Acute Rehab PT Goals Patient Stated Goal: to go home PT Goal Formulation: With patient Time For Goal Achievement: 03/24/18 Potential to Achieve Goals: Good    Frequency BID   Barriers to discharge        Co-evaluation               AM-PAC PT "6 Clicks" Daily Activity  Outcome Measure Difficulty turning over in bed (including adjusting bedclothes, sheets and blankets)?: A Lot Difficulty moving from lying on back to sitting on the side of the bed? : A Lot Difficulty sitting down on and standing up from a chair with arms (e.g., wheelchair, bedside commode, etc,.)?: Unable Help needed moving to and from a bed to chair (including a wheelchair)?: A Little Help needed walking in hospital room?: A Little Help needed climbing 3-5 steps with a railing? : A  Lot 6 Click Score: 15    End of Session Equipment Utilized During Treatment: Gait belt Activity Tolerance: Patient tolerated treatment well Patient left: in chair;with call bell/phone within reach;with chair alarm set;with family/visitor present;with SCD's reapplied(B heels elevated by pillow. Polar care applied and activated) Nurse Communication: Mobility status PT Visit Diagnosis: Other abnormalities of gait and mobility (R26.89);Muscle weakness (generalized) (M62.81);Difficulty in walking, not elsewhere classified (R26.2);Pain Pain - Right/Left: Right Pain - part of body: Shoulder    Time: [id number] PT Time Calculation (min) (ACUTE ONLY): 30 min   Charges:         PT G Codes:        Rosealynn Mateus Mondrian-Pardue, SPT 03/10/2018, 3:42 PM

## 2018-03-10 NOTE — Transfer of Care (Signed)
Immediate Anesthesia Transfer of Care Note  Patient: Paige Higgins  Procedure(s) Performed: REVERSE SHOULDER ARTHROPLASTY (Right Shoulder)  Patient Location: PACU  Anesthesia Type:General  Level of Consciousness: drowsy and patient cooperative  Airway & Oxygen Therapy: Patient Spontanous Breathing and Patient connected to face mask oxygen  Post-op Assessment: Report given to RN, Post -op Vital signs reviewed and stable and Patient moving all extremities  Post vital signs: Reviewed and stable  Last Vitals:  Vitals Value Taken Time  BP 117/62 03/10/2018 10:12 AM  Temp    Pulse 86 03/10/2018 10:13 AM  Resp 23 03/10/2018 10:13 AM  SpO2 100 % 03/10/2018 10:13 AM  Vitals shown include unvalidated device data.  Last Pain:  Vitals:   03/10/18 0726  TempSrc:   PainSc: 0-No pain         Complications: No apparent anesthesia complications

## 2018-03-10 NOTE — Anesthesia Preprocedure Evaluation (Signed)
Anesthesia Evaluation  Patient identified by MRN, date of birth, ID band Patient awake    Reviewed: Allergy & Precautions, NPO status , Patient's Chart, lab work & pertinent test results  History of Anesthesia Complications (+) PONV and history of anesthetic complications  Airway Mallampati: II       Dental   Pulmonary asthma , COPD,  COPD inhaler, former smoker,           Cardiovascular hypertension, Pt. on medications (-) CAD and (-) CHF (-) dysrhythmias (-) Valvular Problems/Murmurs     Neuro/Psych neg Seizures Depression    GI/Hepatic Neg liver ROS, neg GERD  ,  Endo/Other  neg diabetes  Renal/GU Renal InsufficiencyRenal disease     Musculoskeletal   Abdominal   Peds  Hematology   Anesthesia Other Findings   Reproductive/Obstetrics                             Anesthesia Physical Anesthesia Plan  ASA: III  Anesthesia Plan: General   Post-op Pain Management: GA combined w/ Regional for post-op pain   Induction: Intravenous  PONV Risk Score and Plan: 4 or greater and Dexamethasone, Ondansetron, Midazolam and Treatment may vary due to age or medical condition  Airway Management Planned: Oral ETT  Additional Equipment:   Intra-op Plan:   Post-operative Plan:   Informed Consent: I have reviewed the patients History and Physical, chart, labs and discussed the procedure including the risks, benefits and alternatives for the proposed anesthesia with the patient or authorized representative who has indicated his/her understanding and acceptance.     Plan Discussed with:   Anesthesia Plan Comments:         Anesthesia Quick Evaluation

## 2018-03-10 NOTE — Anesthesia Procedure Notes (Signed)
Anesthesia Regional Block: Interscalene brachial plexus block   Pre-Anesthetic Checklist: ,, timeout performed, Correct Patient, Correct Site, Correct Laterality, Correct Procedure, Correct Position, site marked, Risks and benefits discussed,  Surgical consent,  Pre-op evaluation,  At surgeon's request and post-op pain management   Prep: chloraprep       Needles:  Injection technique: Single-shot  Needle Type: Echogenic Stimulator Needle     Needle Length: 5cm  Needle Gauge: 21     Additional Needles:   Procedures:, nerve stimulator,,, ultrasound used (permanent image in chart),,,,   Nerve Stimulator or Paresthesia:  Response: biceps flexion,   Additional Responses:   Narrative:  Start time: 03/10/2018 7:24 AM End time: 03/10/2018 7:29 AM Injection made incrementally with aspirations every 5 mL.  Performed by: Personally   Additional Notes: Functioning IV was confirmed and monitors were applied.  A 50mm 22ga Stimuplex needle was used. Sterile prep and drape,hand hygiene and sterile gloves were used.  Negative aspiration and negative test dose prior to incremental administration of local anesthetic. The patient tolerated the procedure well.

## 2018-03-10 NOTE — [place] FL2 (Signed)
  [place] MEDICA[place] FL2 LEVEL OF CARE SCREENING TOOL     IDENTIFICATION  Patient Name: Katina DungLinda Barbour Menz Birthdate: [birth year] Sex: female Admission Date (Current Location): 03/10/2018  [place]   Facility and Address:  [facility], [address], [place], [place] [address]      Provider Number: [id number]  Attending Physician Name and Address:  Christena FlakePoggi, John J, MD  Relative Name and Phone Number:       Current Level of Care: Hospital Recommended Level of Care: Skilled Nursing Facility Prior Approval Number:    Date Approved/Denied:   PASRR Number: ([id number])  Discharge Plan: SNF    Current Diagnoses: Patient Active Problem List   Diagnosis Date Noted  . Status post reverse total shoulder replacement, right 03/10/2018  . Synovial cyst of lumbar facet joint 04/28/2014  . Primary localized osteoarthrosis, shoulder region 07/09/2012    Orientation RESPIRATION BLADDER Height & Weight     Self, Time, Situation, Place  Normal Continent Weight: 150 lb (68 kg) Height:  5\' 1"  (154.9 cm)  BEHAVIORAL SYMPTOMS/MOOD NEUROLOGICAL BOWEL NUTRITION STATUS      Continent Diet  AMBULATORY STATUS COMMUNICATION OF NEEDS Skin   Extensive Assist Verbally Surgical wounds(Incision Right Shoulder)                       Personal Care Assistance Level of Assistance  Bathing, Feeding, Dressing Bathing Assistance: Limited assistance Feeding assistance: Independent Dressing Assistance: Limited assistance     Functional Limitations Info  Sight, Hearing, Speech Sight Info: Adequate Hearing Info: Adequate Speech Info: Adequate    SPECIAL CARE FACTORS FREQUENCY  PT (By licensed PT), OT (By licensed OT)     PT Frequency: (5) OT Frequency: (5)            Contractures      Additional Factors Info  Code Status, Allergies Code Status Info: (Full Code) Allergies Info: (No Known Allergies)           Current  Medications (03/10/2018):  This is the current hospital active medication list Current Facility-Administered Medications  Medication Dose Route Frequency Provider Last Rate Last Dose  . EPINEPHrine (ADRENALIN) 1 MG/ML           . fentaNYL (SUBLIMAZE) injection 25 mcg  25 mcg Intravenous Q5 min PRN Naomie DeanKephart, William K, MD      . lactated ringers infusion   Intravenous Continuous Naomie DeanKephart, William K, MD 75 mL/hr at 03/10/18 [phone]    . scopolamine (TRANSDERM-SCOP) 1 MG/3DAYS 1.5 mg  1 patch Transdermal Once Naomie DeanKephart, William K, MD   1.5 mg at 03/10/18 [phone]     Discharge Medications: Please see discharge summary for a list of discharge medications.  Relevant Imaging Results:  Relevant Lab Results:   Additional Information (SSN: [id number])  Payton SparkAnanda A Raul Winterhalter, Student-Social Work

## 2018-03-10 NOTE — Anesthesia Postprocedure Evaluation (Signed)
Anesthesia Post Note  Patient: Paige Higgins  Procedure(s) Performed: REVERSE SHOULDER ARTHROPLASTY (Right Shoulder)  Patient location during evaluation: PACU Anesthesia Type: General Level of consciousness: awake and alert Pain management: pain level controlled Vital Signs Assessment: post-procedure vital signs reviewed and stable Respiratory status: spontaneous breathing and respiratory function stable Cardiovascular status: stable Anesthetic complications: no     Last Vitals:  Vitals:   03/10/18 1012 03/10/18 1027  BP: 117/62 116/65  Pulse: 88 92  Resp: 16 19  Temp: 36.5 C   SpO2: 100% 98%    Last Pain:  Vitals:   03/10/18 1027  TempSrc:   PainSc: 0-No pain                 [place] K

## 2018-03-11 LAB — BASIC METABOLIC PANEL
ANION GAP: 9 (ref 5–15)
BUN: 16 mg/dL (ref 6–20)
CALCIUM: 8.5 mg/dL — AB (ref 8.9–10.3)
CO2: 26 mmol/L (ref 22–32)
Chloride: 103 mmol/L (ref 101–111)
Creatinine, Ser: 1.03 mg/dL — ABNORMAL HIGH (ref 0.44–1.00)
GFR calc Af Amer: >60 mL/min (ref >60)
GFR calc non Af Amer: 53 mL/min — ABNORMAL LOW (ref >60)
GLUCOSE: 99 mg/dL (ref 65–99)
POTASSIUM: 3.7 mmol/L (ref 3.5–5.1)
Sodium: 138 mmol/L (ref 135–145)

## 2018-03-11 LAB — CBC WITH DIFFERENTIAL/PLATELET
BASOS ABS: 0 10*3/uL (ref 0–0.1)
Basophils Relative: 0 %
Eosinophils Absolute: 0 10*3/uL (ref 0–0.7)
Eosinophils Relative: 0 %
HCT: 37.4 % (ref 35.0–47.0)
Hemoglobin: 12.1 g/dL (ref 12.0–16.0)
LYMPHS PCT: 10 %
Lymphs Abs: 1.4 10*3/uL (ref 1.0–3.6)
MCH: 28.6 pg (ref 26.0–34.0)
MCHC: 32.4 g/dL (ref 32.0–36.0)
MCV: 88.2 fL (ref 80.0–100.0)
MONO ABS: 1 10*3/uL — AB (ref 0.2–0.9)
Monocytes Relative: 7 %
NEUTROS ABS: 11.9 10*3/uL — AB (ref 1.4–6.5)
Neutrophils Relative %: 83 %
Platelets: 316 10*3/uL (ref 150–440)
RBC: 4.24 MIL/uL (ref 3.80–5.20)
RDW: 14 % (ref 11.5–14.5)
WBC: 14.4 10*3/uL — ABNORMAL HIGH (ref 3.6–11.0)

## 2018-03-11 MED ORDER — OXYCODONE HCL 5 MG PO TABS: 5.0000 mg | ORAL_TABLET | ORAL | 0 refills | Status: DC | PRN

## 2018-03-11 NOTE — Progress Notes (Signed)
  Subjective: 1 Day Post-Op Procedure(s) (LRB): REVERSE SHOULDER ARTHROPLASTY (Right) Patient reports pain as mild.   Patient is well, and has had no acute complaints or problems Plan is to go Home after hospital stay. Negative for chest pain and shortness of breath Fever: no Gastrointestinal:Negative for nausea and vomiting Pt is urinating and passing gas this morning.  Objective: Vital signs in last 24 hours: Temp:  [97.5 F (36.4 C)-98.8 F (37.1 C)] 98 F (36.7 C) (04/03 0457) Pulse Rate:  [69-92] 77 (04/03 0457) Resp:  [15-19] 15 (04/03 0457) BP: (106-128)/(61-79) 118/64 (04/03 0457) SpO2:  [90 %-100 %] 99 % (04/03 0457)  Intake/Output from previous day:  Intake/Output Summary (Last 24 hours) at 03/11/2018 0757 Last data filed at 03/10/2018 1912 Gross per 24 hour  Intake 1642.5 ml  Output 300 ml  Net 1342.5 ml    Intake/Output this shift: No intake/output data recorded.  Labs: Recent Labs    03/11/18 0536  HGB 12.1   Recent Labs    03/11/18 0536  WBC 14.4*  RBC 4.24  HCT 37.4  PLT 316   Recent Labs    03/11/18 0536  NA 138  K 3.7  CL 103  CO2 26  BUN 16  CREATININE 1.03*  GLUCOSE 99  CALCIUM 8.5*   No results for input(s): LABPT, INR in the last 72 hours.   EXAM General - Patient is Alert, Appropriate and Oriented Extremity - ABD soft Incision: dressing C/D/I No cellulitis present patient is intact to light touch to the right upper extremity Dressing/Incision - clean, dry, no drainage Motor Function - intact, moving foot and toes well on exam.  Abdomen soft with normal bowel sounds.  Past Medical History:  Diagnosis Date  . Arthritis   . Chronic kidney disease    hx. UTI  . Depression   . Headache(784.0)   . Hyperlipidemia   . Hypertension   . PONV (postoperative nausea and vomiting)     Assessment/Plan: 1 Day Post-Op Procedure(s) (LRB): REVERSE SHOULDER ARTHROPLASTY (Right) Active Problems:   Status post reverse total  shoulder replacement, right  Estimated body mass index is 28.34 kg/m as calculated from the following:   Height as of this encounter: 5\' 1"  (1.549 m).   Weight as of this encounter: 68 kg (150 lb). Advance diet Up with therapy D/C IV fluids when tolerating po intake.  Labs reviewed this AM. Walked 200 feet with PT yesterday. Plan will be for discharge home following morning session of PT.  DVT Prophylaxis - Lovenox, Foot Pumps and TED hose Non-weightbearing to the right arm.  Valeria BatmanJ. Lance Aleeyah Bensen, PA-C [facility] 03/11/2018, 7:57 AM

## 2018-03-11 NOTE — Discharge Instructions (Signed)
Diet: As you were doing prior to hospitalization   Shower:  May shower but keep the wounds dry, use an occlusive plastic wrap, NO SOAKING IN TUB.  If the bandage gets wet, change with a clean dry gauze.  Dressing:  You may change your dressing as needed. Change the dressing with sterile gauze dressing.    Activity:  Increase activity slowly as tolerated, but follow the weight bearing instructions below.  No lifting or driving for 6 weeks.  Weight Bearing:   Non-weightbearing to the right arm.  Blood Clot Prevention: Take 1 325mg  aspirin daily or take 4 81mg  aspirin daily.  To prevent constipation: you may use a stool softener such as -  Colace (over the counter) 100 mg by mouth twice a day  Drink plenty of fluids (prune juice may be helpful) and high fiber foods Miralax (over the counter) for constipation as needed.    Itching:  If you experience itching with your medications, try taking only a single pain pill, or even half a pain pill at a time.  You may take up to 10 pain pills per day, and you can also use benadryl over the counter for itching or also to help with sleep.   Precautions:  If you experience chest pain or shortness of breath - call 911 immediately for transfer to the hospital emergency department!!  If you develop a fever greater that 101 F, purulent drainage from wound, increased redness or drainage from wound, or calf pain-Call [facility]                                              Follow- Up Appointment:  Please call for an appointment to be [facility]

## 2018-03-11 NOTE — Progress Notes (Signed)
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 Pt seen by PT and evaled POD #0 on 03/10/18. PMHx includes: L total shoulder arthroplasty, HTN    PT Comments    Pt demonstrated bed mobility and transfers (independently), ambulation 200 ft and stair climbing (CGA for safety) (see mobility section for details). Pt tolerated session well. Planned discharge today.  Follow Up Recommendations  Follow surgeon's recommendation for DC plan and follow-up therapies     Equipment Recommendations  3in1 (PT)    Recommendations for Other Services       Precautions / Restrictions Precautions Precautions: Shoulder Type of Shoulder Precautions: R reverse shoulder total arthroplasty Shoulder Interventions: Shoulder sling/immobilizer;Shoulder abduction pillow;At all times Precaution Booklet Issued: No Restrictions Weight Bearing Restrictions: Yes RUE Weight Bearing: Non weight bearing Other Position/Activity Restrictions: R shoulder    Mobility  Bed Mobility Overal bed mobility: Independent Bed Mobility: Supine to Sit;Sit to Supine;Sidelying to Sit;Sit to Sidelying   Sidelying to sit: Independent Supine to sit: Independent Sit to supine: Independent Sit to sidelying: Independent General bed mobility comments: Pt demonstrated bed mobility w/ good control while maintaining NWB on RUE  Transfers Overall transfer level: Independent   Transfers: Sit to/from Stand Sit to Stand: Independent         General transfer comment: Pt demonstrates transfers w/ good stability   Ambulation/Gait Ambulation/Gait assistance: Min guard Ambulation Distance (Feet): 200 Feet   Gait Pattern/deviations:  WFL(Within Functional Limits);Step-through pattern   Gait velocity interpretation: at or above normal speed for age/gender General Gait Details: Pt demonstrates good stability when ambulating around nurses station   Stairs Stairs: Yes   Stair Management: Alternating pattern(L sided rail used ascending/decending) Number of Stairs: 8(4,4) General stair comments: Pt demonstrated stair climbing using L arm for stability w/ use of rail; Pt reports rails on both sides of stairs to enter/exit home.  Wheelchair Mobility    Modified Rankin (Stroke Patients Only)       Balance Overall balance assessment: Independent Sitting-balance support: No upper extremity supported;Feet supported Sitting balance-Leahy Scale: Normal Sitting balance - Comments: normal stability sitting EOB     Standing balance-Leahy Scale: Normal Standing balance comment: normal stability standing EOB; prior to standing                            Cognition Arousal/Alertness: Awake/alert Behavior During Therapy: WFL for tasks assessed/performed Overall Cognitive Status: Within Functional Limits for tasks assessed                                 General Comments: Pt A&O x4      Exercises      General Comments  Pt agreeable to session      Pertinent Vitals/Pain Pain Assessment: 0-10 Pain Score: 1  Pain Location: R shoulder Pain Descriptors / Indicators: Sore;Operative site guarding Pain Intervention(s): Limited activity within patient's tolerance;Monitored during session    Home Living                      Prior Function            PT Goals (  current goals can now be found in the care plan section) Acute Rehab PT Goals Patient Stated Goal: to go home PT Goal Formulation: With patient Time For Goal Achievement: 03/24/18 Potential to Achieve Goals: Good Progress towards PT goals: Progressing toward goals    Frequency    BID      PT Plan Current plan  remains appropriate    Co-evaluation              AM-PAC PT "6 Clicks" Daily Activity  Outcome Measure  Difficulty turning over in bed (including adjusting bedclothes, sheets and blankets)?: None Difficulty moving from lying on back to sitting on the side of the bed? : None Difficulty sitting down on and standing up from a chair with arms (e.g., wheelchair, bedside commode, etc,.)?: None Help needed moving to and from a bed to chair (including a wheelchair)?: A Little Help needed walking in hospital room?: A Little Help needed climbing 3-5 steps with a railing? : A Little 6 Click Score: 21    End of Session Equipment Utilized During Treatment: Gait belt Activity Tolerance: Patient tolerated treatment well Patient left: in chair;with call bell/phone within reach;with chair alarm set;with family/visitor present;with SCD's reapplied Nurse Communication: Mobility status PT Visit Diagnosis: Other abnormalities of gait and mobility (R26.89);Muscle weakness (generalized) (M62.81);Difficulty in walking, not elsewhere classified (R26.2);Pain Pain - Right/Left: Right Pain - part of body: Shoulder     Time: 1610-9604 PT Time Calculation (min) (ACUTE ONLY): 22 min  Charges:                       G Codes:        Paige Higgins, SPT 03/11/2018, 10:51 AM

## 2018-03-11 NOTE — Care Management Note (Signed)
Case Management Note  Patient Details  Name: Paige Higgins MRN: [id number] Date of Birth: September 09, 1946  Subjective/Objective:  Discharging today                  Action/Plan: [facility] notified of discharge.   Expected Discharge Date:  03/11/18               Expected Discharge Plan:  Home w Home Health Services  In-House Referral:     Discharge planning Services  CM Consult  Post Acute Care Choice:  Home Health Choice offered to:  Patient  DME Arranged:    DME Agency:     HH Arranged:  OT, PT HH Agency:  [facility] ([facility]  Status of Service:  In process, will continue to follow  If discussed at Long Length of Stay Meetings, dates discussed:    Additional Comments:  Paige MemosLisa M Jaquala Fuller, RN 03/11/2018, 8:58 AM

## 2018-03-11 NOTE — Evaluation (Signed)
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 PMHx includes: L total shoulder arthroplasty, HTN   Clinical Impression   Pt. presents with RUE immobilization which limits the ability to complete basic ADL and IADL functioning. Pt. resides at home alone. Pt. was independent with ADLs, and IADL functioning: including meal preparation, and medication management. Pt. was able to drive and was working. Pt. Has support family close by. Pt. education was provided about UE dressing using one armed dressing techniques, and brace management. Pt. was independently able to donn LE clothing. Independent with transfers, and maneuvering around the room. Pt. Could benefit from OT services at this level of care for ADL training, A/E training, and pt. education about home modification, and DME. Pt. plans to return home upon discharge with family assist as needed.     Follow Up Recommendations  No OT follow up    Equipment Recommendations       Recommendations for Other Services       Precautions / Restrictions Precautions Precautions: Shoulder Type of Shoulder Precautions: R reverse shoulder total arthroplasty Shoulder Interventions: Shoulder sling/immobilizer;Shoulder abduction pillow;At all times Precaution Booklet Issued: No Restrictions Weight Bearing Restrictions: Yes RUE Weight Bearing: Non weight bearing Other Position/Activity Restrictions: Right shoulder      Mobility Bed Mobility Overal bed mobility: Independent Bed Mobility: Supine to Sit;Sit to Supine;Sidelying to Sit;Sit to Sidelying   Sidelying to sit: Independent Supine to sit: Independent Sit to supine: Independent Sit  to sidelying: Independent General bed mobility comments: Pt demonstrated bed mobility w/ good control while maintaining NWB on RUE  Transfers Overall transfer level: Independent   Transfers: Sit to/from Stand Sit to Stand: Independent         General transfer comment: Pt demonstrates transfers w/ good stability     Balance Overall balance assessment: Independent Sitting-balance support: No upper extremity supported;Feet supported Sitting balance-Leahy Scale: Normal Sitting balance - Comments: normal stability sitting EOB     Standing balance-Leahy Scale: Normal Standing balance comment: normal stability standing EOB; prior to standing                           ADL either performed or assessed with clinical judgement   ADL Overall ADL's : Needs assistance/impaired Eating/Feeding: Independent;Set up   Grooming: Set up;Independent   Upper Body Bathing: Set up;Minimal assistance   Lower Body Bathing: Set up;Minimal assistance   Upper Body Dressing : Set up;Moderate assistance   Lower Body Dressing: Set up;Minimal assistance               Functional mobility during ADLs: Independent General ADL Comments: Pt. education was provided about one armed dressing techniques, and A/E use for LE ADLs.     Vision Patient Visual Report: No change from baseline       Perception     Praxis      Pertinent Vitals/Pain Pain Assessment: 0-10 Pain Score: 3  Pain Location: R shoulder Pain Descriptors / Indicators: Sore;Operative site guarding Pain Intervention(s): Limited activity within patient's tolerance;Monitored during session     Hand Dominance Right   Extremity/Trunk Assessment Upper Extremity Assessment Upper Extremity Assessment: RUE deficits/detail;LUE deficits/detail RUE: Unable to fully assess due to  immobilization LUE Deficits / Details: Limited shoulder ROM          Communication Communication Communication: No difficulties   Cognition  Arousal/Alertness: Awake/alert Behavior During Therapy: WFL for tasks assessed/performed Overall Cognitive Status: Within Functional Limits for tasks assessed                                 General Comments: Pt A&O x4   General Comments       Exercises     Shoulder Instructions      Home Living Family/patient expects to be discharged to:: Private residence Living Arrangements: Alone Available Help at Discharge: Family;Available PRN/intermittently Type of Home: House Home Access: Stairs to enter [employer] of Steps: 2 Entrance Stairs-Rails: Can reach both Home Layout: One level     Bathroom Shower/Tub: Chief Strategy Officer: Standard Bathroom Accessibility: Yes   Home Equipment: Walker - 2 wheels;Grab bars - tub/shower;Cane - single point;Wheelchair - manual          Prior Functioning/Environment Level of Independence: Independent        Comments: Pt reports PLOF as independent w/ all ADLs, works full time, and drives        OT Problem List: Decreased strength;Decreased range of motion;Pain;Impaired UE functional use      OT Treatment/Interventions: Self-care/ADL training;Therapeutic exercise;DME and/or AE instruction;Therapeutic activities;Patient/family education    OT Goals(Current goals can be found in the care plan section) Acute Rehab OT Goals Patient Stated Goal: To return home OT Goal Formulation: With patient Potential to Achieve Goals: Good  OT Frequency: Min 2X/week   Barriers to D/C:            Co-evaluation              AM-PAC PT "6 Clicks" Daily Activity     Outcome Measure Help from another person eating meals?: None Help from another person taking care of personal grooming?: None Help from another person toileting, which includes using toliet, bedpan, or urinal?: A Little Help from another person bathing (including washing, rinsing, drying)?: A Little Help from another person to put on and  taking off regular upper body clothing?: A Lot Help from another person to put on and taking off regular lower body clothing?: A Little 6 Click Score: 19   End of Session Equipment Utilized During Treatment: Gait belt  Activity Tolerance: Patient tolerated treatment well Patient left: in bed;with family/visitor present;with nursing/sitter in room                   Time: 1610-9604 OT Time Calculation (min): 15 min Charges:  OT General Charges $OT Visit: 1 Visit OT Evaluation $OT Eval Low Complexity: 1 Low G-Codes:     Olegario Messier, MS, OTR/L   Olegario Messier, MS, OTR/L 03/11/2018, 12:13 PM

## 2018-03-11 NOTE — Discharge Summary (Signed)
Physician Discharge Summary  Patient ID: Paz Fuentes MRN: [id number] DOB/AGE: 1946/07/24 72 y.o.  Admit date: 03/10/2018 Discharge date: 03/11/2018  Admission Diagnoses:  ROTAOR CUFF ARTHROPATHY, PRIMARY OSTEOARTHRITIS RIGHT Advanced degenerative joint disease of glenohumeral joint with rotator cuff tendinopathy, right shoulder.  Discharge Diagnoses: Patient Active Problem List   Diagnosis Date Noted  . Status post reverse total shoulder replacement, right 03/10/2018  . Synovial cyst of lumbar facet joint 04/28/2014  . 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  Past Medical History:  Diagnosis Date  . Arthritis   . Chronic kidney disease    hx. UTI  . Depression   . Headache(784.0)   . Hyperlipidemia   . Hypertension   . PONV (postoperative nausea and vomiting)      Transfusion: None.   Consultants (if any):   Discharged Condition: Improved  Hospital Course: [employer] Null is an 72 y.o. female who was admitted 03/10/2018 with a diagnosis of advanced degenerative joint disease of the right glenohumeral joint with rotator cuff tendinopathy and went to the operating room on 03/10/2018 and underwent the above named procedures.    Surgeries: Procedure(s): REVERSE SHOULDER ARTHROPLASTY on 03/10/2018 Patient tolerated the surgery well. Taken to PACU where she was stabilized and then transferred to the orthopedic floor.  Started on Lovenox 40mg  q 24 hrs. Foot pumps applied bilaterally at 80 mm. Heels elevated on bed with rolled towels. No evidence of DVT. Negative Homan. Physical therapy started on day #1 for gait training and transfer. OT started day #1 for ADL and assisted devices.  Patient's IV was removed on POD1.  Implants:  All press-fit Integra system with a 12 mm stem, a small metaphyseal body, a +3 mm humeral platform, a mini baseplate,  and a 38 mm concentric +2 mm laterally offset glenosphere.  She was given perioperative antibiotics:  Anti-infectives (From admission, onward)   Start     Dose/Rate Route Frequency Ordered Stop   03/10/18 1400  ceFAZolin (ANCEF) IVPB 2g/100 mL premix     2 g 200 mL/hr over 30 Minutes Intravenous Every 6 hours 03/10/18 1121 03/11/18 0215   03/10/18 0603  ceFAZolin (ANCEF) 2-4 GM/100ML-% IVPB    Note to Pharmacy:  Jonetta Speak   : cabinet override      03/10/18 0603 03/10/18 0752   03/09/18 2230  ceFAZolin (ANCEF) IVPB 2g/100 mL premix     2 g 200 mL/hr over 30 Minutes Intravenous  Once 03/09/18 2218 03/10/18 0807    .  She was given sequential compression devices, early ambulation, and Lovenox for DVT prophylaxis.  She benefited maximally from the hospital stay and there were no complications.    Recent vital signs:  Vitals:   03/11/18 0122 03/11/18 0457  BP: 118/63 118/64  Pulse: 69 77  Resp: 16 15  Temp: 98.5 F (36.9 C) 98 F (36.7 C)  SpO2: 100% 99%    Recent laboratory studies:  Lab Results  Component Value Date   HGB 12.1 03/11/2018   HGB 15.3 02/27/2018   HGB 13.9 04/22/2014   Lab Results  Component Value Date   WBC 14.4 (H) 03/11/2018   PLT 316 03/11/2018   Lab Results  Component Value Date   INR 0.94 02/27/2018   Lab Results  Component Value Date   NA 138 03/11/2018   K 3.7 03/11/2018   CL 103 03/11/2018   CO2 26 03/11/2018  BUN 16 03/11/2018   CREATININE 1.03 (H) 03/11/2018   GLUCOSE 99 03/11/2018    Discharge Medications:   Allergies as of 03/11/2018   No Known Allergies     Medication List    TAKE these medications   albuterol 108 (90 Base) MCG/ACT inhaler Commonly known as:  PROVENTIL HFA;VENTOLIN HFA Inhale 2 puffs into the lungs every 4 (four) hours as needed. For wheezing.   albuterol (2.5 MG/3ML) 0.083% nebulizer solution Commonly known as:  PROVENTIL Take 2.5 mg by nebulization every 6 (six) hours as needed. For shortness  of breath or wheezing.   aspirin EC 81 MG tablet Take 81 mg by mouth daily.   atorvastatin 40 MG tablet Commonly known as:  LIPITOR Take 40 mg by mouth daily.   cetirizine 10 MG tablet Commonly known as:  ZYRTEC Take 10 mg by mouth daily.   diclofenac sodium 1 % Gel Commonly known as:  VOLTAREN Apply 1 application topically 3 (three) times daily as needed (PAIN).   EXCEDRIN EXTRA STRENGTH 250-250-65 MG tablet Generic drug:  aspirin-acetaminophen-caffeine Take 2 tablets by mouth every 6 (six) hours as needed for headache.   multivitamin tablet Take 1 tablet by mouth daily.   oxyCODONE 5 MG immediate release tablet Commonly known as:  Oxy IR/ROXICODONE Take 1-2 tablets (5-10 mg total) by mouth every 4 (four) hours as needed for moderate pain.   SUMAtriptan 50 MG tablet Commonly known as:  IMITREX Take 50 mg by mouth every 2 (two) hours as needed. For migraine.   tretinoin 0.025 % cream Commonly known as:  RETIN-A Apply 1 application topically at bedtime.   triamterene-hydrochlorothiazide 37.5-25 MG tablet Commonly known as:  MAXZIDE-25 Take 1 tablet by mouth daily.       Diagnostic Studies: Dg Chest 2 View  Result Date: 02/27/2018 CLINICAL DATA:  Preop examination EXAM: CHEST - 2 VIEW COMPARISON:  04/22/2014 FINDINGS: Cardiac enlargement without heart failure. Lungs are clear without infiltrate effusion or mass. Apical pleural scarring bilaterally. Left shoulder replacement. IMPRESSION: No active cardiopulmonary disease. Electronically Signed   By: Marlan Palauharles  Clark M.D.   On: 02/27/2018 15:31   Dg Shoulder Right Port  Result Date: 03/10/2018 CLINICAL DATA:  Status post reverse right shoulder joint replacement. EXAM: PORTABLE RIGHT SHOULDER COMPARISON:  None FINDINGS: The patient has undergone reverse right shoulder joint prosthesis placement. The interface with the prosthetic components with the native bone is good. No acute native bone abnormality is observed. There is  skin staples present. IMPRESSION: No immediate postprocedure complication following right reverse shoulder joint prosthesis placement. Electronically Signed   By: David  SwazilandJordan M.D.   On: 03/10/2018 10:41   Disposition: Plan will be for discharge home following morning session of PT.  Follow-up Information    Anson OregonMcGhee, Pilot Prindle Lance, PA-C Follow up in 14 day(s).   Specialty:  Physician Assistant Why:  Mindi SlickerStaple Removal. Contact information: [address] [facility] [place] [address] [phone]          Signed: Meriel PicaJames L Freddye Cardamone PA-C 03/11/2018, 8:01 AM

## 2018-03-11 NOTE — Progress Notes (Signed)
Discharge instructions and medication details reviewed and explained to patient and family. Patient verbalizes understanding. All questions answered. VS stable and IV removed. Patient escorted out via wheelchair. All belongings packed.

## 2018-03-11 NOTE — Progress Notes (Signed)
Clinical Social Worker (CSW) received SNF consult. PT is recommending follow surgeon's recommendation for D/C plan. RN case manager aware of above. Please reconsult if future social work needs arise. CSW signing off.   Jolaine Fryberger, LCSW [phone]  

## 2018-03-12 LAB — SURGICAL PATHOLOGY

## 2018-03-13 ENCOUNTER — Emergency Department: Payer: Medicare Other

## 2018-03-13 ENCOUNTER — Emergency Department
Admission: EM | Admit: 2018-03-13 | Discharge: 2018-03-14 | Disposition: A | Discharge status: Home / Self Care | Payer: Medicare Other | Attending: Emergency Medicine | Admitting: Emergency Medicine

## 2018-03-13 ENCOUNTER — Encounter: Payer: Self-pay | Admitting: *Deleted

## 2018-03-13 ENCOUNTER — Other Ambulatory Visit: Payer: Self-pay

## 2018-03-13 DIAGNOSIS — Z96611 Presence of right artificial shoulder joint: Secondary | ICD-10-CM | POA: Insufficient documentation

## 2018-03-13 DIAGNOSIS — W1842XA Slipping, tripping and stumbling without falling due to stepping into hole or opening, initial encounter: Secondary | ICD-10-CM | POA: Diagnosis not present

## 2018-03-13 DIAGNOSIS — Y9301 Activity, walking, marching and hiking: Secondary | ICD-10-CM | POA: Insufficient documentation

## 2018-03-13 DIAGNOSIS — T148XXA Other injury of unspecified body region, initial encounter: Secondary | ICD-10-CM

## 2018-03-13 DIAGNOSIS — Y929 Unspecified place or not applicable: Secondary | ICD-10-CM | POA: Diagnosis not present

## 2018-03-13 DIAGNOSIS — N189 Chronic kidney disease, unspecified: Secondary | ICD-10-CM | POA: Insufficient documentation

## 2018-03-13 DIAGNOSIS — S8992XA Unspecified injury of left lower leg, initial encounter: Secondary | ICD-10-CM | POA: Diagnosis present

## 2018-03-13 DIAGNOSIS — Z7982 Long term (current) use of aspirin: Secondary | ICD-10-CM | POA: Insufficient documentation

## 2018-03-13 DIAGNOSIS — I129 Hypertensive chronic kidney disease with stage 1 through stage 4 chronic kidney disease, or unspecified chronic kidney disease: Secondary | ICD-10-CM | POA: Diagnosis not present

## 2018-03-13 DIAGNOSIS — S8012XA Contusion of left lower leg, initial encounter: Secondary | ICD-10-CM | POA: Insufficient documentation

## 2018-03-13 DIAGNOSIS — Y999 Unspecified external cause status: Secondary | ICD-10-CM | POA: Insufficient documentation

## 2018-03-13 DIAGNOSIS — Z87891 Personal history of nicotine dependence: Secondary | ICD-10-CM | POA: Insufficient documentation

## 2018-03-13 DIAGNOSIS — Z96612 Presence of left artificial shoulder joint: Secondary | ICD-10-CM | POA: Insufficient documentation

## 2018-03-13 DIAGNOSIS — S8002XA Contusion of left knee, initial encounter: Secondary | ICD-10-CM | POA: Diagnosis not present

## 2018-03-13 DIAGNOSIS — Z96653 Presence of artificial knee joint, bilateral: Secondary | ICD-10-CM | POA: Insufficient documentation

## 2018-03-13 DIAGNOSIS — Z79899 Other long term (current) drug therapy: Secondary | ICD-10-CM | POA: Insufficient documentation

## 2018-03-13 NOTE — ED Triage Notes (Signed)
Pt states on Wed night she stepped in a hole and fell to L knee. Pt sustained abrasion and has contusion to L knee. Pt has hx of total knee replacement of L knee. Pt is ambulatory w/ slight limp. Pt has painful weight bearing.

## 2018-03-13 NOTE — ED Provider Notes (Signed)
[facility] Emergency Department Provider Note  ____________________________________________   First MD Initiated Contact with Patient 03/13/18 2315     (approximate)  I have reviewed the triage vital signs and the nursing notes.   HISTORY  Chief Complaint Knee Injury    HPI Paige Higgins is a 72 y.o. female with medical history as listed below which most notably includes recent right shoulder surgery by Dr. Joice Lofts within the last week and resulting increase daily dose of aspirin.  She presents tonight for evaluation of persistent swelling, bruising, and pain of and just below her left knee after a fall which occurred about 2 days ago.  She reports that she was walking outside 2 nights ago when she stepped in a hole and fell onto her left knee.  She has an abrasion and almost immediate swelling and bruising to the area.  She is concerned because she thought she might have a "blood clot" as a result of the fall because it is still swollen and tender.  She is able to ambulate but with a slight limp.  Walking and moving in general makes the pain worse and rest makes it better.  She describes the pain is moderate.  She has no other injuries, her shoulder feels well in the immediate postoperative period, she has no headache, neck pain, chest pain, nausea, vomiting, nor abdominal pain.  Past Medical History:  Diagnosis Date  . Arthritis   . Chronic kidney disease    hx. UTI  . Depression   . Headache(784.0)   . Hyperlipidemia   . Hypertension   . PONV (postoperative nausea and vomiting)     Patient Active Problem List   Diagnosis Date Noted  . Status post reverse total shoulder replacement, right 03/10/2018  . Synovial cyst of lumbar facet joint 04/28/2014  . Primary localized osteoarthrosis, shoulder region 07/09/2012    Past Surgical History:  Procedure Laterality Date  . ABDOMINAL HYSTERECTOMY    . ARTHROPLASTY  07/07/2012   LEFT SHOULDER  .  BREAST BIOPSY Left 2000  . CARDIAC CATHETERIZATION    . JOINT REPLACEMENT     bilateral knee replacements  . LUMBAR LAMINECTOMY/DECOMPRESSION MICRODISCECTOMY N/A 04/28/2014   Procedure: LUMBAR THREE TO FOUR, LUMBAR FOUR TO FIVE  LAMINECTOMY/DECOMPRESSION MICRODISCECTOMY 2 LEVELS;  Surgeon: Cristi Loron, MD;  Location: [facility];  Service: Neurosurgery;  Laterality: N/A;  L34 L45 laminectomies  . REVERSE SHOULDER ARTHROPLASTY  07/07/2012   Procedure: REVERSE SHOULDER ARTHROPLASTY;  Surgeon: Mable Paris, MD;  Location: [facility];  Service: Orthopedics;  Laterality: Left;  . REVERSE SHOULDER ARTHROPLASTY Right 03/10/2018   Procedure: REVERSE SHOULDER ARTHROPLASTY;  Surgeon: Christena Flake, MD;  Location: [facility] ORS;  Service: Orthopedics;  Laterality: Right;  . SHOULDER ARTHROSCOPY     right shoulder    Prior to Admission medications   Medication Sig Start Date End Date Taking? Authorizing Provider  albuterol (PROVENTIL HFA;VENTOLIN HFA) 108 (90 BASE) MCG/ACT inhaler Inhale 2 puffs into the lungs every 4 (four) hours as needed. For wheezing.    [provider]  albuterol (PROVENTIL) (2.5 MG/3ML) 0.083% nebulizer solution Take 2.5 mg by nebulization every 6 (six) hours as needed. For shortness of breath or wheezing.    [provider]  aspirin EC 81 MG tablet Take 81 mg by mouth daily.    [provider]  aspirin-acetaminophen-caffeine (EXCEDRIN EXTRA STRENGTH) [phone] MG tablet Take 2 tablets by mouth every 6 (six) hours as needed for  headache.    [provider]  atorvastatin (LIPITOR) 40 MG tablet Take 40 mg by mouth daily.    [provider]  cetirizine (ZYRTEC) 10 MG tablet Take 10 mg by mouth daily.    [provider]  diclofenac sodium (VOLTAREN) 1 % GEL Apply 1 application topically 3 (three) times daily as needed (PAIN).    [provider]  Multiple Vitamin (MULTIVITAMIN) tablet Take 1 tablet by mouth daily.     [provider]  oxyCODONE (OXY IR/ROXICODONE) 5 MG immediate release tablet Take 1-2 tablets (5-10 mg total) by mouth every 4 (four) hours as needed for moderate pain. 03/11/18   Anson OregonMcGhee, James Lance, PA-C  SUMAtriptan (IMITREX) 50 MG tablet Take 50 mg by mouth every 2 (two) hours as needed. For migraine.    [provider]  tretinoin (RETIN-A) 0.025 % cream Apply 1 application topically at bedtime.    [provider]  triamterene-hydrochlorothiazide (MAXZIDE-25) 37.5-25 MG per tablet Take 1 tablet by mouth daily.    [provider]    Allergies Patient has no known allergies.  Family History  Problem Relation Age of Onset  . Breast cancer Neg Hx     Social History Social History   Tobacco Use  . Smoking status: Former Smoker    Packs/day: 0.50    Years: 8.00    Pack years: 4.00    Types: Cigarettes    Last attempt to quit: 01/08/1986    Years since quitting: 32.2  . Smokeless tobacco: Never Used  Substance Use Topics  . Alcohol use: No  . Drug use: No    Review of Systems Constitutional: No fever/chills Cardiovascular: Denies chest pain. Respiratory: Denies shortness of breath. Gastrointestinal: No abdominal pain.  No nausea, no vomiting.   Musculoskeletal: Pain in left knee and left lower leg.  Negative for neck pain.  Negative for back pain. Integumentary: Abrasion on upper left shin Neurological: Negative for headaches, focal weakness or numbness.   ____________________________________________   PHYSICAL EXAM:  VITAL SIGNS: ED Triage Vitals  Enc Vitals Group     BP 03/13/18 2242 121/63     Pulse Rate 03/13/18 2242 90     Resp 03/13/18 2242 20     Temp 03/13/18 2242 98.1 F (36.7 C)     Temp Source 03/13/18 2242 Oral     SpO2 03/13/18 2242 99 %     Weight 03/13/18 2247 68 kg (150 lb)     Height 03/13/18 2247 1.524 m (5')     Head Circumference --      Peak Flow --      Pain Score 03/13/18 2308 7     Pain Loc --       Pain Edu? --      Excl. in GC? --     Constitutional: Alert and oriented. Well appearing and in no acute distress. Eyes: Conjunctivae are normal.  Head: Atraumatic. Nose: No congestion/rhinnorhea. Mouth/Throat: Mucous membranes are moist. Cardiovascular: Normal rate, regular rhythm. Good peripheral circulation.  Respiratory: Normal respiratory effort.  No retractions.  Musculoskeletal: Patient has a postoperative sling in place on her right upper extremity.  Left lower extremity is notable for bruising and swelling mostly distal to but also including the left knee.  There is an old postoperative scar from a prior knee replacement.  She has an area of superficial abrasion that is subacute on the proximal anterior tibia with no sign of infection.  There is tenderness to palpation  throughout with some swelling and an obvious hematoma in the area of the tibial plateau.  The patient is ambulating with a slight limp but otherwise able to bear weight without difficulty.  She has normal range of motion of her knee though with some pain/tenderness with flexion and extension. Neurologic:  Normal speech and language. No gross focal neurologic deficits are appreciated.  Skin:  Skin is warm, dry and intact. No rash noted. Psychiatric: Mood and affect are normal. Speech and behavior are normal.  ____________________________________________   LABS (all labs ordered are listed, but only abnormal results are displayed)  Labs Reviewed - No data to display ____________________________________________  EKG  None - EKG not ordered by ED physician ____________________________________________  RADIOLOGY I, Loleta Rose, personally viewed and evaluated these images (plain radiographs) as part of my medical decision making, as well as reviewing the written report by the radiologist.  ED MD interpretation: No evidence of acute fracture or dislocation with well-appearing orthopedic hardware.  Official  radiology report(s): Dg Knee Complete 4 Views Left  Result Date: 03/14/2018 CLINICAL DATA:  Patient stepped into a hole and fell to the left knee Wednesday night. Abrasion and contusion to the left knee. EXAM: LEFT KNEE - COMPLETE 4+ VIEW COMPARISON:  04/07/2014 FINDINGS: Left total knee arthroplasty with patellar femoral component. Components appear well seated. No evidence of acute fracture or dislocation. No significant effusion. Soft tissues are unremarkable. IMPRESSION: Left total knee arthroplasty. Components appear well seated. No acute bony abnormalities. Electronically Signed   By: Burman Nieves M.D.   On: 03/14/2018 00:04    ____________________________________________   PROCEDURES  Critical Care performed: No   Procedure(s) performed:   Procedures   ____________________________________________   INITIAL IMPRESSION / ASSESSMENT AND PLAN / ED COURSE  As part of my medical decision making, I reviewed the following data within the electronic MEDICAL RECORD NUMBER Nursing notes reviewed and incorporated, Labs reviewed  and Radiograph reviewed     The patient came in because she was worried she might have a blood clot in her leg after the fall.  I explained to her the difference between a superficial traumatic hematoma and a DVT, and she felt better after our discussion.  There is no reason to think that she would have developed a spontaneous thromboembolism as a result of her fall.  She has an obvious hematoma from the contusion of her left knee and tibia but no evidence of acute or emergent disruption of the bones, ligaments, or any dislocation.  She is ambulatory and weightbearing with only a slight limp.  Radiographs are reassuring and unremarkable.  I encouraged her to continue treating with over-the-counter medication, cryotherapy, and even some pressure if she can tolerate it around the hematoma (RICE).  I also encouraged her to follow-up with Dr. post she is scheduled for her  shoulder and mentioned this injury to her leg.  She understands and agrees with the plan.     ____________________________________________  FINAL CLINICAL IMPRESSION(S) / ED DIAGNOSES  Final diagnoses:  Contusion of left knee and lower leg, initial encounter  Traumatic hematoma     MEDICATIONS GIVEN DURING THIS VISIT:  Medications - No data to display   ED Discharge Orders    None       Note:  This document was prepared using Dragon voice recognition software and may include unintentional dictation errors.    Loleta Rose, MD 03/14/18 [phone]

## 2018-03-13 NOTE — Discharge Instructions (Signed)
As we discussed, you have no indication of any severe injuries as a result of your fall.  Although you do technically have a blood clot, it is atraumatic blood clot in the tissue of your knee and left lower leg, not within the veins which could lead to complications (called a deep vein thrombosis, or DVT).  We recommend you read through the information included for treatment of contusion with rest when possible, ice, compression, and elevation (RICE).  Your body over time will heal the bruise and hematoma.  Please follow-up with your orthopedic surgeon as planned for your shoulder and you can also discuss your leg injury at that time.  Return to the emergency department if you develop new or worsening symptoms that concern you.

## 2018-03-14 NOTE — ED Notes (Signed)
ED Provider at bedside. 

## 2018-03-14 NOTE — ED Notes (Signed)
This RN reviewed discharge instructions, follow-up care, compression, OTC medication, cryotherapy, and need for elevation with patient. Patient verbalized understanding of all reviewed information.  Patient stable, with no distress noted at this time.

## 2018-05-26 ENCOUNTER — Ambulatory Visit: Payer: Medicare Other | Attending: Surgery | Admitting: Physical Therapy

## 2018-06-03 ENCOUNTER — Ambulatory Visit: Payer: Medicare Other | Admitting: Physical Therapy

## 2018-06-09 ENCOUNTER — Ambulatory Visit: Payer: Medicare Other | Attending: Surgery | Admitting: Physical Therapy

## 2018-06-09 ENCOUNTER — Encounter: Payer: Medicare Other | Admitting: Physical Therapy

## 2018-06-16 ENCOUNTER — Ambulatory Visit: Payer: Medicare Other | Admitting: Physical Therapy

## 2018-06-16 ENCOUNTER — Encounter: Payer: Medicare Other | Admitting: Physical Therapy

## 2018-06-18 ENCOUNTER — Ambulatory Visit: Payer: Medicare Other | Admitting: Physical Therapy

## 2018-06-18 ENCOUNTER — Encounter: Payer: Medicare Other | Admitting: Physical Therapy

## 2018-06-23 ENCOUNTER — Encounter: Payer: Medicare Other | Admitting: Physical Therapy

## 2018-06-30 ENCOUNTER — Encounter: Payer: Medicare Other | Admitting: Physical Therapy

## 2018-07-02 ENCOUNTER — Encounter: Payer: Medicare Other | Admitting: Physical Therapy

## 2018-07-07 ENCOUNTER — Encounter: Payer: Medicare Other | Admitting: Physical Therapy

## 2018-07-09 ENCOUNTER — Encounter: Payer: Medicare Other | Admitting: Physical Therapy

## 2018-07-14 ENCOUNTER — Encounter: Payer: Medicare Other | Admitting: Physical Therapy

## 2018-07-16 ENCOUNTER — Encounter: Payer: Medicare Other | Admitting: Physical Therapy

## 2018-08-17 ENCOUNTER — Other Ambulatory Visit: Payer: Self-pay | Admitting: Nurse Practitioner

## 2018-08-17 DIAGNOSIS — R1084 Generalized abdominal pain: Secondary | ICD-10-CM

## 2018-08-31 ENCOUNTER — Ambulatory Visit
Admission: RE | Admit: 2018-08-31 | Discharge: 2018-08-31 | Disposition: A | Discharge status: Home / Self Care | Payer: Medicare Other | Source: Ambulatory Visit | Attending: Nurse Practitioner | Admitting: Nurse Practitioner

## 2018-08-31 DIAGNOSIS — M47817 Spondylosis without myelopathy or radiculopathy, lumbosacral region: Secondary | ICD-10-CM | POA: Diagnosis not present

## 2018-08-31 DIAGNOSIS — I700 Atherosclerosis of aorta: Secondary | ICD-10-CM | POA: Insufficient documentation

## 2018-08-31 DIAGNOSIS — R1084 Generalized abdominal pain: Secondary | ICD-10-CM | POA: Insufficient documentation

## 2018-08-31 MED ORDER — IOPAMIDOL (ISOVUE-300) INJECTION 61%
100.0000 mL | Freq: Once | INTRAVENOUS | Status: AC | PRN
  Administered 2018-08-31: 100 mL via INTRAVENOUS

## 2018-09-23 ENCOUNTER — Other Ambulatory Visit: Payer: Self-pay | Admitting: Family Medicine

## 2018-09-23 DIAGNOSIS — Z1231 Encounter for screening mammogram for malignant neoplasm of breast: Secondary | ICD-10-CM

## 2018-10-21 ENCOUNTER — Encounter: Admission: RE | Payer: Self-pay | Source: Ambulatory Visit

## 2018-10-21 ENCOUNTER — Ambulatory Visit
Admission: RE | Admit: 2018-10-21 | Payer: Medicare Other | Source: Ambulatory Visit | Admitting: Unknown Physician Specialty

## 2018-10-21 SURGERY — COLONOSCOPY WITH PROPOFOL
Anesthesia: General

## 2019-01-19 ENCOUNTER — Other Ambulatory Visit: Payer: Self-pay | Admitting: Neurosurgery

## 2019-03-23 ENCOUNTER — Other Ambulatory Visit (HOSPITAL_COMMUNITY): Payer: Medicare Other

## 2019-04-09 ENCOUNTER — Ambulatory Visit: Admit: 2019-04-09 | Payer: Medicare Other | Admitting: Unknown Physician Specialty

## 2019-04-09 SURGERY — COLONOSCOPY WITH PROPOFOL
Anesthesia: General

## 2019-06-04 NOTE — Pre-Procedure Instructions (Signed)
[facility] [address], [place] [address] [address] Phone: [phone] Fax: [phone]  [facility] [address], [place] [address] Phone: [phone] Fax: [phone]  [id number] - [place], [place] - [place] [address] [place] [address] [place] [address] Phone: [phone] Fax: [phone]      Your procedure is scheduled on 06-09-19 wednesday  Report to [facility] Main Entrance "A" at 1000 A.M., and check in at the Admitting office.  Call this number if you have problems the morning of surgery:  [phone]  Call [phone] if you have any questions prior to your surgery date Monday-Friday 8am-4pm    Remember:  Do not eat or drink after midnight.     Take these medicines the morning of surgery with A SIP OF WATER : atorvastatin (LIPITOR) ipratropium (ATROVENT)  albuterol (PROVENTIL HFA;VENTOLIN HFA) as needed. Bring your inhaler with you. chlorpheniramine (CHLOR-TRIMETON) as needed fluticasone (FLONASE)as needed SUMAtriptan (IMITREX)as needed Follow your surgeon's instructions on when to stop Aspirin.  If no instructions were given by your surgeon then you will need to call the office to get those instructions.    7 days prior to surgery STOP taking any Aspirin (unless otherwise instructed by your surgeon), Aleve, Naproxen, Ibuprofen, Motrin, Advil, Goody's, BC's, all herbal medications, fish oil, and all vitamins.   The Morning of Surgery  Do not wear jewelry, make-up or nail polish.  Do not wear lotions, powders, or perfumes/colognes, or deodorant  Do not shave 48 hours prior to surgery.  Men may shave face and neck.  Do not bring valuables to the hospital.  [facility] is not responsible for any belongings or valuables.  If you are a  smoker, DO NOT Smoke 24 hours prior to surgery IF you wear a CPAP at night please bring your mask, tubing, and machine the morning of surgery   Remember that you must have someone to transport you home after your surgery, and remain with you for 24 hours if you are discharged the same day.   Contacts, glasses, hearing aids, dentures or bridgework may not be worn into surgery.    Leave your suitcase in the car.  After surgery it may be brought to your room.  For patients admitted to the hospital, discharge time will be determined by your treatment team.  Patients discharged the day of surgery will not be allowed to drive home.    Special instructions:   [place]- Preparing For Surgery  Before surgery, you can play an important role. Because skin is not sterile, your skin needs to be as free of germs as possible. You can reduce the number of germs on your skin by washing with CHG (chlorahexidine gluconate) Soap before surgery.  CHG is an antiseptic cleaner which kills germs and bonds with the skin to continue killing germs even after washing.    Oral Hygiene is also important to reduce your risk of infection.  Remember - BRUSH YOUR TEETH THE MORNING OF SURGERY WITH YOUR REGULAR TOOTHPASTE  Please do not use if you have an allergy to CHG or antibacterial soaps. If your skin becomes reddened/irritated stop using the CHG.  Do not shave (including legs and underarms) for at least 48 hours prior to first CHG shower. It is OK to shave your face.  Please follow these instructions carefully.   1. Shower the NIGHT BEFORE SURGERY and the MORNING OF SURGERY with CHG Soap.   2. If you chose to wash your hair, wash your hair first as usual with your normal shampoo.  3. After you shampoo, rinse your hair and body thoroughly to remove the shampoo.  4. Use CHG as you would any other liquid soap. You can apply CHG directly to the skin and wash gently with a scrungie or a clean washcloth.    5. Apply the CHG Soap to your body ONLY FROM THE NECK DOWN.  Do not use on open wounds or open sores. Avoid contact with your eyes, ears, mouth and genitals (private parts). Wash Face and genitals (private parts)  with your normal soap.   6. Wash thoroughly, paying special attention to the area where your surgery will be performed.  7. Thoroughly rinse your body with warm water from the neck down.  8. DO NOT shower/wash with your normal soap after using and rinsing off the CHG Soap.  9. Pat yourself dry with a CLEAN TOWEL.  10. Wear CLEAN PAJAMAS to bed the night before surgery, wear comfortable clothes the morning of surgery  11. Place CLEAN SHEETS on your bed the night of your first shower and DO NOT SLEEP WITH PETS.    Day of Surgery:  Do not apply any deodorants/lotions.  Please wear clean clothes to the hospital/surgery center.   Remember to brush your teeth WITH YOUR REGULAR TOOTHPASTE.   Please read over the following fact sheets that you were given.

## 2019-06-07 ENCOUNTER — Other Ambulatory Visit
Admission: RE | Admit: 2019-06-07 | Discharge: 2019-06-07 | Disposition: A | Discharge status: Home / Self Care | Payer: Medicare Other | Source: Ambulatory Visit | Attending: Neurosurgery | Admitting: Neurosurgery

## 2019-06-07 ENCOUNTER — Encounter (HOSPITAL_COMMUNITY)
Admission: RE | Admit: 2019-06-07 | Discharge: 2019-06-07 | Disposition: A | Discharge status: Home / Self Care | Payer: Medicare Other | Source: Ambulatory Visit | Attending: Neurosurgery | Admitting: Neurosurgery

## 2019-06-07 ENCOUNTER — Other Ambulatory Visit: Payer: Self-pay

## 2019-06-07 ENCOUNTER — Encounter (HOSPITAL_COMMUNITY): Payer: Self-pay

## 2019-06-07 ENCOUNTER — Other Ambulatory Visit (HOSPITAL_COMMUNITY): Payer: Medicare Other

## 2019-06-07 DIAGNOSIS — Z1159 Encounter for screening for other viral diseases: Secondary | ICD-10-CM | POA: Diagnosis not present

## 2019-06-07 DIAGNOSIS — Z01812 Encounter for preprocedural laboratory examination: Secondary | ICD-10-CM | POA: Insufficient documentation

## 2019-06-07 HISTORY — DX: Allergy, unspecified, initial encounter: T78.40XA

## 2019-06-07 HISTORY — DX: Urinary tract infection, site not specified: N39.0

## 2019-06-07 HISTORY — DX: Spondylolisthesis, lumbar region: M43.16

## 2019-06-07 HISTORY — DX: Cardiac murmur, unspecified: R01.1

## 2019-06-07 HISTORY — DX: Unspecified asthma, uncomplicated: J45.909

## 2019-06-07 HISTORY — DX: Presence of spectacles and contact lenses: Z97.3

## 2019-06-07 LAB — SURGICAL PCR SCREEN
MRSA, PCR: NEGATIVE
Staphylococcus aureus: NEGATIVE

## 2019-06-07 LAB — CBC
HCT: 45.6 % (ref 36.0–46.0)
Hemoglobin: 14.1 g/dL (ref 12.0–15.0)
MCH: 28.5 pg (ref 26.0–34.0)
MCHC: 30.9 g/dL (ref 30.0–36.0)
MCV: 92.3 fL (ref 80.0–100.0)
Platelets: 315 10*3/uL (ref 150–400)
RBC: 4.94 MIL/uL (ref 3.87–5.11)
RDW: 13.2 % (ref 11.5–15.5)
WBC: 6.8 10*3/uL (ref 4.0–10.5)
nRBC: 0 % (ref 0.0–0.2)

## 2019-06-07 LAB — TYPE AND SCREEN
ABO/RH(D): A NEG
Antibody Screen: NEGATIVE

## 2019-06-07 LAB — BASIC METABOLIC PANEL
Anion gap: 8 (ref 5–15)
BUN: 15 mg/dL (ref 8–23)
CO2: 26 mmol/L (ref 22–32)
Calcium: 9 mg/dL (ref 8.9–10.3)
Chloride: 107 mmol/L (ref 98–111)
Creatinine, Ser: 0.79 mg/dL (ref 0.44–1.00)
GFR calc Af Amer: >60 mL/min (ref >60)
GFR calc non Af Amer: >60 mL/min (ref >60)
Glucose, Bld: 81 mg/dL (ref 70–99)
Potassium: 3.8 mmol/L (ref 3.5–5.1)
Sodium: 141 mmol/L (ref 135–145)

## 2019-06-07 NOTE — Progress Notes (Signed)
Nurse spoke with [facility], Surgical Coordinator, to make MD aware that pt C/O runny nose for the last 8 months. Pt stated " I think that it is allergies."  [name] made aware of the following: Pt stated that she suspects that she may have a yeast infection or bladder infection.  Nurse asked pt to contact PCP to make aware ASAP. Pt stated that she works for [employer] and would have a urine sample collected there. Nurse provided pt with fax number to fax results.  Nurse advised pt to make surgeon aware.  [name] also made aware that pt called upset stating that she can not have a specimen collected unless [facility] gives her an order for it. Nurse advised pt to PCP to make aware and pt stated that she is frustrated at this point and hung up.

## 2019-06-07 NOTE — Progress Notes (Signed)
Pt denies recent labs.

## 2019-06-07 NOTE — Progress Notes (Signed)
Pt denies SOB and chest pain. Pt stated that she is under the care of Dr. Ubaldo Glassing, Cardiology at [facility]. Nurse requested LOV note, EKG tracing, chest x ray, cardiac cath (pt stated performed by Dr. Ubaldo Glassing) and any cardiac studies from [facility] ( records on chart).   Pt stated that her last dose of Aspirin was " about a week ago."  Pt COVID-19 test result pending; pt reminded to quarantine).  Pt denies that she and significant other tested positive for COVID-19. Pt denies that she and significant other experienced the following symptoms:  Cough yes/no: No Fever (>100.46F)  yes/no: No Runny nose yes/no: No Sore throat yes/no: No Difficulty breathing/shortness of breath  yes/no: No  Have you or a family member traveled in the last 14 days and where? yes/no: No  Pt reminded that hospital visitation restrictions are in effect and the importance of the restrictions.   Pt verbalized understanding of all pre-op instructions.   Pt chart forwarded to PA, Anesthesiology, for review of cardiac hx.

## 2019-06-07 NOTE — Pre-Procedure Instructions (Signed)
    Paige Higgins  06/07/2019     Your procedure is scheduled on Wednesday, June 09, 2019  Report to [facility] Admitting at 10:00 A.M.  Call this number if you have problems the morning of surgery:  [phone]   Remember:Brush your teeth the morning of surgery with your regular toothpaste.  Do not eat or drink after midnight Tuesday, June 08, 2019   Take these medicines the morning of surgery with A SIP OF WATER : atorvastatin (LIPITOR),  ipratropium (ATROVENT)  nasal spray If needed: chlorpheniramine (CHLOR-TRIMETON) for allergies, Flonase nasal spray If needed: SUMAtriptan (IMITREX) for migraine If needed:albuterol (PROVENTIL HFA;VENTOLIN HFA) inhaler ( bring inhaler in with you on day of surgery) Stop taking Aspirin (unless otherwise advised by surgeon), vitamins, fish oil and herbal medications. Do not take any NSAIDs ie: Ibuprofen, Advil, Naproxen (Aleve), Motrin, Excedrin, Diclofenac (Voltaren) Gel, BC and Goody Powder; stop now.  Do not wear jewelry, make-up or nail polish.  Do not wear lotions, powders, or perfumes, or deodorant.  Do not shave 48 hours prior to surgery.   Do not bring valuables to the hospital.  [facility] is not responsible for any belongings or valuables.  Contacts, dentures or bridgework may not be worn into surgery.   Patients discharged the day of surgery will not be allowed to drive home.  Special instructions: Shower the night before and day of surgery with CHG. Please read over the following fact sheets that you were given. Pain Booklet, Coughing and Deep Breathing and Surgical Site Infection Prevention

## 2019-06-08 ENCOUNTER — Encounter (HOSPITAL_COMMUNITY): Payer: Self-pay

## 2019-06-08 LAB — NOVEL CORONAVIRUS, NAA (HOSP ORDER, SEND-OUT TO REF LAB; TAT 18-24 HRS): SARS-CoV-2, NAA: NOT DETECTED

## 2019-06-08 NOTE — Anesthesia Preprocedure Evaluation (Addendum)
Anesthesia Evaluation  Patient identified by MRN, date of birth, ID band Patient awake    Reviewed: Allergy & Precautions, H&P , NPO status , Patient's Chart, lab work & pertinent test results  History of Anesthesia Complications (+) PONV and history of anesthetic complications  Airway Mallampati: II   Neck ROM: full    Dental   Pulmonary asthma , sleep apnea , former smoker,    breath sounds clear to auscultation       Cardiovascular hypertension,  Rhythm:regular Rate:Normal     Neuro/Psych  Headaches, PSYCHIATRIC DISORDERS Depression    GI/Hepatic   Endo/Other  obese  Renal/GU      Musculoskeletal  (+) Arthritis ,   Abdominal   Peds  Hematology   Anesthesia Other Findings   Reproductive/Obstetrics                            Anesthesia Physical Anesthesia Plan  ASA: III  Anesthesia Plan: General   Post-op Pain Management:    Induction: Intravenous  PONV Risk Score and Plan: 4 or greater and Ondansetron, Dexamethasone and Treatment may vary due to age or medical condition  Airway Management Planned: Oral ETT  Additional Equipment:   Intra-op Plan:   Post-operative Plan: Extubation in OR  Informed Consent:   Plan Discussed with: CRNA, Anesthesiologist and Surgeon  Anesthesia Plan Comments: (Follows with cardiology at [facility] for hx of atypical chest pain and SOB. She had a cardiac cath in 2011 with normal coronaries. Nuclear stress 2017 negative for ischemia. Echo 2017 EF > 55%, no significant valvular abnormalities. Seen by Dr. Ubaldo Glassing 02/25/19 for preop clearance. Per note "EKG today reveals sinus rhythm at a rate of 89 with a PR interval of 152 ms, QRS duration of 92 ms with a QTC of 450 ms and a QRS axis -15 degrees. There is no ischemia. She is currently undergoing back surgery. She is able to carry of daily activity with no chest pain. She appears to be at low risk for surgery  from a cardiac standpoint."  CXR 02/25/19 (copy on pt chart): Cardiac shadow is mildly enlarged.  The lungs are well aerated bilaterally.  No focal infiltrate or sizable effusion is seen.  No acute bony abnormality is noted.  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 Impression: No active cardiopulmonary disease.  Nuclear stress 04/08/16 (care everywhere): LVEF= 51%  FINDINGS: Regional wall motion: reveals normal myocardial thickening and wall  motion. The overall quality of the study is excellent.  Artifacts noted: no Left ventricular cavity: normal.  Perfusion Analysis: SPECT images demonstrate homogeneous tracer  distribution throughout the myocardium.   TTE 04/05/16 (care everywhere): INTERPRETATION NORMAL LEFT VENTRICULAR SYSTOLIC FUNCTION NO VALVULAR STENOSIS EF >55% Closest EF: >55% (Estimated) LVH: MILD LVH Tricuspid: TRIVIAL TR Mitral: TRIVIAL MR Aortic: TRIVIAL AR  )       Anesthesia Quick Evaluation

## 2019-06-09 ENCOUNTER — Inpatient Hospital Stay (HOSPITAL_COMMUNITY): Payer: Medicare Other

## 2019-06-09 ENCOUNTER — Other Ambulatory Visit: Payer: Self-pay

## 2019-06-09 ENCOUNTER — Inpatient Hospital Stay (HOSPITAL_COMMUNITY)
Admission: RE | Admit: 2019-06-09 | Discharge: 2019-06-10 | DRG: 455 | Disposition: A | Discharge status: Home / Self Care | Payer: Medicare Other | Attending: Neurosurgery | Admitting: Neurosurgery

## 2019-06-09 ENCOUNTER — Inpatient Hospital Stay (HOSPITAL_COMMUNITY): Payer: Medicare Other | Admitting: Physician Assistant

## 2019-06-09 ENCOUNTER — Encounter (HOSPITAL_COMMUNITY): Payer: Self-pay | Admitting: *Deleted

## 2019-06-09 ENCOUNTER — Inpatient Hospital Stay (HOSPITAL_COMMUNITY)
Admission: RE | Disposition: A | Discharge status: Home / Self Care | Payer: Self-pay | Source: Home / Self Care | Attending: Neurosurgery

## 2019-06-09 ENCOUNTER — Inpatient Hospital Stay (HOSPITAL_COMMUNITY): Payer: Medicare Other | Admitting: Anesthesiology

## 2019-06-09 DIAGNOSIS — Z7951 Long term (current) use of inhaled steroids: Secondary | ICD-10-CM

## 2019-06-09 DIAGNOSIS — Z7982 Long term (current) use of aspirin: Secondary | ICD-10-CM | POA: Diagnosis not present

## 2019-06-09 DIAGNOSIS — G8929 Other chronic pain: Secondary | ICD-10-CM | POA: Diagnosis present

## 2019-06-09 DIAGNOSIS — Z96611 Presence of right artificial shoulder joint: Secondary | ICD-10-CM | POA: Diagnosis present

## 2019-06-09 DIAGNOSIS — Z96653 Presence of artificial knee joint, bilateral: Secondary | ICD-10-CM | POA: Diagnosis present

## 2019-06-09 DIAGNOSIS — I10 Essential (primary) hypertension: Secondary | ICD-10-CM | POA: Diagnosis present

## 2019-06-09 DIAGNOSIS — E785 Hyperlipidemia, unspecified: Secondary | ICD-10-CM | POA: Diagnosis present

## 2019-06-09 DIAGNOSIS — Z90710 Acquired absence of both cervix and uterus: Secondary | ICD-10-CM

## 2019-06-09 DIAGNOSIS — M48062 Spinal stenosis, lumbar region with neurogenic claudication: Principal | ICD-10-CM | POA: Diagnosis present

## 2019-06-09 DIAGNOSIS — Z79899 Other long term (current) drug therapy: Secondary | ICD-10-CM

## 2019-06-09 DIAGNOSIS — J45909 Unspecified asthma, uncomplicated: Secondary | ICD-10-CM | POA: Diagnosis present

## 2019-06-09 DIAGNOSIS — Z87891 Personal history of nicotine dependence: Secondary | ICD-10-CM | POA: Diagnosis not present

## 2019-06-09 DIAGNOSIS — Z96612 Presence of left artificial shoulder joint: Secondary | ICD-10-CM | POA: Diagnosis present

## 2019-06-09 DIAGNOSIS — M4317 Spondylolisthesis, lumbosacral region: Secondary | ICD-10-CM | POA: Diagnosis present

## 2019-06-09 DIAGNOSIS — G4730 Sleep apnea, unspecified: Secondary | ICD-10-CM | POA: Diagnosis present

## 2019-06-09 DIAGNOSIS — M4316 Spondylolisthesis, lumbar region: Secondary | ICD-10-CM | POA: Diagnosis present

## 2019-06-09 DIAGNOSIS — M5116 Intervertebral disc disorders with radiculopathy, lumbar region: Secondary | ICD-10-CM | POA: Diagnosis present

## 2019-06-09 DIAGNOSIS — Z419 Encounter for procedure for purposes other than remedying health state, unspecified: Secondary | ICD-10-CM

## 2019-06-09 DIAGNOSIS — M48061 Spinal stenosis, lumbar region without neurogenic claudication: Secondary | ICD-10-CM | POA: Diagnosis present

## 2019-06-09 DIAGNOSIS — M4800 Spinal stenosis, site unspecified: Secondary | ICD-10-CM | POA: Diagnosis present

## 2019-06-09 SURGERY — POSTERIOR LUMBAR FUSION 2 LEVEL
Anesthesia: General | Site: Spine Lumbar

## 2019-06-09 MED ORDER — SODIUM CHLORIDE 0.9 % IV SOLN
INTRAVENOUS | Status: DC | PRN
  Administered 2019-06-09: 500 mL

## 2019-06-09 MED ORDER — ACETAMINOPHEN 500 MG PO TABS
1000.0000 mg | ORAL_TABLET | Freq: Four times a day (QID) | ORAL | Status: DC
  Administered 2019-06-09 – 2019-06-10 (×3): 1000 mg via ORAL
  Filled 2019-06-09 (×3): qty 2

## 2019-06-09 MED ORDER — BUPIVACAINE-EPINEPHRINE (PF) 0.5% -1:200000 IJ SOLN
INTRAMUSCULAR | Status: DC | PRN
  Administered 2019-06-09: 10 mL

## 2019-06-09 MED ORDER — DIPHENHYDRAMINE HCL 25 MG PO TABS
25.0000 mg | ORAL_TABLET | Freq: Four times a day (QID) | ORAL | Status: DC
  Filled 2019-06-09: qty 1

## 2019-06-09 MED ORDER — HEMOSTATIC AGENTS (NO CHARGE) OPTIME
TOPICAL | Status: DC | PRN
  Administered 2019-06-09: 1

## 2019-06-09 MED ORDER — CEFAZOLIN SODIUM-DEXTROSE 2-4 GM/100ML-% IV SOLN
INTRAVENOUS | Status: AC
  Filled 2019-06-09: qty 100

## 2019-06-09 MED ORDER — ACETAMINOPHEN 325 MG PO TABS: 650.0000 mg | ORAL_TABLET | ORAL | Status: DC | PRN

## 2019-06-09 MED ORDER — SCOPOLAMINE 1 MG/3DAYS TD PT72
1.0000 | MEDICATED_PATCH | TRANSDERMAL | Status: DC
  Administered 2019-06-09: 1.5 mg via TRANSDERMAL

## 2019-06-09 MED ORDER — OXYCODONE HCL 5 MG PO TABS: 10.0000 mg | ORAL_TABLET | ORAL | Status: DC | PRN

## 2019-06-09 MED ORDER — BACITRACIN ZINC 500 UNIT/GM EX OINT
TOPICAL_OINTMENT | CUTANEOUS | Status: AC
  Filled 2019-06-09: qty 28.35

## 2019-06-09 MED ORDER — THROMBIN 5000 UNITS EX SOLR
CUTANEOUS | Status: AC
  Filled 2019-06-09: qty 5000

## 2019-06-09 MED ORDER — SUMATRIPTAN SUCCINATE 50 MG PO TABS
50.0000 mg | ORAL_TABLET | ORAL | Status: DC | PRN
  Filled 2019-06-09: qty 1

## 2019-06-09 MED ORDER — SODIUM CHLORIDE 0.9% FLUSH
3.0000 mL | Freq: Two times a day (BID) | INTRAVENOUS | Status: DC
  Administered 2019-06-09 – 2019-06-10 (×2): 3 mL via INTRAVENOUS

## 2019-06-09 MED ORDER — LACTATED RINGERS IV SOLN
INTRAVENOUS | Status: DC
  Administered 2019-06-09: 11:00:00 via INTRAVENOUS

## 2019-06-09 MED ORDER — ALBUTEROL SULFATE (2.5 MG/3ML) 0.083% IN NEBU: 2.5000 mg | INHALATION_SOLUTION | RESPIRATORY_TRACT | Status: DC | PRN

## 2019-06-09 MED ORDER — ONDANSETRON HCL 4 MG/2ML IJ SOLN
INTRAMUSCULAR | Status: DC | PRN
  Administered 2019-06-09: 4 mg via INTRAVENOUS

## 2019-06-09 MED ORDER — GLYCOPYRROLATE PF 0.2 MG/ML IJ SOSY
PREFILLED_SYRINGE | INTRAMUSCULAR | Status: DC | PRN
  Administered 2019-06-09: .2 mg via INTRAVENOUS

## 2019-06-09 MED ORDER — ONDANSETRON HCL 4 MG/2ML IJ SOLN
INTRAMUSCULAR | Status: AC
  Filled 2019-06-09: qty 4

## 2019-06-09 MED ORDER — FENTANYL CITRATE (PF) 100 MCG/2ML IJ SOLN
INTRAMUSCULAR | Status: DC | PRN
  Administered 2019-06-09: 100 ug via INTRAVENOUS
  Administered 2019-06-09 (×3): 50 ug via INTRAVENOUS

## 2019-06-09 MED ORDER — DEXAMETHASONE SODIUM PHOSPHATE 10 MG/ML IJ SOLN
INTRAMUSCULAR | Status: DC | PRN
  Administered 2019-06-09: 10 mg via INTRAVENOUS

## 2019-06-09 MED ORDER — SODIUM CHLORIDE 0.9 % IV SOLN
INTRAVENOUS | Status: DC | PRN
  Administered 2019-06-09: 15:00:00 50 ug/min via INTRAVENOUS

## 2019-06-09 MED ORDER — ONDANSETRON HCL 4 MG/2ML IJ SOLN
INTRAMUSCULAR | Status: AC
  Filled 2019-06-09: qty 2

## 2019-06-09 MED ORDER — MORPHINE SULFATE (PF) 4 MG/ML IV SOLN: 4.0000 mg | INTRAVENOUS | Status: DC | PRN

## 2019-06-09 MED ORDER — FENTANYL CITRATE (PF) 250 MCG/5ML IJ SOLN
INTRAMUSCULAR | Status: AC
  Filled 2019-06-09: qty 5

## 2019-06-09 MED ORDER — ADULT MULTIVITAMIN W/MINERALS CH
1.0000 | ORAL_TABLET | Freq: Every day | ORAL | Status: DC
  Administered 2019-06-10: 1 via ORAL
  Filled 2019-06-09 (×2): qty 1

## 2019-06-09 MED ORDER — MENTHOL 3 MG MT LOZG
1.0000 | LOZENGE | OROMUCOSAL | Status: DC | PRN
  Filled 2019-06-09: qty 9

## 2019-06-09 MED ORDER — ARTIFICIAL TEARS OPHTHALMIC OINT
TOPICAL_OINTMENT | OPHTHALMIC | Status: AC
  Filled 2019-06-09: qty 17.5

## 2019-06-09 MED ORDER — DEXAMETHASONE SODIUM PHOSPHATE 10 MG/ML IJ SOLN
INTRAMUSCULAR | Status: AC
  Filled 2019-06-09: qty 2

## 2019-06-09 MED ORDER — ATORVASTATIN CALCIUM 40 MG PO TABS
40.0000 mg | ORAL_TABLET | Freq: Every day | ORAL | Status: DC
  Administered 2019-06-10: 40 mg via ORAL
  Filled 2019-06-09: qty 1

## 2019-06-09 MED ORDER — LIDOCAINE 2% (20 MG/ML) 5 ML SYRINGE
INTRAMUSCULAR | Status: DC | PRN
  Administered 2019-06-09: 70 mg via INTRAVENOUS

## 2019-06-09 MED ORDER — OXYCODONE HCL 5 MG PO TABS
5.0000 mg | ORAL_TABLET | ORAL | Status: DC | PRN
  Administered 2019-06-10 (×2): 5 mg via ORAL
  Filled 2019-06-09 (×2): qty 1

## 2019-06-09 MED ORDER — OXYCODONE HCL 5 MG PO TABS: 5.0000 mg | ORAL_TABLET | Freq: Once | ORAL | Status: DC | PRN

## 2019-06-09 MED ORDER — FLUTICASONE PROPIONATE 50 MCG/ACT NA SUSP
2.0000 | Freq: Every day | NASAL | Status: DC | PRN
  Filled 2019-06-09: qty 16

## 2019-06-09 MED ORDER — VITAMIN C 500 MG PO TABS
1000.0000 mg | ORAL_TABLET | Freq: Every day | ORAL | Status: DC
  Administered 2019-06-10: 1000 mg via ORAL
  Filled 2019-06-09: qty 2

## 2019-06-09 MED ORDER — PROPOFOL 10 MG/ML IV BOLUS
INTRAVENOUS | Status: AC
  Filled 2019-06-09: qty 20

## 2019-06-09 MED ORDER — ONDANSETRON HCL 4 MG PO TABS: 4.0000 mg | ORAL_TABLET | Freq: Four times a day (QID) | ORAL | Status: DC | PRN

## 2019-06-09 MED ORDER — BUPIVACAINE LIPOSOME 1.3 % IJ SUSP
INTRAMUSCULAR | Status: DC | PRN
  Administered 2019-06-09: 20 mL

## 2019-06-09 MED ORDER — BUPIVACAINE-EPINEPHRINE (PF) 0.5% -1:200000 IJ SOLN
INTRAMUSCULAR | Status: AC
  Filled 2019-06-09: qty 30

## 2019-06-09 MED ORDER — SUGAMMADEX SODIUM 200 MG/2ML IV SOLN
INTRAVENOUS | Status: DC | PRN
  Administered 2019-06-09: 180 mg via INTRAVENOUS

## 2019-06-09 MED ORDER — ZOLPIDEM TARTRATE 5 MG PO TABS: 5.0000 mg | ORAL_TABLET | Freq: Every evening | ORAL | Status: DC | PRN

## 2019-06-09 MED ORDER — 0.9 % SODIUM CHLORIDE (POUR BTL) OPTIME
TOPICAL | Status: DC | PRN
  Administered 2019-06-09: 1000 mL

## 2019-06-09 MED ORDER — SCOPOLAMINE 1 MG/3DAYS TD PT72
MEDICATED_PATCH | TRANSDERMAL | Status: AC
  Filled 2019-06-09: qty 1

## 2019-06-09 MED ORDER — CYCLOBENZAPRINE HCL 10 MG PO TABS: 10.0000 mg | ORAL_TABLET | Freq: Three times a day (TID) | ORAL | Status: DC | PRN

## 2019-06-09 MED ORDER — ALBUMIN HUMAN 5 % IV SOLN
INTRAVENOUS | Status: DC | PRN
  Administered 2019-06-09 (×2): via INTRAVENOUS

## 2019-06-09 MED ORDER — FENTANYL CITRATE (PF) 100 MCG/2ML IJ SOLN
25.0000 ug | INTRAMUSCULAR | Status: DC | PRN
  Administered 2019-06-09: 25 ug via INTRAVENOUS

## 2019-06-09 MED ORDER — SUCCINYLCHOLINE CHLORIDE 200 MG/10ML IV SOSY
PREFILLED_SYRINGE | INTRAVENOUS | Status: AC
  Filled 2019-06-09: qty 20

## 2019-06-09 MED ORDER — BUPIVACAINE LIPOSOME 1.3 % IJ SUSP
20.0000 mL | Freq: Once | INTRAMUSCULAR | Status: DC
  Filled 2019-06-09: qty 20

## 2019-06-09 MED ORDER — CEFAZOLIN SODIUM-DEXTROSE 2-4 GM/100ML-% IV SOLN
2.0000 g | Freq: Three times a day (TID) | INTRAVENOUS | Status: AC
  Administered 2019-06-09 – 2019-06-10 (×2): 2 g via INTRAVENOUS
  Filled 2019-06-09 (×2): qty 100

## 2019-06-09 MED ORDER — CEFAZOLIN SODIUM-DEXTROSE 2-4 GM/100ML-% IV SOLN
2.0000 g | INTRAVENOUS | Status: AC
  Administered 2019-06-09: 14:00:00 2 g via INTRAVENOUS

## 2019-06-09 MED ORDER — PROPOFOL 10 MG/ML IV BOLUS
INTRAVENOUS | Status: DC | PRN
  Administered 2019-06-09: 150 mg via INTRAVENOUS

## 2019-06-09 MED ORDER — ONDANSETRON HCL 4 MG/2ML IJ SOLN
4.0000 mg | Freq: Four times a day (QID) | INTRAMUSCULAR | Status: AC | PRN
  Administered 2019-06-09: 4 mg via INTRAVENOUS

## 2019-06-09 MED ORDER — IPRATROPIUM BROMIDE 0.06 % NA SOLN
2.0000 | Freq: Two times a day (BID) | NASAL | Status: DC
  Administered 2019-06-09 – 2019-06-10 (×2): 2 via NASAL
  Filled 2019-06-09: qty 15

## 2019-06-09 MED ORDER — GLYCOPYRROLATE PF 0.2 MG/ML IJ SOSY
PREFILLED_SYRINGE | INTRAMUSCULAR | Status: AC
  Filled 2019-06-09: qty 1

## 2019-06-09 MED ORDER — BISACODYL 10 MG RE SUPP: 10.0000 mg | Freq: Every day | RECTAL | Status: DC | PRN

## 2019-06-09 MED ORDER — FENTANYL CITRATE (PF) 100 MCG/2ML IJ SOLN
INTRAMUSCULAR | Status: AC
  Filled 2019-06-09: qty 2

## 2019-06-09 MED ORDER — THROMBIN 5000 UNITS EX SOLR
OROMUCOSAL | Status: DC | PRN
  Administered 2019-06-09 (×2): 5 mL

## 2019-06-09 MED ORDER — ROCURONIUM BROMIDE 10 MG/ML (PF) SYRINGE
PREFILLED_SYRINGE | INTRAVENOUS | Status: AC
  Filled 2019-06-09: qty 20

## 2019-06-09 MED ORDER — OXYCODONE HCL 5 MG/5ML PO SOLN: 5.0000 mg | Freq: Once | ORAL | Status: DC | PRN

## 2019-06-09 MED ORDER — CHLORHEXIDINE GLUCONATE CLOTH 2 % EX PADS: 6.0000 | MEDICATED_PAD | Freq: Once | CUTANEOUS | Status: DC

## 2019-06-09 MED ORDER — TRIAMTERENE-HCTZ 37.5-25 MG PO TABS
1.0000 | ORAL_TABLET | Freq: Every day | ORAL | Status: DC
  Administered 2019-06-09 – 2019-06-10 (×2): 1 via ORAL
  Filled 2019-06-09 (×2): qty 1

## 2019-06-09 MED ORDER — BACITRACIN ZINC 500 UNIT/GM EX OINT
TOPICAL_OINTMENT | CUTANEOUS | Status: DC | PRN
  Administered 2019-06-09: 1 via TOPICAL

## 2019-06-09 MED ORDER — ONDANSETRON HCL 4 MG/2ML IJ SOLN: 4.0000 mg | Freq: Four times a day (QID) | INTRAMUSCULAR | Status: DC | PRN

## 2019-06-09 MED ORDER — SODIUM CHLORIDE 0.9 % IV SOLN
250.0000 mL | INTRAVENOUS | Status: DC
  Administered 2019-06-09: 250 mL via INTRAVENOUS

## 2019-06-09 MED ORDER — DIPHENHYDRAMINE HCL 25 MG PO CAPS
25.0000 mg | ORAL_CAPSULE | Freq: Four times a day (QID) | ORAL | Status: DC
  Administered 2019-06-10: 25 mg via ORAL
  Filled 2019-06-09 (×2): qty 1

## 2019-06-09 MED ORDER — ROCURONIUM BROMIDE 10 MG/ML (PF) SYRINGE
PREFILLED_SYRINGE | INTRAVENOUS | Status: DC | PRN
  Administered 2019-06-09: 20 mg via INTRAVENOUS
  Administered 2019-06-09: 50 mg via INTRAVENOUS

## 2019-06-09 MED ORDER — ACETAMINOPHEN 650 MG RE SUPP: 650.0000 mg | RECTAL | Status: DC | PRN

## 2019-06-09 MED ORDER — PHENOL 1.4 % MT LIQD: 1.0000 | OROMUCOSAL | Status: DC | PRN

## 2019-06-09 MED ORDER — VANCOMYCIN HCL 1000 MG IV SOLR
INTRAVENOUS | Status: AC
  Filled 2019-06-09: qty 1000

## 2019-06-09 MED ORDER — CEFAZOLIN SODIUM 1 G IJ SOLR
INTRAMUSCULAR | Status: AC
  Filled 2019-06-09: qty 20

## 2019-06-09 MED ORDER — SODIUM CHLORIDE 0.9% FLUSH: 3.0000 mL | INTRAVENOUS | Status: DC | PRN

## 2019-06-09 MED ORDER — DOCUSATE SODIUM 100 MG PO CAPS
100.0000 mg | ORAL_CAPSULE | Freq: Two times a day (BID) | ORAL | Status: DC
  Administered 2019-06-09 – 2019-06-10 (×2): 100 mg via ORAL
  Filled 2019-06-09 (×2): qty 1

## 2019-06-09 MED ORDER — LIDOCAINE 2% (20 MG/ML) 5 ML SYRINGE
INTRAMUSCULAR | Status: AC
  Filled 2019-06-09: qty 15

## 2019-06-09 SURGICAL SUPPLY — 67 items
BAG DECANTER FOR FLEXI CONT (MISCELLANEOUS) ×3 IMPLANT
BENZOIN TINCTURE PRP APPL 2/3 (GAUZE/BANDAGES/DRESSINGS) ×3 IMPLANT
BLADE CLIPPER SURG (BLADE) IMPLANT
BUR MATCHSTICK NEURO 3.0 LAGG (BURR) ×3 IMPLANT
BUR PRECISION FLUTE 6.0 (BURR) ×3 IMPLANT
CAGE ALTERA 10X31X9-13 15D (Cage) ×4 IMPLANT
CAGE ALTERA 9-13-15-31MM (Cage) ×2 IMPLANT
CANISTER SUCT 3000ML PPV (MISCELLANEOUS) ×3 IMPLANT
CAP REVERE LOCKING (Cap) ×18 IMPLANT
CARTRIDGE OIL MAESTRO DRILL (MISCELLANEOUS) ×1 IMPLANT
CLOSURE WOUND 1/2 X4 (GAUZE/BANDAGES/DRESSINGS) ×1
CONT SPEC 4OZ CLIKSEAL STRL BL (MISCELLANEOUS) ×3 IMPLANT
COVER BACK TABLE 60X90IN (DRAPES) ×3 IMPLANT
COVER WAND RF STERILE (DRAPES) ×3 IMPLANT
DECANTER SPIKE VIAL GLASS SM (MISCELLANEOUS) ×3 IMPLANT
DIFFUSER DRILL AIR PNEUMATIC (MISCELLANEOUS) ×3 IMPLANT
DRAPE C-ARM 42X72 X-RAY (DRAPES) ×6 IMPLANT
DRAPE HALF SHEET 40X57 (DRAPES) ×3 IMPLANT
DRAPE LAPAROTOMY 100X72X124 (DRAPES) ×3 IMPLANT
DRAPE SURG 17X23 STRL (DRAPES) ×12 IMPLANT
DRSG OPSITE POSTOP 4X8 (GAUZE/BANDAGES/DRESSINGS) ×3 IMPLANT
ELECT BLADE 4.0 EZ CLEAN MEGAD (MISCELLANEOUS) ×3
ELECT REM PT RETURN 9FT ADLT (ELECTROSURGICAL) ×3
ELECTRODE BLDE 4.0 EZ CLN MEGD (MISCELLANEOUS) ×1 IMPLANT
ELECTRODE REM PT RTRN 9FT ADLT (ELECTROSURGICAL) ×1 IMPLANT
GAUZE 4X4 16PLY RFD (DISPOSABLE) ×3 IMPLANT
GAUZE SPONGE 4X4 12PLY STRL (GAUZE/BANDAGES/DRESSINGS) ×3 IMPLANT
GLOVE BIO SURGEON STRL SZ 6.5 (GLOVE) ×14 IMPLANT
GLOVE BIO SURGEON STRL SZ8 (GLOVE) ×6 IMPLANT
GLOVE BIO SURGEON STRL SZ8.5 (GLOVE) ×6 IMPLANT
GLOVE BIO SURGEONS STRL SZ 6.5 (GLOVE) ×7
GLOVE BIOGEL PI IND STRL 6.5 (GLOVE) ×4 IMPLANT
GLOVE BIOGEL PI INDICATOR 6.5 (GLOVE) ×8
GLOVE ECLIPSE 7.5 STRL STRAW (GLOVE) ×3 IMPLANT
GOWN STRL REUS W/ TWL LRG LVL3 (GOWN DISPOSABLE) ×5 IMPLANT
GOWN STRL REUS W/ TWL XL LVL3 (GOWN DISPOSABLE) ×2 IMPLANT
GOWN STRL REUS W/TWL LRG LVL3 (GOWN DISPOSABLE) ×10
GOWN STRL REUS W/TWL XL LVL3 (GOWN DISPOSABLE) ×4
HEMOSTAT POWDER KIT SURGIFOAM (HEMOSTASIS) ×6 IMPLANT
KIT BASIN OR (CUSTOM PROCEDURE TRAY) ×3 IMPLANT
KIT INFUSE X SMALL 1.4CC (Orthopedic Implant) ×3 IMPLANT
KIT TURNOVER KIT B (KITS) ×3 IMPLANT
MILL MEDIUM DISP (BLADE) ×3 IMPLANT
NEEDLE HYPO 21X1.5 SAFETY (NEEDLE) IMPLANT
NEEDLE HYPO 22GX1.5 SAFETY (NEEDLE) ×3 IMPLANT
NS IRRIG 1000ML POUR BTL (IV SOLUTION) ×3 IMPLANT
OIL CARTRIDGE MAESTRO DRILL (MISCELLANEOUS) ×3
PACK LAMINECTOMY NEURO (CUSTOM PROCEDURE TRAY) ×3 IMPLANT
PAD ARMBOARD 7.5X6 YLW CONV (MISCELLANEOUS) ×9 IMPLANT
PATTIES SURGICAL .5 X1 (DISPOSABLE) IMPLANT
PUTTY DBM 10CC CALC GRAN (Putty) ×3 IMPLANT
ROD REVERE CURVED 65MM (Rod) ×6 IMPLANT
SCREW REVERE 6.35 6.5MMX45 (Screw) ×6 IMPLANT
SCREW REVERE 6.5X50MM (Screw) ×12 IMPLANT
SPONGE LAP 4X18 RFD (DISPOSABLE) IMPLANT
SPONGE NEURO XRAY DETECT 1X3 (DISPOSABLE) IMPLANT
SPONGE SURGIFOAM ABS GEL 100 (HEMOSTASIS) IMPLANT
SPONGE SURGIFOAM ABS GEL SZ50 (HEMOSTASIS) ×3 IMPLANT
STRIP CLOSURE SKIN 1/2X4 (GAUZE/BANDAGES/DRESSINGS) ×2 IMPLANT
SUT VIC AB 1 CT1 18XBRD ANBCTR (SUTURE) ×2 IMPLANT
SUT VIC AB 1 CT1 8-18 (SUTURE) ×4
SUT VIC AB 2-0 CP2 18 (SUTURE) ×6 IMPLANT
SYR 20CC LL (SYRINGE) IMPLANT
TOWEL GREEN STERILE (TOWEL DISPOSABLE) ×3 IMPLANT
TOWEL GREEN STERILE FF (TOWEL DISPOSABLE) ×3 IMPLANT
TRAY FOLEY MTR SLVR 16FR STAT (SET/KITS/TRAYS/PACK) ×3 IMPLANT
WATER STERILE IRR 1000ML POUR (IV SOLUTION) ×3 IMPLANT

## 2019-06-09 NOTE — Anesthesia Procedure Notes (Signed)
Procedure Name: Intubation Date/Time: 06/09/2019 2:13 PM Performed by: Cleda Daub, CRNA Pre-anesthesia Checklist: Patient identified, Emergency Drugs available, Suction available and Patient being monitored Patient Re-evaluated:Patient Re-evaluated prior to induction Oxygen Delivery Method: Circle system utilized Preoxygenation: Pre-oxygenation with 100% oxygen Induction Type: IV induction Laryngoscope Size: Mac and 3 Grade View: Grade I Tube size: 7.0 mm Number of attempts: 1 Airway Equipment and Method: Stylet Placement Confirmation: ETT inserted through vocal cords under direct vision,  positive ETCO2 and breath sounds checked- equal and bilateral Secured at: 20 cm Tube secured with: Tape Dental Injury: Teeth and Oropharynx as per pre-operative assessment

## 2019-06-09 NOTE — Op Note (Signed)
Brief history:The patient is a  73 year old white female on whom I previously performed a lumbar laminectomy.  She has had chronic back pain.  Her pain has worsened.  She was worked up with lumbar x-rays and lumbar MRI which demonstrated multilevel degenerative changes with an L4-5 and L5-S1 spondylolisthesis, foraminal stenosis, etc.  I discussed the various treatment options with her.  She has weighed the risks, benefits, and alternatives to surgery and decided proceed with a L4-5 and L5-S1 decompression, instrumentation and fusion.  Preoperative diagnosis:  L4-5 and L5-S1 spondylolisthesis, foraminal stenosis,Degenerative disc disease, spinal stenosis compressing both the  L4, L5 and S1 nerve roots; lumbago; lumbar radiculopathy; neurogenic claudication  Postoperative diagnosis: the same  Procedure:  Redo right L4-5 and L5-S1Laminotomy/foraminotomies/medial facetectomy to decompress the right L4, L5 and S1nerve roots(the work required to do this was in addition to the work required to do the posterior lumbar interbody fusion because of the patient's spinal stenosis, facet arthropathy. Etc. requiring a wide decompression of the nerve roots.); right L4-5 and L5-S1 transforaminal lumbar interbody fusion with local morselized autograft bone, bone morphogenic protein soaked collagen sponges, and Zimmer DBM; insertion of interbody prosthesis at L4-5 and L5-S1(globus peek expandable interbody prosthesis); posterior  Segmental instrumentation from L4 to S1with globus titanium pedicle screws and rods; posterior lateral arthrodesis at L4-5 and L5-S1with local morselized autograft bone, bone morphogenic protein soaked collagen sponges and Zimmer DBM.  Surgeon: Dr. Delma OfficerJeff Kyasia Steuck  Asst.: Val EagleMeghan Bergman, nurse practitioner  Anesthesia: Gen. endotracheal  Estimated blood loss: 300 cc  Drains: None  Complications: None  Description of procedure: The patient was brought to the operating room by the anesthesia  team. General endotracheal anesthesia was induced. The patient was turned to the prone position on the Wilson frame. The patient's lumbosacral region was then prepared with Betadine scrub and Betadine solution. Sterile drapes were applied.  I then injected the area to be incised with Marcaine with epinephrine solution. I then used the scalpel to make a linear midline incision over the L4-5 and L5-S1interspace,  Incising through the old surgical scar. I then used electrocautery to perform a bilateral subperiosteal dissection exposing the spinous process and lamina of L5 and upper sacrum and the facets bilaterally at L4-5. We then obtained intraoperative radiograph to confirm our location. We then inserted the Verstrac retractor to provide exposure.  I began the decompression by using the high speed drill to perform laminotomies at redo  Right laminotomyat L4-5 and  Right L5-S1 laminotomy. We then used the Kerrison punches to widen the laminotomy and removed the ligamentum flavum at L5-S1 on the right and the epidural scar tissue at L4-5 on the right. We used the Kerrison punches to remove the medial facets at L4-5 and L5-S1 on the right. We performed wide foraminotomies about the Right L4, L5 and S1nerve roots completing the decompression.  We now turned our attention to the posterior lumbar interbody fusion. I used a scalpel to incise the intervertebral disc at L4-5 and L5-S1 on the right. I then performed a partial intervertebral discectomy at L4-5 and L5-S1 on the right using the pituitary forceps. We prepared the vertebral endplates at L4-5 and L5-S791for the fusion by removing the soft tissues with the curettes. We then used the trial spacers to pick the appropriate sized interbody prosthesis. We prefilled his prosthesis with a combination of local morselized autograft bone that we obtained during the decompression as well as Zimmer DBM. We inserted the prefilled prosthesis into the interspace at L4-5  and  L5-S1 from the right, we then turned and expanded the prosthesis. There was a good snug fit of the prosthesis in the interspace. We then filled and the remainder of the intervertebral disc space with local morselized autograft bone and Zimmer DBM. This completed the posterior lumbar interbody arthrodesis.  We now turned attention to the instrumentation. Under fluoroscopic guidance we cannulated the bilateral L4, L5 and S1pedicles with the bone probe. We then removed the bone probe. We then tapped the pedicle with a 5.34millimeter tap. We then removed the tap. We probed inside the tapped pedicle with a ball probe to rule out cortical breaches. We then inserted a 6.5 x 45 and 72millimeter pedicle screw into the  L4, L5 and S1pedicles bilaterally under fluoroscopic guidance. We then palpated along the medial aspect of the pedicles to rule out cortical breaches. There were none. The nerve roots were not injured. We then connected the unilateral pedicle screws with a lordotic rod. We compressed the construct and secured the rod in place with the caps. We then tightened the caps appropriately. This completed the instrumentation from L4-S1 bilaterally.  We now turned our attention to the posterior lateral arthrodesis at L4-5 and L5-S1 bilaterally. We used the high-speed drill to decorticate the remainder of the facets, pars, transverse process at L4-5 and L5-S1 bilaterally. We then applied a combination of local morselized autograft bone and Zimmer DBM over these decorticated posterior lateral structures. This completed the posterior lateral arthrodesis.  We then obtained hemostasis using bipolar electrocautery. We irrigated the wound out with bacitracin solution. We inspected the thecal sac and nerve roots and noted they were well decompressed. We then removed the retractor.   We injected Exparel . We reapproximated patient's thoracolumbar fascia with interrupted #1 Vicryl suture. We reapproximated patient's  subcutaneous tissue with interrupted 2-0 Vicryl suture. The reapproximated patient's skin with Steri-Strips and benzoin. The wound was then coated with bacitracin ointment. A sterile dressing was applied. The drapes were removed. The patient was subsequently returned to the supine position where they were extubated by the anesthesia team. He was then transported to the post anesthesia care unit in stable condition. All sponge instrument and needle counts were reportedly correct at the end of this case.

## 2019-06-09 NOTE — H&P (Signed)
Subjective: 73 year old white female on whom I performed an L3-4 and L4-5 laminectomy years ago.  She has complained of back and leg pain consistent with neurogenic claudication.  She has failed medical management and was worked up with a lumbar MRI and lumbar x-rays which demonstrated L4-5 and L5-S1 spondylolisthesis, foraminal stenosis, etc.  I discussed the various treatment options with the patient including surgery.  She is weighed the risks, benefits and alternatives surgery and decided proceed with an L4-5 and L5-S1 decompression, instrumentation and fusion.  Past Medical History:  Diagnosis Date  . Allergies   . Arthritis   . Asthma   . Depression   . Headache(784.0)   . Heart murmur   . Hyperlipidemia   . Hypertension   . PONV (postoperative nausea and vomiting)   . Recurrent UTI   . Sleep apnea    does not wear CPAP  . Spondylolisthesis of lumbar region   . Wears contact lenses     Past Surgical History:  Procedure Laterality Date  . ABDOMINAL HYSTERECTOMY    . ARTHROPLASTY  07/07/2012   LEFT SHOULDER  . BREAST BIOPSY Left 2000  . CARDIAC CATHETERIZATION    . JOINT REPLACEMENT     bilateral knee replacements  . LUMBAR LAMINECTOMY/DECOMPRESSION MICRODISCECTOMY N/A 04/28/2014   Procedure: LUMBAR THREE TO FOUR, LUMBAR FOUR TO FIVE  LAMINECTOMY/DECOMPRESSION MICRODISCECTOMY 2 LEVELS;  Surgeon: Cristi LoronJeffrey D Meghan Tiemann, MD;  Location: [facility];  Service: Neurosurgery;  Laterality: N/A;  L34 L45 laminectomies  . REVERSE SHOULDER ARTHROPLASTY  07/07/2012   Procedure: REVERSE SHOULDER ARTHROPLASTY;  Surgeon: Mable ParisJustin William Chandler, MD;  [facility];  Service: Orthopedics;  Laterality: Left;  . REVERSE SHOULDER ARTHROPLASTY Right 03/10/2018   Procedure: REVERSE SHOULDER ARTHROPLASTY;  Surgeon: Christena FlakePoggi, John J, MD;  Location: [facility] ORS;  Service: Orthopedics;  Laterality: Right;  . SHOULDER ARTHROSCOPY     right shoulder    No Known Allergies  Social History   Tobacco Use  .  Smoking status: Former Smoker    Packs/day: 0.50    Years: 8.00    Pack years: 4.00    Types: Cigarettes    Quit date: 01/08/1986    Years since quitting: 33.4  . Smokeless tobacco: Never Used  Substance Use Topics  . Alcohol use: No    Family History  Problem Relation Age of Onset  . Breast cancer Neg Hx    Prior to Admission medications   Medication Sig Start Date End Date Taking? Authorizing Provider  albuterol (PROVENTIL HFA;VENTOLIN HFA) 108 (90 BASE) MCG/ACT inhaler Inhale 2 puffs into the lungs every 4 (four) hours as needed. For wheezing.   Yes [provider]  Ascorbic Acid (VITAMIN C) 1000 MG tablet Take 1,000 mg by mouth daily.   Yes [provider]  aspirin EC 81 MG tablet Take 81 mg by mouth every 6 (six) hours as needed for moderate pain.    Yes [provider]  aspirin-acetaminophen-caffeine (EXCEDRIN EXTRA STRENGTH) [phone] MG tablet Take 2 tablets by mouth every 6 (six) hours as needed for headache or migraine.    Yes [provider]  atorvastatin (LIPITOR) 40 MG tablet Take 40 mg by mouth daily.   Yes [provider]  chlorpheniramine (CHLOR-TRIMETON) 4 MG tablet Take 4 mg by mouth 2 (two) times daily as needed for allergies.   Yes [provider]  diclofenac sodium (VOLTAREN) 1 % GEL Apply 2 g topically 3 (three) times daily as needed (joint pain).  Yes [provider]  fluticasone (FLONASE) 50 MCG/ACT nasal spray Place 2 sprays into both nostrils daily as needed for allergies or rhinitis.   Yes [provider]  ipratropium (ATROVENT) 0.06 % nasal spray Place 2 sprays into both nostrils 2 (two) times a day. 05/06/18  Yes [provider]  Multiple Vitamin (MULTIVITAMIN) tablet Take 1 tablet by mouth daily.   Yes [provider]  SUMAtriptan (IMITREX) 50 MG tablet Take 50 mg by mouth every 2 (two) hours as needed for migraine.    Yes [provider]   triamterene-hydrochlorothiazide (MAXZIDE-25) 37.5-25 MG per tablet Take 1 tablet by mouth daily.   Yes [provider]  oxyCODONE (OXY IR/ROXICODONE) 5 MG immediate release tablet Take 1-2 tablets (5-10 mg total) by mouth every 4 (four) hours as needed for moderate pain. Patient not taking: Reported on 06/02/2019 03/11/18   Anson OregonMcGhee, James Lance, PA-C     Review of Systems  Positive ROS: As above  All other systems have been reviewed and were otherwise negative with the exception of those mentioned in the HPI and as above.  Objective: Vital signs in last 24 hours: Temp:  [98.2 F (36.8 C)] 98.2 F (36.8 C) (07/01 1031) Pulse Rate:  [74] 74 (07/01 1031) Resp:  [20] 20 (07/01 1031) BP: (167)/(92) 167/92 (07/01 1031) SpO2:  [100 %] 100 % (07/01 1031) Weight:  [77.8 kg] 77.8 kg (07/01 1031) Estimated body mass index is 31.37 kg/m as calculated from the following:   Height as of this encounter: 5\' 2"  (1.575 m).   Weight as of this encounter: 77.8 kg.   General Appearance: Alert Head: Normocephalic, without obvious abnormality, atraumatic Eyes: PERRL, conjunctiva/corneas clear, EOM's intact,    Ears: Normal  Throat: Normal  Neck: Supple, Back: Her lumbar incision is well-healed. Lungs: Clear to auscultation bilaterally, respirations unlabored Heart: Regular rate and rhythm, no murmur, rub or gallop Abdomen: Soft, non-tender Extremities: Extremities normal, atraumatic, no cyanosis or edema Skin: unremarkable  NEUROLOGIC:   Mental status: alert and oriented,Motor Exam - grossly normal Sensory Exam - grossly normal Reflexes:  Coordination - grossly normal Gait - grossly normal Balance - grossly normal Cranial Nerves: I: smell Not tested  II: visual acuity  OS: Normal  OD: Normal   II: visual fields Full to confrontation  II: pupils Equal, round, reactive to light  III,VII: ptosis None  III,IV,VI: extraocular muscles  Full ROM  V: mastication Normal  V: facial  light touch sensation  Normal  V,VII: corneal reflex  Present  VII: facial muscle function - upper  Normal  VII: facial muscle function - lower Normal  VIII: hearing Not tested  IX: soft palate elevation  Normal  IX,X: gag reflex Present  XI: trapezius strength  5/5  XI: sternocleidomastoid strength 5/5  XI: neck flexion strength  5/5  XII: tongue strength  Normal    Data Review Lab Results  Component Value Date   WBC 6.8 06/07/2019   HGB 14.1 06/07/2019   HCT 45.6 06/07/2019   MCV 92.3 06/07/2019   PLT 315 06/07/2019   Lab Results  Component Value Date   NA 141 06/07/2019   K 3.8 06/07/2019   CL 107 06/07/2019   CO2 26 06/07/2019   BUN 15 06/07/2019   CREATININE 0.79 06/07/2019   GLUCOSE 81 06/07/2019   Lab Results  Component Value Date   INR 0.94 02/27/2018    Assessment/Plan: L4-5 and L5-S1 spondylolisthesis, spinal stenosis, lumbago, lumbar radiculopathy, neurogenic claudication:  I have discussed the situation with the patient.  I reviewed her imaging studies with her and pointed out the abnormalities.  We have discussed the various treatment options including surgery.  I have described the surgical treatment option of a L4-5 and L5-S1 decompression, instrumentation and fusion.  I have shown her surgical models.  I have given her a surgical pamphlet.  We have discussed the risks, benefits, alternatives, expected postoperative course, and likelihood of achieving our goals with surgery.  I have answered all the patient's questions.  She has decided proceed with surgery.   Ophelia Charter 06/09/2019 1:35 PM

## 2019-06-09 NOTE — Transfer of Care (Signed)
Immediate Anesthesia Transfer of Care Note  Patient: Paige Higgins  Procedure(s) Performed: Lumbar 4-5 Lumbar 5 Sacral 1 Posterior lumbar interbody fusion/interbody prosthesis/posterior instrumentation (N/A Spine Lumbar)  Patient Location: PACU  Anesthesia Type:General  Level of Consciousness: awake, alert , oriented and patient cooperative  Airway & Oxygen Therapy: Patient Spontanous Breathing and Patient connected to face mask oxygen  Post-op Assessment: Report given to RN and Post -op Vital signs reviewed and stable  Post vital signs: Reviewed, stable  Last Vitals:  Vitals Value Taken Time  BP    Temp    Pulse 104 06/09/19 1835  Resp 23 06/09/19 1835  SpO2 98 % 06/09/19 1835  Vitals shown include unvalidated device data.  Last Pain:  Vitals:   06/09/19 1031  TempSrc: Oral         Complications: No apparent anesthesia complications

## 2019-06-09 NOTE — Progress Notes (Signed)
Patient stated that yesterday she gave a urine sample to [facility] in [place].  Patient stated that she thinks she has a UTI and that is why a urine sample to check.  At this time we do not have results but are contacting Dr. Arnoldo Morale office to see  If they have the results.

## 2019-06-09 NOTE — Anesthesia Postprocedure Evaluation (Signed)
Anesthesia Post Note  Patient: Paige Higgins  Procedure(s) Performed: Lumbar 4-5 Lumbar 5 Sacral 1 Posterior lumbar interbody fusion/interbody prosthesis/posterior instrumentation (N/A Spine Lumbar)     Patient location during evaluation: PACU Anesthesia Type: General Level of consciousness: sedated, patient cooperative and oriented Pain management: pain level controlled Vital Signs Assessment: post-procedure vital signs reviewed and stable Respiratory status: spontaneous breathing, nonlabored ventilation, respiratory function stable and patient connected to nasal cannula oxygen Cardiovascular status: blood pressure returned to baseline and stable Postop Assessment: no apparent nausea or vomiting Anesthetic complications: no    Last Vitals:  Vitals:   06/09/19 1930 06/09/19 1945  BP: (!) 113/56 (!) 114/56  Pulse: 86 85  Resp: 18 17  Temp:    SpO2: 96% 94%    Last Pain:  Vitals:   06/09/19 1930  TempSrc:   PainSc: Asleep                 Kaleb Linquist,E. Joliyah Lippens

## 2019-06-09 NOTE — Progress Notes (Signed)
Report received from CJ, RN

## 2019-06-10 LAB — BASIC METABOLIC PANEL
Anion gap: 10 (ref 5–15)
BUN: 15 mg/dL (ref 8–23)
CO2: 25 mmol/L (ref 22–32)
Calcium: 8.3 mg/dL — ABNORMAL LOW (ref 8.9–10.3)
Chloride: 106 mmol/L (ref 98–111)
Creatinine, Ser: 0.84 mg/dL (ref 0.44–1.00)
GFR calc Af Amer: >60 mL/min (ref >60)
GFR calc non Af Amer: >60 mL/min (ref >60)
Glucose, Bld: 137 mg/dL — ABNORMAL HIGH (ref 70–99)
Potassium: 3.8 mmol/L (ref 3.5–5.1)
Sodium: 141 mmol/L (ref 135–145)

## 2019-06-10 LAB — CBC
HCT: 33.3 % — ABNORMAL LOW (ref 36.0–46.0)
Hemoglobin: 10.6 g/dL — ABNORMAL LOW (ref 12.0–15.0)
MCH: 28.8 pg (ref 26.0–34.0)
MCHC: 31.8 g/dL (ref 30.0–36.0)
MCV: 90.5 fL (ref 80.0–100.0)
Platelets: 268 10*3/uL (ref 150–400)
RBC: 3.68 MIL/uL — ABNORMAL LOW (ref 3.87–5.11)
RDW: 13.2 % (ref 11.5–15.5)
WBC: 14.9 10*3/uL — ABNORMAL HIGH (ref 4.0–10.5)
nRBC: 0 % (ref 0.0–0.2)

## 2019-06-10 MED ORDER — CYCLOBENZAPRINE HCL 10 MG PO TABS: 10.0000 mg | ORAL_TABLET | Freq: Three times a day (TID) | ORAL | 0 refills | Status: DC | PRN

## 2019-06-10 MED ORDER — DOCUSATE SODIUM 100 MG PO CAPS: 100.0000 mg | ORAL_CAPSULE | Freq: Two times a day (BID) | ORAL | 0 refills | Status: DC

## 2019-06-10 MED ORDER — OXYCODONE HCL 5 MG PO TABS: 5.0000 mg | ORAL_TABLET | ORAL | 0 refills | Status: DC | PRN

## 2019-06-10 MED FILL — Gelatin Absorbable MT Powder: OROMUCOSAL | Qty: 1 | Status: AC

## 2019-06-10 MED FILL — Thrombin For Soln 5000 Unit: CUTANEOUS | Qty: 5000 | Status: AC

## 2019-06-10 NOTE — Evaluation (Signed)
Occupational Therapy Evaluation Patient Details Name: Paige Higgins MRN: [id number] DOB: [birth year] Today's Date: 06/10/2019    History of Present Illness 73 yo female s/p posterior lumbar fusion 2 level. PMH includes but not limited to: Hypertension, arthritis, Spondylolisthesis of lumbar region.   Clinical Impression   Pt admitted with the above diagnoses and presents with below problem list. PTA pt was independent with ADLs. Pt is currently set up to min guard with ADLs, toilet and shower transfers, and functional mobility. Pt tolerated session well. All education completed with pt on strategies for ADLs with back precautions. Pt is for d/c home today. No further OT needs indicated at this time. OT signing off.     Follow Up Recommendations  No OT follow up;Supervision - Intermittent    Equipment Recommendations  3 in 1 bedside commode    Recommendations for Other Services PT consult     Precautions / Restrictions Precautions Precautions: Back Precaution Booklet Issued: Yes (comment) Required Braces or Orthoses: Spinal Brace Spinal Brace: Lumbar corset Restrictions Weight Bearing Restrictions: No      Mobility Bed Mobility               General bed mobility comments: up in recliner. reviewed technique  Transfers Overall transfer level: Needs assistance Equipment used: None Transfers: Sit to/from Stand Sit to Stand: Min guard              Balance Overall balance assessment: Mild deficits observed, not formally tested                                         ADL either performed or assessed with clinical judgement   ADL Overall ADL's : Needs assistance/impaired Eating/Feeding: Set up;Sitting   Grooming: Min guard;Standing;Supervision/safety   Upper Body Bathing: Set up;Sitting   Lower Body Bathing: Min guard;Sit to/from stand   Upper Body Dressing : Set up;Sitting   Lower Body Dressing: Sit to/from stand;Min guard   Toilet  Transfer: Min guard   Toileting- Clothing Manipulation and Hygiene: Min guard;Sit to/from stand   Tub/ Shower Transfer: Tub transfer;Min guard;Ambulation   Functional mobility during ADLs: Supervision/safety;Min guard(Pt requesting +1 HHA for comfort) General ADL Comments: Educated on ADL strategies with back precautions     Vision         Perception     Praxis      Pertinent Vitals/Pain Pain Assessment: 0-10 Pain Score: 1  Pain Location: back Pain Descriptors / Indicators: Sore Pain Intervention(s): Monitored during session;Repositioned     Hand Dominance Right   Extremity/Trunk Assessment Upper Extremity Assessment Upper Extremity Assessment: Overall WFL for tasks assessed   Lower Extremity Assessment Lower Extremity Assessment: Defer to PT evaluation       Communication Communication Communication: No difficulties   Cognition Arousal/Alertness: Awake/alert Behavior During Therapy: WFL for tasks assessed/performed Overall Cognitive Status: Within Functional Limits for tasks assessed                                     General Comments       Exercises     Shoulder Instructions      Home Living Family/patient expects to be discharged to:: Private residence Living Arrangements: Spouse/significant other Available Help at Discharge: Friend(s);Available 24 hours/day("domestic partner") Type of Home: House Home Access: Level entry  Home Layout: One level     Bathroom Shower/Tub: Chief Strategy OfficerTub/shower unit   Bathroom Toilet: Standard     Home Equipment: Environmental consultantWalker - 2 wheels;Cane - single point;Toilet riser;Wheelchair - manual          Prior Functioning/Environment Level of Independence: Independent                 OT Problem List: Impaired balance (sitting and/or standing);Decreased knowledge of use of DME or AE;Decreased knowledge of precautions;Pain      OT Treatment/Interventions:      OT Goals(Current goals can be found in the  care plan section) Acute Rehab OT Goals Patient Stated Goal: home today OT Goal Formulation: With patient  OT Frequency:     Barriers to D/C:            Co-evaluation              AM-PAC OT "6 Clicks" Daily Activity     Outcome Measure Help from another person eating meals?: None Help from another person taking care of personal grooming?: None Help from another person toileting, which includes using toliet, bedpan, or urinal?: None Help from another person bathing (including washing, rinsing, drying)?: A Little Help from another person to put on and taking off regular upper body clothing?: None Help from another person to put on and taking off regular lower body clothing?: A Little 6 Click Score: 22   End of Session Equipment Utilized During Treatment: Back brace  Activity Tolerance: Patient tolerated treatment well Patient left: in chair;with call bell/phone within reach  OT Visit Diagnosis: Unsteadiness on feet (R26.81);Pain                Time: [id number] OT Time Calculation (min): 15 min Charges:  OT General Charges $OT Visit: 1 Visit OT Evaluation $OT Eval Low Complexity: 1 Low  Raynald KempKathryn Justis Closser, OT Acute Rehabilitation Services Pager: [phone] Office: [phone]   Pilar GrammesMathews, Tayven Renteria H 06/10/2019, 12:10 PM

## 2019-06-10 NOTE — Evaluation (Signed)
Physical Therapy Evaluation Patient Details Name: Paige DungLinda Barbour Bednarz MRN: [id number] DOB: May 17, 1946 Today's Date: 06/10/2019   History of Present Illness  73 yo female s/p posterior lumbar fusion 2 level. PMH includes but not limited to: Hypertension, arthritis, Spondylolisthesis of lumbar region.  Clinical Impression  Patient evaluated by Physical Therapy with no further acute PT needs identified. Pt ambulating 250 feet with handheld assist, negotiated 5 steps with right railing. Recommended cane for mobility, particularly over unlevel surfaces for balance. Education re: spinal precautions, activity recommendations. All education has been completed and the patient has no further questions. No follow-up Physical Therapy or equipment needs. PT is signing off. Thank you for this referral.     Follow Up Recommendations No PT follow up    Equipment Recommendations  None recommended by PT (has all DME)   Recommendations for Other Services       Precautions / Restrictions Precautions Precautions: Back Precaution Booklet Issued: Yes (comment) Required Braces or Orthoses: Spinal Brace Spinal Brace: Lumbar corset Restrictions Weight Bearing Restrictions: No      Mobility  Bed Mobility               General bed mobility comments: up in recliner. reviewed technique  Transfers Overall transfer level: Modified independent Equipment used: None Transfers: Sit to/from Stand Sit to Stand: Modified independent (Device/Increase time)            Ambulation/Gait Ambulation/Gait assistance: Min guard Gait Distance (Feet): 250 Feet Assistive device: 1 person hand held assist Gait Pattern/deviations: Step-through pattern;Decreased stride length Gait velocity: decreased   General Gait Details: Utilizing light hand held assist, slower speed. No overt LOB  Stairs Stairs: Yes Stairs assistance: Min guard Stair Management: One rail Right Number of Stairs: 5 General stair  comments: step over step pattern ascending, step by step descending.   Wheelchair Mobility    Modified Rankin (Stroke Patients Only)       Balance Overall balance assessment: Mild deficits observed, not formally tested                                           Pertinent Vitals/Pain Pain Assessment: 0-10 Pain Score: 1  Pain Location: back Pain Descriptors / Indicators: Sore Pain Intervention(s): Monitored during session    Home Living Family/patient expects to be discharged to:: Private residence Living Arrangements: Spouse/significant other Available Help at Discharge: Friend(s);Available 24 hours/day("domestic partner") Type of Home: House Home Access: Level entry     Home Layout: One level Home Equipment: Walker - 2 wheels;Cane - single point;Toilet riser;Wheelchair - manual      Prior Function Level of Independence: Independent               Hand Dominance   Dominant Hand: Right    Extremity/Trunk Assessment   Upper Extremity Assessment Upper Extremity Assessment: Overall WFL for tasks assessed    Lower Extremity Assessment Lower Extremity Assessment: Overall WFL for tasks assessed       Communication   Communication: No difficulties  Cognition Arousal/Alertness: Awake/alert Behavior During Therapy: WFL for tasks assessed/performed Overall Cognitive Status: Within Functional Limits for tasks assessed                                        General Comments  Exercises     Assessment/Plan    PT Assessment Patent does not need any further PT services  PT Problem List         PT Treatment Interventions      PT Goals (Current goals can be found in the Care Plan section)  Acute Rehab PT Goals Patient Stated Goal: home today PT Goal Formulation: All assessment and education complete, DC therapy    Frequency     Barriers to discharge        Co-evaluation               AM-PAC PT "6  Clicks" Mobility  Outcome Measure Help needed turning from your back to your side while in a flat bed without using bedrails?: None Help needed moving from lying on your back to sitting on the side of a flat bed without using bedrails?: None Help needed moving to and from a bed to a chair (including a wheelchair)?: None Help needed standing up from a chair using your arms (e.g., wheelchair or bedside chair)?: None Help needed to walk in hospital room?: A Little Help needed climbing 3-5 steps with a railing? : A Little 6 Click Score: 22    End of Session Equipment Utilized During Treatment: Gait belt Activity Tolerance: Patient tolerated treatment well Patient left: in chair;with call bell/phone within reach Nurse Communication: Mobility status PT Visit Diagnosis: Pain;Difficulty in walking, not elsewhere classified (R26.2) Pain - part of body: (back)    Time: 4854-6270 PT Time Calculation (min) (ACUTE ONLY): 20 min   Charges:   PT Evaluation $PT Eval Low Complexity: [place], PT, DPT Acute Rehabilitation Services Pager [phone] Office [phone]   Willy Eddy 06/10/2019, 12:53 PM

## 2019-06-10 NOTE — Progress Notes (Signed)
Pt with d/c orders. Discharge paperwork reviewed with pt and all questions answered. IV removed. Prescriptions electronically faxed to pharmacy. Pt escorted to vehicle via wheelchair with all belongings.

## 2019-06-10 NOTE — Progress Notes (Signed)
Orthopedic Tech Progress Note Patient Details:  Paige Higgins 03-26-1946 [id number]  Patient ID: [name], female   DOB: 05-29-1946, 73 y.o.   MRN: [id number]   [name] 06/10/2019, 8:23 [facility] [employer] for Lumbar brace.

## 2019-06-10 NOTE — Progress Notes (Signed)
Subjective: The patient is alert and pleasant.  She looks well.  She has not mobilized yet because she does not have her corset.  She wants to go home.  Objective: Vital signs in last 24 hours: Temp:  [97 F (36.1 C)-98.9 F (37.2 C)] 98.9 F (37.2 C) (07/02 0056) Pulse Rate:  [74-103] 85 (07/01 1945) Resp:  [17-20] 17 (07/01 1945) BP: (113-167)/(56-92) 114/56 (07/01 1945) SpO2:  [94 %-100 %] 94 % (07/01 1945) Weight:  [77.8 kg] 77.8 kg (07/01 1031) Estimated body mass index is 31.37 kg/m as calculated from the following:   Height as of this encounter: 5\' 2"  (1.575 m).   Weight as of this encounter: 77.8 kg.   Intake/Output from previous day: 07/01 0701 - 07/02 0700 In: 2600 [I.V.:1500; IV Piggyback:950] Out: 750 [Urine:500; Blood:250] Intake/Output this shift: No intake/output data recorded.  Physical exam the patient is alert and pleasant.  She is moving her lower extremities well.  Lab Results: Recent Labs    06/07/19 0912 06/10/19 0540  WBC 6.8 14.9*  HGB 14.1 10.6*  HCT 45.6 33.3*  PLT 315 268   BMET Recent Labs    06/07/19 0912 06/10/19 0540  NA 141 141  K 3.8 3.8  CL 107 106  CO2 26 25  GLUCOSE 81 137*  BUN 15 15  CREATININE 0.79 0.84  CALCIUM 9.0 8.3*    Studies/Results: Dg Lumbar Spine 2-3 Views  Result Date: 06/09/2019 CLINICAL DATA:  73 year old female undergoing lumbar surgery. EXAM: DG C-ARM 61-120 MIN; LUMBAR SPINE - 2-3 VIEW COMPARISON:  Intraoperative image at 1455 hours today. Lumbar MRI 09/08/2018. FINDINGS: Two intraoperative fluoroscopic spot views of the lower lumbar spine demonstrate bilateral pedicle screw placement and interbody implants at L4-L5 and L5-S1. IMPRESSION: Posterior and interbody fusion underway at L4-L5 and L5-S1. FLUOROSCOPY TIME:  0 minutes 57 seconds Electronically Signed   By: Genevie Ann M.D.   On: 06/09/2019 18:51   Dg Lumbar Spine 1 View  Result Date: 06/09/2019 CLINICAL DATA:  73 year old female undergoing lumbar  surgery. EXAM: LUMBAR SPINE - 1 VIEW COMPARISON:  [facility] lumbar MRI 09/08/2018. FINDINGS: Intraoperative portable cross-table lateral view of the lumbar spine labeled #1 at 1455 hours. Stable spondylolisthesis, most pronounced at L4-L5. A surgical probe is directed toward the superior S1 endplate/L5-S1 disc level. IMPRESSION: Intraoperative localization at L5-S1. Electronically Signed   By: Genevie Ann M.D.   On: 06/09/2019 18:49   Dg C-arm 1-60 Min  Result Date: 06/09/2019 CLINICAL DATA:  73 year old female undergoing lumbar surgery. EXAM: DG C-ARM 61-120 MIN; LUMBAR SPINE - 2-3 VIEW COMPARISON:  Intraoperative image at 1455 hours today. Lumbar MRI 09/08/2018. FINDINGS: Two intraoperative fluoroscopic spot views of the lower lumbar spine demonstrate bilateral pedicle screw placement and interbody implants at L4-L5 and L5-S1. IMPRESSION: Posterior and interbody fusion underway at L4-L5 and L5-S1. FLUOROSCOPY TIME:  0 minutes 57 seconds Electronically Signed   By: Genevie Ann M.D.   On: 06/09/2019 18:51    Assessment/Plan: Postop day 1: The patient will likely go home later on today after she mobilizes.  I gave her her discharge instructions and answered all her questions.  LOS: 1 day     Ophelia Charter 06/10/2019, 8:07 AM

## 2019-06-10 NOTE — Discharge Summary (Signed)
Physician Discharge Summary    Providing Compassionate, Quality Care - Together   Patient ID: Paige DungLinda Barbour Geiger MRN: [id number] DOB/AGE: May 29, 1946 73 y.o.  Admit date: 06/09/2019 Discharge date: 06/10/2019  Admission Diagnoses: Spinal stenosis  Discharge Diagnoses:  Active Problems:   Spinal stenosis   Discharged Condition: good  Hospital Course: Patient was admitted June 09, 2019, following an L4-5 and L5-S1 posterior lumbar interbody fusion.  She did well postoperatively.  She has ambulated and her pain is well controlled.  She worked with physical and occupational therapies. She is ready to discharge home.  Consults: rehabilitation medicine  Significant Diagnostic Studies: radiology: Dg Lumbar Spine 2-3 Views  Result Date: 06/09/2019 CLINICAL DATA:  73 year old female undergoing lumbar surgery. EXAM: DG C-ARM 61-120 MIN; LUMBAR SPINE - 2-3 VIEW COMPARISON:  Intraoperative image at 1455 hours today. Lumbar MRI 09/08/2018. FINDINGS: Two intraoperative fluoroscopic spot views of the lower lumbar spine demonstrate bilateral pedicle screw placement and interbody implants at L4-L5 and L5-S1. IMPRESSION: Posterior and interbody fusion underway at L4-L5 and L5-S1. FLUOROSCOPY TIME:  0 minutes 57 seconds Electronically Signed   By: Odessa FlemingH  Hall M.D.   On: 06/09/2019 18:51   Dg Lumbar Spine 1 View  Result Date: 06/09/2019 CLINICAL DATA:  73 year old female undergoing lumbar surgery. EXAM: LUMBAR SPINE - 1 VIEW COMPARISON:  [place] [facility] lumbar MRI 09/08/2018. FINDINGS: Intraoperative portable cross-table lateral view of the lumbar spine labeled #1 at 1455 hours. Stable spondylolisthesis, most pronounced at L4-L5. A surgical probe is directed toward the superior S1 endplate/L5-S1 disc level. IMPRESSION: Intraoperative localization at L5-S1. Electronically Signed   By: Odessa FlemingH  Hall M.D.   On: 06/09/2019 18:49   Dg C-arm 1-60 Min  Result Date: 06/09/2019 CLINICAL DATA:  73 year old female  undergoing lumbar surgery. EXAM: DG C-ARM 61-120 MIN; LUMBAR SPINE - 2-3 VIEW COMPARISON:  Intraoperative image at 1455 hours today. Lumbar MRI 09/08/2018. FINDINGS: Two intraoperative fluoroscopic spot views of the lower lumbar spine demonstrate bilateral pedicle screw placement and interbody implants at L4-L5 and L5-S1. IMPRESSION: Posterior and interbody fusion underway at L4-L5 and L5-S1. FLUOROSCOPY TIME:  0 minutes 57 seconds Electronically Signed   By: Odessa FlemingH  Hall M.D.   On: 06/09/2019 18:51    Treatments: surgery: Redo right L4-5 and L5-S1Laminotomy/foraminotomies/medial facetectomy to decompress the right L4, L5 and S1nerve roots(the work required to do this was in addition to the work required to do the posterior lumbar interbody fusion because of the patient's spinal stenosis, facet arthropathy. Etc. requiring a wide decompression of the nerve roots.); right L4-5 and L5-S1 transforaminal lumbar interbody fusion with local morselized autograft bone, bone morphogenic protein soaked collagen sponges, and Zimmer DBM; insertion of interbody prosthesis at L4-5 and L5-S1(globus peek expandable interbody prosthesis); posterior  Segmental instrumentation from L4 to S1with globus titanium pedicle screws and rods; posterior lateral arthrodesis at L4-5 and L5-S1with local morselized autograft bone, bone morphogenic protein soaked collagen sponges and Zimmer DBM.  Discharge Exam: Blood pressure 115/64, pulse 85, temperature 98.8 F (37.1 C), temperature source Oral, resp. rate 16, height 5\' 2"  (1.575 m), weight 77.8 kg, SpO2 99 %.   Alert and oriented x 4 MAE PERRLA Incision is clean, dry, and intact; Honeycomb dressing in place  Disposition: Discharge disposition: 01-Home or Self Care        Allergies as of 06/10/2019   No Known Allergies     Medication List    TAKE these medications   albuterol 108 (90 Base) MCG/ACT inhaler Commonly known as: VENTOLIN HFA  Inhale 2 puffs into the lungs every  4 (four) hours as needed. For wheezing.   aspirin EC 81 MG tablet Take 81 mg by mouth every 6 (six) hours as needed for moderate pain.   atorvastatin 40 MG tablet Commonly known as: LIPITOR Take 40 mg by mouth daily.   chlorpheniramine 4 MG tablet Commonly known as: CHLOR-TRIMETON Take 4 mg by mouth 2 (two) times daily as needed for allergies.   cyclobenzaprine 10 MG tablet Commonly known as: FLEXERIL Take 1 tablet (10 mg total) by mouth 3 (three) times daily as needed for muscle spasms.   diclofenac sodium 1 % Gel Commonly known as: VOLTAREN Apply 2 g topically 3 (three) times daily as needed (joint pain).   docusate sodium 100 MG capsule Commonly known as: COLACE Take 1 capsule (100 mg total) by mouth 2 (two) times daily.   Excedrin Extra Strength 250-250-65 MG tablet Generic drug: aspirin-acetaminophen-caffeine Take 2 tablets by mouth every 6 (six) hours as needed for headache or migraine.   fluticasone 50 MCG/ACT nasal spray Commonly known as: FLONASE Place 2 sprays into both nostrils daily as needed for allergies or rhinitis.   ipratropium 0.06 % nasal spray Commonly known as: ATROVENT Place 2 sprays into both nostrils 2 (two) times a day.   multivitamin tablet Take 1 tablet by mouth daily.   oxyCODONE 5 MG immediate release tablet Commonly known as: Oxy IR/ROXICODONE Take 1 tablet (5 mg total) by mouth every 4 (four) hours as needed for moderate pain ((score 4 to 6)). What changed:   how much to take  reasons to take this   SUMAtriptan 50 MG tablet Commonly known as: IMITREX Take 50 mg by mouth every 2 (two) hours as needed for migraine.   triamterene-hydrochlorothiazide 37.5-25 MG tablet Commonly known as: MAXZIDE-25 Take 1 tablet by mouth daily.   vitamin C 1000 MG tablet Take 1,000 mg by mouth daily.            Durable Medical Equipment  (From admission, onward)         Start     Ordered   06/10/19 1218  For home use only DME 3 n 1  Once      06/10/19 1218         Follow-up Information    Newman Pies, MD. Schedule an appointment as soon as possible for a visit in 2 week(s).   Specialty: Neurosurgery Contact information: [address] [place] [address] [place] [address] [phone]           Signed: Patricia Nettle 06/10/2019, 12:22 PM

## 2019-06-15 MED FILL — Sodium Chloride IV Soln 0.9%: INTRAVENOUS | Qty: 1000 | Status: AC

## 2019-06-15 MED FILL — Heparin Sodium (Porcine) Inj 1000 Unit/ML: INTRAMUSCULAR | Qty: 30 | Status: AC

## 2019-06-24 ENCOUNTER — Other Ambulatory Visit: Payer: Self-pay

## 2019-06-24 ENCOUNTER — Ambulatory Visit
Admission: RE | Admit: 2019-06-24 | Discharge: 2019-06-24 | Disposition: A | Discharge status: Home / Self Care | Payer: Medicare Other | Source: Ambulatory Visit | Attending: Family Medicine | Admitting: Family Medicine

## 2019-06-24 DIAGNOSIS — Z1231 Encounter for screening mammogram for malignant neoplasm of breast: Secondary | ICD-10-CM | POA: Insufficient documentation

## 2019-07-08 ENCOUNTER — Emergency Department: Payer: Medicare Other

## 2019-07-08 ENCOUNTER — Emergency Department
Admission: EM | Admit: 2019-07-08 | Discharge: 2019-07-08 | Disposition: A | Discharge status: Home / Self Care | Payer: Medicare Other | Attending: Student in an Organized Health Care Education/Training Program | Admitting: Student in an Organized Health Care Education/Training Program

## 2019-07-08 ENCOUNTER — Other Ambulatory Visit: Payer: Self-pay

## 2019-07-08 ENCOUNTER — Encounter: Payer: Self-pay | Admitting: Emergency Medicine

## 2019-07-08 DIAGNOSIS — Y93K9 Activity, other involving animal care: Secondary | ICD-10-CM | POA: Diagnosis not present

## 2019-07-08 DIAGNOSIS — Z96652 Presence of left artificial knee joint: Secondary | ICD-10-CM | POA: Insufficient documentation

## 2019-07-08 DIAGNOSIS — Z96612 Presence of left artificial shoulder joint: Secondary | ICD-10-CM | POA: Diagnosis not present

## 2019-07-08 DIAGNOSIS — Z79899 Other long term (current) drug therapy: Secondary | ICD-10-CM | POA: Diagnosis not present

## 2019-07-08 DIAGNOSIS — S61452A Open bite of left hand, initial encounter: Secondary | ICD-10-CM

## 2019-07-08 DIAGNOSIS — Z96651 Presence of right artificial knee joint: Secondary | ICD-10-CM | POA: Insufficient documentation

## 2019-07-08 DIAGNOSIS — S61255A Open bite of left ring finger without damage to nail, initial encounter: Secondary | ICD-10-CM | POA: Diagnosis not present

## 2019-07-08 DIAGNOSIS — Z96611 Presence of right artificial shoulder joint: Secondary | ICD-10-CM | POA: Insufficient documentation

## 2019-07-08 DIAGNOSIS — J45909 Unspecified asthma, uncomplicated: Secondary | ICD-10-CM | POA: Insufficient documentation

## 2019-07-08 DIAGNOSIS — Y998 Other external cause status: Secondary | ICD-10-CM | POA: Diagnosis not present

## 2019-07-08 DIAGNOSIS — Z87891 Personal history of nicotine dependence: Secondary | ICD-10-CM | POA: Diagnosis not present

## 2019-07-08 DIAGNOSIS — Z23 Encounter for immunization: Secondary | ICD-10-CM | POA: Diagnosis not present

## 2019-07-08 DIAGNOSIS — Y92019 Unspecified place in single-family (private) house as the place of occurrence of the external cause: Secondary | ICD-10-CM | POA: Insufficient documentation

## 2019-07-08 DIAGNOSIS — W540XXA Bitten by dog, initial encounter: Secondary | ICD-10-CM | POA: Insufficient documentation

## 2019-07-08 DIAGNOSIS — Z7982 Long term (current) use of aspirin: Secondary | ICD-10-CM | POA: Diagnosis not present

## 2019-07-08 DIAGNOSIS — I10 Essential (primary) hypertension: Secondary | ICD-10-CM | POA: Insufficient documentation

## 2019-07-08 MED ORDER — LIDOCAINE HCL (PF) 1 % IJ SOLN
5.0000 mL | Freq: Once | INTRAMUSCULAR | Status: AC
  Administered 2019-07-08: 21:00:00 5 mL
  Filled 2019-07-08: qty 5

## 2019-07-08 MED ORDER — TETANUS-DIPHTH-ACELL PERTUSSIS 5-2.5-18.5 LF-MCG/0.5 IM SUSP
0.5000 mL | Freq: Once | INTRAMUSCULAR | Status: AC
  Administered 2019-07-08: 21:00:00 0.5 mL via INTRAMUSCULAR
  Filled 2019-07-08: qty 0.5

## 2019-07-08 MED ORDER — AMOXICILLIN-POT CLAVULANATE 875-125 MG PO TABS
1.0000 | ORAL_TABLET | Freq: Once | ORAL | Status: AC
  Administered 2019-07-08: 21:00:00 1 via ORAL
  Filled 2019-07-08: qty 1

## 2019-07-08 MED ORDER — AMOXICILLIN-POT CLAVULANATE 875-125 MG PO TABS: 1.0000 | ORAL_TABLET | Freq: Two times a day (BID) | ORAL | 0 refills | Status: DC

## 2019-07-08 NOTE — ED Notes (Signed)
Patient transported to X-ray at this time 

## 2019-07-08 NOTE — ED Triage Notes (Signed)
PT arrives post dog bite to left ring finger. Dog was pt's pet and fully vaccinated. Puncture/laceration to inner portion of finger.

## 2019-07-08 NOTE — ED Notes (Signed)
[place] Control notified at this time.

## 2019-07-08 NOTE — Discharge Instructions (Addendum)
You have been treated for an accidental dog bite to the finger. Keep the wound clean, dry, and covered. Take the antibiotic daily as directed. Follow-up with your provider for interim wound check. Have the sutures removed in 10-14 days.

## 2019-07-08 NOTE — ED Provider Notes (Addendum)
[facility] Emergency Department Provider Note ____________________________________________  Time seen: 2044  I have reviewed the triage vital signs and the nursing notes.  HISTORY  Chief Complaint  Animal Bite  HPI Paige Higgins is a 73 y.o. female presents to the ED for evaluation of accidental dog bite to the left ring finger.  Patient was at home, when her 2 large dogs got into an altercation over a bone.  Patient attempted to reach and to separate the dogs, and sustained a laceration to the palmar aspect of her left ring finger.  Patient denies any other injury at this time, and reports an unknown tetanus status.  Patient's dogs or healthy and up-to-date on their rabies vaccines.  Past Medical History:  Diagnosis Date  . Allergies   . Arthritis   . Asthma   . Depression   . Headache(784.0)   . Heart murmur   . Hyperlipidemia   . Hypertension   . PONV (postoperative nausea and vomiting)   . Recurrent UTI   . Sleep apnea    does not wear CPAP  . Spondylolisthesis of lumbar region   . Wears contact lenses     Patient Active Problem List   Diagnosis Date Noted  . Spinal stenosis 06/09/2019  . Status post reverse total shoulder replacement, right 03/10/2018  . Synovial cyst of lumbar facet joint 04/28/2014  . Primary localized osteoarthrosis, shoulder region 07/09/2012    Past Surgical History:  Procedure Laterality Date  . ABDOMINAL HYSTERECTOMY    . ARTHROPLASTY  07/07/2012   LEFT SHOULDER  . BREAST BIOPSY Left 2000  . CARDIAC CATHETERIZATION    . JOINT REPLACEMENT     bilateral knee replacements  . LUMBAR LAMINECTOMY/DECOMPRESSION MICRODISCECTOMY N/A 04/28/2014   Procedure: LUMBAR THREE TO FOUR, LUMBAR FOUR TO FIVE  LAMINECTOMY/DECOMPRESSION MICRODISCECTOMY 2 LEVELS;  Surgeon: Cristi LoronJeffrey D Jenkins, MD;  Location: [facility];  Service: Neurosurgery;  Laterality: N/A;  L34 L45 laminectomies  . REVERSE SHOULDER ARTHROPLASTY  07/07/2012    Procedure: REVERSE SHOULDER ARTHROPLASTY;  Surgeon: Mable ParisJustin William Chandler, MD;  [facility];  Service: Orthopedics;  Laterality: Left;  . REVERSE SHOULDER ARTHROPLASTY Right 03/10/2018   Procedure: REVERSE SHOULDER ARTHROPLASTY;  Surgeon: Christena FlakePoggi, John J, MD;  Location: [facility] ORS;  Service: Orthopedics;  Laterality: Right;  . SHOULDER ARTHROSCOPY     right shoulder    Prior to Admission medications   Medication Sig Start Date End Date Taking? Authorizing Provider  albuterol (PROVENTIL HFA;VENTOLIN HFA) 108 (90 BASE) MCG/ACT inhaler Inhale 2 puffs into the lungs every 4 (four) hours as needed. For wheezing.    [provider]  amoxicillin-clavulanate (AUGMENTIN) 875-125 MG tablet Take 1 tablet by mouth 2 (two) times daily. 07/08/19   Emina Ribaudo, Charlesetta IvoryJenise V Bacon, PA-C  Ascorbic Acid (VITAMIN C) 1000 MG tablet Take 1,000 mg by mouth daily.    [provider]  aspirin EC 81 MG tablet Take 81 mg by mouth every 6 (six) hours as needed for moderate pain.     [provider]  aspirin-acetaminophen-caffeine (EXCEDRIN EXTRA STRENGTH) 951 888 4048250-250-65 MG tablet Take 2 tablets by mouth every 6 (six) hours as needed for headache or migraine.     [provider]  atorvastatin (LIPITOR) 40 MG tablet Take 40 mg by mouth daily.    [provider]  chlorpheniramine (CHLOR-TRIMETON) 4 MG tablet Take 4 mg by mouth 2 (two) times daily as needed for allergies.    [provider]  cyclobenzaprine (FLEXERIL)  10 MG tablet Take 1 tablet (10 mg total) by mouth 3 (three) times daily as needed for muscle spasms. 06/10/19   Val EagleBergman, Meghan D, NP  diclofenac sodium (VOLTAREN) 1 % GEL Apply 2 g topically 3 (three) times daily as needed (joint pain).     [provider]  docusate sodium (COLACE) 100 MG capsule Take 1 capsule (100 mg total) by mouth 2 (two) times daily. 06/10/19   Val EagleBergman, Meghan D, NP  fluticasone (FLONASE) 50 MCG/ACT nasal spray Place 2 sprays into both nostrils  daily as needed for allergies or rhinitis.    [provider]  ipratropium (ATROVENT) 0.06 % nasal spray Place 2 sprays into both nostrils 2 (two) times a day. 05/06/18   [provider]  Multiple Vitamin (MULTIVITAMIN) tablet Take 1 tablet by mouth daily.    [provider]  oxyCODONE (OXY IR/ROXICODONE) 5 MG immediate release tablet Take 1 tablet (5 mg total) by mouth every 4 (four) hours as needed for moderate pain ((score 4 to 6)). 06/10/19   Val EagleBergman, Meghan D, NP  SUMAtriptan (IMITREX) 50 MG tablet Take 50 mg by mouth every 2 (two) hours as needed for migraine.     [provider]  triamterene-hydrochlorothiazide (MAXZIDE-25) 37.5-25 MG per tablet Take 1 tablet by mouth daily.    [provider]    Allergies Patient has no known allergies.  Family History  Problem Relation Age of Onset  . Breast cancer Neg Hx     Social History Social History   Tobacco Use  . Smoking status: Former Smoker    Packs/day: 0.50    Years: 8.00    Pack years: 4.00    Types: Cigarettes    Quit date: 01/08/1986    Years since quitting: 33.5  . Smokeless tobacco: Never Used  Substance Use Topics  . Alcohol use: No  . Drug use: No    Review of Systems  Constitutional: Negative for fever. Eyes: Negative for visual changes. ENT: Negative for sore throat. Cardiovascular: Negative for chest pain. Respiratory: Negative for shortness of breath. Gastrointestinal: Negative for abdominal pain, vomiting and diarrhea. Genitourinary: Negative for dysuria. Musculoskeletal: Negative for back pain. Skin: Negative for rash.  Left ring finger laceration as above. Neurological: Negative for headaches, focal weakness or numbness. ____________________________________________  PHYSICAL EXAM:  VITAL SIGNS: ED Triage Vitals  Enc Vitals Group     BP 07/08/19 2025 (!) 152/83     Pulse Rate 07/08/19 2025 94     Resp 07/08/19 2025 18     Temp 07/08/19 2025 98.7 F (37.1  C)     Temp Source 07/08/19 2025 Oral     SpO2 07/08/19 2025 97 %     Weight 07/08/19 2027 165 lb (74.8 kg)     Height 07/08/19 2027 5\' 2"  (1.575 m)     Head Circumference --      Peak Flow --      Pain Score 07/08/19 2027 9     Pain Loc --      Pain Edu? --      Excl. in GC? --     Constitutional: Alert and oriented. Well appearing and in no distress. Head: Normocephalic and atraumatic. Eyes: Conjunctivae are normal. Normal extraocular movements Cardiovascular: Normal rate, regular rhythm. Normal distal pulses. Respiratory: Normal respiratory effort.  Musculoskeletal: Nontender with normal range of motion in all extremities.  Neurologic:  Normal gait without ataxia. Normal speech and language. No gross focal neurologic deficits are appreciated.  Skin:  Skin is warm, dry and intact. No rash noted. Psychiatric: Mood and affect are normal. Patient exhibits appropriate insight and judgment. ____________________________________________   RADIOLOGY  DG Left Ring Finger negative ____________________________________________  PROCEDURES Tdap 0.5 ml IM Augmentin 875 mg PO  .Marland KitchenLaceration Repair  Date/Time: 07/08/2019 9:08 PM Performed by: Melvenia Needles, PA-C Authorized by: Melvenia Needles, PA-C   Consent:    Consent obtained:  Verbal   Consent given by:  Patient   Risks discussed:  Pain, poor wound healing and infection   Alternatives discussed:  Delayed treatment Anesthesia (see MAR for exact dosages):    Anesthesia method: transthecal block. Laceration details:    Location:  Finger   Finger location:  L ring finger   Wound length (cm): 2x2 cm V-shaped lac.   Depth (mm):  3 Repair type:    Repair type:  Simple Pre-procedure details:    Preparation:  Patient was prepped and draped in usual sterile fashion Exploration:    Wound exploration: wound explored through full range of motion     Contaminated: no   Treatment:    Area cleansed with:  Betadine  and saline   Amount of cleaning:  Extensive   Irrigation solution:  Sterile saline   Irrigation method:  Syringe Skin repair:    Repair method:  Sutures   Suture size:  4-0   Suture material:  Nylon   Suture technique:  Simple interrupted   Number of sutures:  6 Approximation:    Approximation:  Loose Post-procedure details:    Dressing:  Non-adherent dressing, bulky dressing and splint for protection   Patient tolerance of procedure:  Tolerated well, no immediate complications  ____________________________________________  INITIAL IMPRESSION / ASSESSMENT AND PLAN / ED COURSE  Lahari Suttles was evaluated in Emergency Department on 07/08/2019 for the symptoms described in the history of present illness. She was evaluated in the context of the global COVID-19 pandemic, which necessitated consideration that the patient might be at risk for infection with the SARS-CoV-2 virus that causes COVID-19. Institutional protocols and algorithms that pertain to the evaluation of patients at risk for COVID-19 are in a state of rapid change based on information released by regulatory bodies including the CDC and federal and state organizations. These policies and algorithms were followed during the patient's care in the ED.  Patient with ED evaluation of an accidental dog bite to the left ring finger.  Patient presents with a large irregular flap to the middle phalanx palmar aspect.  The dog bite wound was closed loosely with sutures.  Wound care instructions and supplies are provided.  Patient is discharged with Augmentin and her tetanus is updated at this time. ____________________________________________  FINAL CLINICAL IMPRESSION(S) / ED DIAGNOSES  Final diagnoses:  Dog bite of left hand, initial encounter      Melvenia Needles, PA-C 07/08/19 2154    Melvenia Needles, PA-C 07/08/19 2154    Merlyn Lot, MD 07/09/19 1431

## 2020-02-01 ENCOUNTER — Other Ambulatory Visit: Payer: Medicare Other

## 2020-02-02 ENCOUNTER — Other Ambulatory Visit
Admission: RE | Admit: 2020-02-02 | Discharge: 2020-02-02 | Disposition: A | Discharge status: Home / Self Care | Payer: Medicare Other | Source: Ambulatory Visit | Attending: Internal Medicine | Admitting: Internal Medicine

## 2020-02-02 DIAGNOSIS — Z01812 Encounter for preprocedural laboratory examination: Secondary | ICD-10-CM | POA: Diagnosis present

## 2020-02-02 DIAGNOSIS — Z20822 Contact with and (suspected) exposure to covid-19: Secondary | ICD-10-CM | POA: Insufficient documentation

## 2020-02-02 LAB — SARS CORONAVIRUS 2 (TAT 6-24 HRS): SARS Coronavirus 2: NEGATIVE

## 2020-02-03 ENCOUNTER — Ambulatory Visit: Payer: Medicare Other | Admitting: Certified Registered Nurse Anesthetist

## 2020-02-03 ENCOUNTER — Encounter: Payer: Self-pay | Admitting: Internal Medicine

## 2020-02-03 ENCOUNTER — Encounter
Admission: RE | Disposition: A | Discharge status: Home / Self Care | Payer: Self-pay | Source: Home / Self Care | Attending: Internal Medicine

## 2020-02-03 ENCOUNTER — Other Ambulatory Visit: Payer: Self-pay

## 2020-02-03 ENCOUNTER — Ambulatory Visit
Admission: RE | Admit: 2020-02-03 | Discharge: 2020-02-03 | Disposition: A | Discharge status: Home / Self Care | Payer: Medicare Other | Attending: Internal Medicine | Admitting: Internal Medicine

## 2020-02-03 DIAGNOSIS — Z86010 Personal history of colonic polyps: Secondary | ICD-10-CM | POA: Diagnosis not present

## 2020-02-03 DIAGNOSIS — M4316 Spondylolisthesis, lumbar region: Secondary | ICD-10-CM | POA: Diagnosis not present

## 2020-02-03 DIAGNOSIS — I10 Essential (primary) hypertension: Secondary | ICD-10-CM | POA: Insufficient documentation

## 2020-02-03 DIAGNOSIS — K591 Functional diarrhea: Secondary | ICD-10-CM | POA: Diagnosis not present

## 2020-02-03 DIAGNOSIS — J45909 Unspecified asthma, uncomplicated: Secondary | ICD-10-CM | POA: Diagnosis not present

## 2020-02-03 DIAGNOSIS — K640 First degree hemorrhoids: Secondary | ICD-10-CM | POA: Insufficient documentation

## 2020-02-03 DIAGNOSIS — R011 Cardiac murmur, unspecified: Secondary | ICD-10-CM | POA: Diagnosis not present

## 2020-02-03 DIAGNOSIS — M1990 Unspecified osteoarthritis, unspecified site: Secondary | ICD-10-CM | POA: Insufficient documentation

## 2020-02-03 DIAGNOSIS — E785 Hyperlipidemia, unspecified: Secondary | ICD-10-CM | POA: Insufficient documentation

## 2020-02-03 DIAGNOSIS — R519 Headache, unspecified: Secondary | ICD-10-CM | POA: Insufficient documentation

## 2020-02-03 DIAGNOSIS — G4730 Sleep apnea, unspecified: Secondary | ICD-10-CM | POA: Insufficient documentation

## 2020-02-03 DIAGNOSIS — K5730 Diverticulosis of large intestine without perforation or abscess without bleeding: Secondary | ICD-10-CM | POA: Diagnosis not present

## 2020-02-03 DIAGNOSIS — F329 Major depressive disorder, single episode, unspecified: Secondary | ICD-10-CM | POA: Insufficient documentation

## 2020-02-03 DIAGNOSIS — Z7982 Long term (current) use of aspirin: Secondary | ICD-10-CM | POA: Insufficient documentation

## 2020-02-03 DIAGNOSIS — Z96611 Presence of right artificial shoulder joint: Secondary | ICD-10-CM | POA: Insufficient documentation

## 2020-02-03 DIAGNOSIS — Z79899 Other long term (current) drug therapy: Secondary | ICD-10-CM | POA: Diagnosis not present

## 2020-02-03 DIAGNOSIS — Z90710 Acquired absence of both cervix and uterus: Secondary | ICD-10-CM | POA: Insufficient documentation

## 2020-02-03 DIAGNOSIS — Z791 Long term (current) use of non-steroidal anti-inflammatories (NSAID): Secondary | ICD-10-CM | POA: Insufficient documentation

## 2020-02-03 DIAGNOSIS — Z96653 Presence of artificial knee joint, bilateral: Secondary | ICD-10-CM | POA: Diagnosis not present

## 2020-02-03 HISTORY — PX: COLONOSCOPY WITH PROPOFOL: SHX5780

## 2020-02-03 SURGERY — COLONOSCOPY WITH PROPOFOL
Anesthesia: General

## 2020-02-03 MED ORDER — LIDOCAINE HCL (CARDIAC) PF 100 MG/5ML IV SOSY
PREFILLED_SYRINGE | INTRAVENOUS | Status: DC | PRN
  Administered 2020-02-03: 60 mg via INTRAVENOUS

## 2020-02-03 MED ORDER — SODIUM CHLORIDE 0.9 % IV SOLN: INTRAVENOUS | Status: DC

## 2020-02-03 MED ORDER — PROPOFOL 500 MG/50ML IV EMUL
INTRAVENOUS | Status: DC | PRN
  Administered 2020-02-03: 100 ug/kg/min via INTRAVENOUS

## 2020-02-03 MED ORDER — ONDANSETRON HCL 4 MG/2ML IJ SOLN
INTRAMUSCULAR | Status: DC | PRN
  Administered 2020-02-03: 4 mg via INTRAVENOUS

## 2020-02-03 MED ORDER — PROPOFOL 10 MG/ML IV BOLUS
INTRAVENOUS | Status: AC
  Filled 2020-02-03: qty 40

## 2020-02-03 MED ORDER — DEXMEDETOMIDINE HCL 200 MCG/2ML IV SOLN
INTRAVENOUS | Status: DC | PRN
  Administered 2020-02-03: 8 ug via INTRAVENOUS
  Administered 2020-02-03: 4 ug via INTRAVENOUS

## 2020-02-03 NOTE — H&P (Signed)
Outpatient short stay form Pre-procedure 02/03/2020 11:43 AM Paige Higgins Paige Higgins, M.D.  Primary Physician:  Paige Higgins, M.D.  Reason for visit:  Chronic diarrhea  History of present illness:  74 y/o female with hx of adenomatous colon polyps presents with chronic diarrhea, mainly in the mornings, 2-3 per day. No bleeding or weight loss.     Current Facility-Administered Medications:  .  0.9 %  sodium chloride infusion, , Intravenous, Continuous, [place], Benay Pike, MD, Last Rate: 20 mL/hr at 02/03/20 1015, New Bag at 02/03/20 1015  Medications Prior to Admission  Medication Sig Dispense Refill Last Dose  . Ascorbic Acid (VITAMIN C) 1000 MG tablet Take 1,000 mg by mouth daily.   Past Week at Unknown time  . aspirin-acetaminophen-caffeine (EXCEDRIN EXTRA STRENGTH) 250-250-65 MG tablet Take 2 tablets by mouth every 6 (six) hours as needed for headache or migraine.    02/02/2020 at Unknown time  . atorvastatin (LIPITOR) 40 MG tablet Take 40 mg by mouth daily.   02/02/2020 at Unknown time  . chlorpheniramine (CHLOR-TRIMETON) 4 MG tablet Take 4 mg by mouth 2 (two) times daily as needed for allergies.   Past Week at Unknown time  . fluticasone (FLONASE) 50 MCG/ACT nasal spray Place 2 sprays into both nostrils daily as needed for allergies or rhinitis.   Past Week at Unknown time  . ipratropium (ATROVENT) 0.06 % nasal spray Place 2 sprays into both nostrils 2 (two) times a day.   Past Week at Unknown time  . SUMAtriptan (IMITREX) 50 MG tablet Take 50 mg by mouth every 2 (two) hours as needed for migraine.    02/03/2020 at Unknown time  . triamterene-hydrochlorothiazide (MAXZIDE-25) 37.5-25 MG per tablet Take 1 tablet by mouth daily.   Past Week at Unknown time  . albuterol (PROVENTIL HFA;VENTOLIN HFA) 108 (90 BASE) MCG/ACT inhaler Inhale 2 puffs into the lungs every 4 (four) hours as needed. For wheezing.     Marland Kitchen amoxicillin-clavulanate (AUGMENTIN) 875-125 MG tablet Take 1 tablet by mouth 2 (two) times  daily. (Patient not taking: Reported on 02/03/2020) 20 tablet 0 Completed Course at Unknown time  . aspirin EC 81 MG tablet Take 81 mg by mouth every 6 (six) hours as needed for moderate pain.    01/29/2020  . cyclobenzaprine (FLEXERIL) 10 MG tablet Take 1 tablet (10 mg total) by mouth 3 (three) times daily as needed for muscle spasms. 30 tablet 0   . diclofenac sodium (VOLTAREN) 1 % GEL Apply 2 g topically 3 (three) times daily as needed (joint pain).      Marland Kitchen docusate sodium (COLACE) 100 MG capsule Take 1 capsule (100 mg total) by mouth 2 (two) times daily. 10 capsule 0   . Multiple Vitamin (MULTIVITAMIN) tablet Take 1 tablet by mouth daily.   01/29/2020  . oxyCODONE (OXY IR/ROXICODONE) 5 MG immediate release tablet Take 1 tablet (5 mg total) by mouth every 4 (four) hours as needed for moderate pain ((score 4 to 6)). 30 tablet 0      No Known Allergies   Past Medical History:  Diagnosis Date  . Allergies   . Arthritis   . Asthma   . Depression   . Headache(784.0)   . Heart murmur   . Hyperlipidemia   . Hypertension   . PONV (postoperative nausea and vomiting)   . Recurrent UTI   . Sleep apnea    does not wear CPAP  . Spondylolisthesis of lumbar region   . Wears contact lenses  Review of systems:  Otherwise negative.    Physical Exam  Gen: Alert, oriented. Appears stated age.  HEENT: [place]/AT. PERRLA. Lungs: CTA, no wheezes. CV: RR nl S1, S2. Abd: soft, benign, no masses. BS+ Ext: No edema. Pulses 2+    Planned procedures: Proceed with colonoscopy with biopsy. The patient understands the nature of the planned procedure, indications, risks, alternatives and potential complications including but not limited to bleeding, infection, perforation, damage to internal organs and possible oversedation/side effects from anesthesia. The patient agrees and gives consent to proceed.  Please refer to procedure notes for findings, recommendations and patient disposition/instructions.      Carmelle Bamberg K. [name], M.D. Gastroenterology 02/03/2020  11:43 AM

## 2020-02-03 NOTE — Anesthesia Postprocedure Evaluation (Signed)
Anesthesia Post Note  Patient: Nicolemarie Wooley  Procedure(s) Performed: COLONOSCOPY WITH PROPOFOL (N/A )  Patient location during evaluation: PACU Anesthesia Type: General Level of consciousness: awake and alert Pain management: pain level controlled Vital Signs Assessment: post-procedure vital signs reviewed and stable Respiratory status: spontaneous breathing, nonlabored ventilation and respiratory function stable Cardiovascular status: blood pressure returned to baseline and stable Postop Assessment: no apparent nausea or vomiting Anesthetic complications: no     Last Vitals:  Vitals:   02/03/20 1232 02/03/20 1242  BP: 122/64 116/66  Pulse: 88 74  Resp: 19 (!) 22  Temp:    SpO2: 96% 100%    Last Pain:  Vitals:   02/03/20 1242  TempSrc:   PainSc: 0-No pain                 Karleen Hampshire

## 2020-02-03 NOTE — Op Note (Addendum)
[facility] Patient Name: Paige Higgins Procedure Date: 02/03/2020 11:49 AM MRN: [id number] Account #: [id number] Date of Birth: 1945-12-29 Admit Type: Outpatient Age: 74 Room: [facility] ENDO ROOM 3 Gender: Female Note Status: Supervisor Override Procedure:             Colonoscopy Indications:           Functional diarrhea, Personal history of colonic polyps Providers:             Benay Pike. Alice Reichert MD, MD Referring MD:          Irven Easterly. Kary Kos, MD (Referring MD) Medicines:             Propofol per Anesthesia Complications:         No immediate complications. Procedure:             Pre-Anesthesia Assessment:                        - The risks and benefits of the procedure and the                         sedation options and risks were discussed with the                         patient. All questions were answered and informed                         consent was obtained.                        - Patient identification and proposed procedure were                         verified prior to the procedure by the nurse. The                         procedure was verified in the procedure room.                        - ASA Grade Assessment: III - A patient with severe                         systemic disease.                        - After reviewing the risks and benefits, the patient                         was deemed in satisfactory condition to undergo the                         procedure.                        After obtaining informed consent, the colonoscope was                         passed under direct vision. Throughout the procedure,                         the  patient's blood pressure, pulse, and oxygen                         saturations were monitored continuously. The                         Colonoscope was introduced through the anus and                         advanced to the the cecum, identified by appendiceal                         orifice  and ileocecal valve. The colonoscopy was                         performed without difficulty. The patient tolerated                         the procedure well. The quality of the bowel                         preparation was adequate. The ileocecal valve,                         appendiceal orifice, and rectum were photographed. Findings:      The perianal and digital rectal examinations were normal. Pertinent       negatives include normal sphincter tone and no palpable rectal lesions.      Non-bleeding internal hemorrhoids were found during retroflexion. The       hemorrhoids were Grade I (internal hemorrhoids that do not prolapse).      Many small-mouthed diverticula were found in the entire colon.      Normal mucosa was found in the entire colon. Biopsies for histology were       taken with a cold forceps from the random colon for evaluation of       microscopic colitis. Verification of patient identification for the       specimen was done by the nurse.      The exam was otherwise without abnormality on direct and retroflexion       views. Impression:            - Non-bleeding internal hemorrhoids.                        - Diverticulosis in the entire examined colon.                        - Normal mucosa in the entire examined colon. Biopsied.                        - The examination was otherwise normal on direct and                         retroflexion views. Recommendation:        - Patient has a contact number available for                         emergencies. The signs and symptoms of potential  delayed complications were discussed with the patient.                         Return to normal activities tomorrow. Written                         discharge instructions were provided to the patient.                        - Resume previous diet.                        - Continue present medications.                        - Await pathology results.                         - No repeat colonoscopy due to current age (32 years                         or older) and the absence of colonic polyps.                        - Return to physician assistant in 6 weeks.                        - Please follow up with Harmon Dun, PA-C in [ ]                          months. Procedure Code(s):     --- Professional ---                        [phone], Colonoscopy, flexible; with biopsy, single or                         multiple Diagnosis Code(s):     --- Professional ---                        K57.30, Diverticulosis of large intestine without                         perforation or abscess without bleeding                        K59.1, Functional diarrhea                        K64.0, First degree hemorrhoids CPT copyright 2019 American Medical Association. All rights reserved. The codes documented in this report are preliminary and upon coder review may  be revised to meet current compliance requirements. 2020 MD, MD 02/03/2020 12:12:05 PM This report has been signed electronically. Number of Addenda: 0 Note Initiated On: 02/03/2020 11:49 AM Scope Withdrawal Time: 0 hours 6 minutes 5 seconds  Total Procedure Duration: 0 hours 11 minutes 59 seconds  Estimated Blood Loss:  Estimated blood loss: none.      [facility]

## 2020-02-03 NOTE — Interval H&P Note (Signed)
History and Physical Interval Note:  02/03/2020 11:44 AM  Paige Higgins  has presented today for surgery, with the diagnosis of CHRONIC DIARRHEA P HX CP.  The various methods of treatment have been discussed with the patient and family. After consideration of risks, benefits and other options for treatment, the patient has consented to  Procedure(s): COLONOSCOPY WITH PROPOFOL (N/A) as a surgical intervention.  The patient's history has been reviewed, patient examined, no change in status, stable for surgery.  I have reviewed the patient's chart and labs.  Questions were answered to the patient's satisfaction.     [place], [place]

## 2020-02-03 NOTE — Anesthesia Preprocedure Evaluation (Addendum)
Anesthesia Evaluation  Patient identified by MRN, date of birth, ID band Patient awake    Reviewed: Allergy & Precautions, H&P , NPO status , Patient's Chart, lab work & pertinent test results  History of Anesthesia Complications (+) PONV  Airway Mallampati: II  TM Distance: >3 FB Neck ROM: full    Dental  (+) Teeth Intact, Implants   Pulmonary asthma , sleep apnea , neg recent URI, former smoker,           Cardiovascular hypertension, (-) angina(-) Past MI (-) dysrhythmias      Neuro/Psych  Headaches, PSYCHIATRIC DISORDERS Depression    GI/Hepatic negative GI ROS, Neg liver ROS,   Endo/Other  negative endocrine ROS  Renal/GU negative Renal ROS  negative genitourinary   Musculoskeletal   Abdominal   Peds  Hematology negative hematology ROS (+)   Anesthesia Other Findings Past Medical History: No date: Allergies No date: Arthritis No date: Asthma No date: Depression No date: Headache(784.0) No date: Heart murmur No date: Hyperlipidemia No date: Hypertension No date: PONV (postoperative nausea and vomiting) No date: Recurrent UTI No date: Sleep apnea     Comment:  does not wear CPAP No date: Spondylolisthesis of lumbar region No date: Wears contact lenses  Past Surgical History: No date: ABDOMINAL HYSTERECTOMY 07/07/2012: ARTHROPLASTY     Comment:  LEFT SHOULDER 2000: BREAST BIOPSY; Left No date: CARDIAC CATHETERIZATION No date: COLONOSCOPY No date: JOINT REPLACEMENT     Comment:  bilateral knee replacements 04/28/2014: LUMBAR LAMINECTOMY/DECOMPRESSION MICRODISCECTOMY; N/A     Comment:  Procedure: LUMBAR THREE TO FOUR, LUMBAR FOUR TO FIVE                LAMINECTOMY/DECOMPRESSION MICRODISCECTOMY 2 LEVELS;                Surgeon: Ophelia Charter, MD;  Location: [place] [facility];               Service: Neurosurgery;  Laterality: N/A;  L34 L45               laminectomies 07/07/2012: REVERSE SHOULDER  ARTHROPLASTY     Comment:  Procedure: REVERSE SHOULDER ARTHROPLASTY;  Surgeon:               Nita Sells, MD;  Location: [place];  Service:               Orthopedics;  Laterality: Left; 03/10/2018: REVERSE SHOULDER ARTHROPLASTY; Right     Comment:  Procedure: REVERSE SHOULDER ARTHROPLASTY;  Surgeon:               Corky Mull, MD;  Location: [facility] ORS;  Service:               Orthopedics;  Laterality: Right; No date: SHOULDER ARTHROSCOPY     Comment:  right shoulder  BMI    Body Mass Index: 31.18 kg/m      Reproductive/Obstetrics negative OB ROS                            Anesthesia Physical Anesthesia Plan  ASA: II  Anesthesia Plan: General   Post-op Pain Management:    Induction:   PONV Risk Score and Plan: Propofol infusion and TIVA  Airway Management Planned: Natural Airway and Nasal Cannula  Additional Equipment:   Intra-op Plan:   Post-operative Plan:   Informed Consent: I have reviewed the patients History and Physical, chart, labs and discussed the  procedure including the risks, benefits and alternatives for the proposed anesthesia with the patient or authorized representative who has indicated his/her understanding and acceptance.     Dental Advisory Given  Plan Discussed with: Anesthesiologist  Anesthesia Plan Comments:         Anesthesia Quick Evaluation

## 2020-02-03 NOTE — Transfer of Care (Signed)
Immediate Anesthesia Transfer of Care Note  Patient: Paige Higgins  Procedure(s) Performed: COLONOSCOPY WITH PROPOFOL (N/A )  Patient Location: PACU  Anesthesia Type:General  Level of Consciousness: awake, alert  and oriented  Airway & Oxygen Therapy: Patient Spontanous Breathing  Post-op Assessment: Report given to RN and Post -op Vital signs reviewed and stable  Post vital signs: Reviewed and stable  Last Vitals:  Vitals Value Taken Time  BP 106/59 02/03/20 1215  Temp 36.7 C 02/03/20 1212  Pulse 90 02/03/20 1216  Resp 20 02/03/20 1216  SpO2 97 % 02/03/20 1216  Vitals shown include unvalidated device data.  Last Pain:  Vitals:   02/03/20 1212  TempSrc: Temporal  PainSc: 0-No pain         Complications: No apparent anesthesia complications

## 2020-02-04 ENCOUNTER — Encounter: Payer: Self-pay | Admitting: *Deleted

## 2020-02-04 LAB — SURGICAL PATHOLOGY

## 2020-02-15 ENCOUNTER — Other Ambulatory Visit: Payer: Self-pay | Admitting: Neurosurgery

## 2020-02-15 DIAGNOSIS — M4722 Other spondylosis with radiculopathy, cervical region: Secondary | ICD-10-CM

## 2020-02-25 ENCOUNTER — Other Ambulatory Visit: Payer: Self-pay

## 2020-02-25 ENCOUNTER — Ambulatory Visit
Admission: RE | Admit: 2020-02-25 | Discharge: 2020-02-25 | Disposition: A | Discharge status: Home / Self Care | Payer: Medicare Other | Source: Ambulatory Visit | Attending: Neurosurgery | Admitting: Neurosurgery

## 2020-02-25 DIAGNOSIS — M4722 Other spondylosis with radiculopathy, cervical region: Secondary | ICD-10-CM | POA: Diagnosis not present

## 2020-06-30 ENCOUNTER — Other Ambulatory Visit: Payer: Self-pay | Admitting: Neurosurgery

## 2020-06-30 DIAGNOSIS — M4150 Other secondary scoliosis, site unspecified: Secondary | ICD-10-CM

## 2020-07-03 ENCOUNTER — Telehealth: Payer: Self-pay

## 2020-07-03 NOTE — Telephone Encounter (Signed)
Patient returned our call to review her medications and drug allergies before being scheduled for a lumbar myelogram.  She stated an understanding she will be here two hours, will need a driver, will need to be on strict bedrest for 24 hours after the procedure, and that she will need to hold Imitrex for 48 hours before and 24 hours after the myelogram.

## 2020-07-04 IMAGING — MG DIGITAL SCREENING BILATERAL MAMMOGRAM WITH TOMO AND CAD
8 series · 9 of 24 positions shown · non-contrast
Comparison: Previous exam(s).

CLINICAL DATA: Screening.

EXAM:
DIGITAL SCREENING BILATERAL MAMMOGRAM WITH TOMO AND CAD

[R MLO synth-2D]
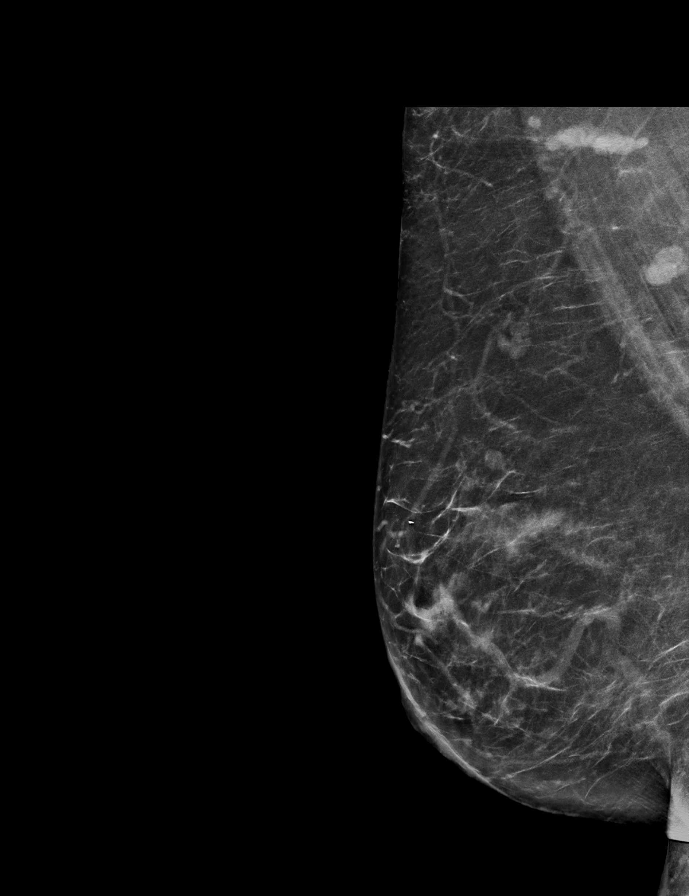

[L CC synth-2D]
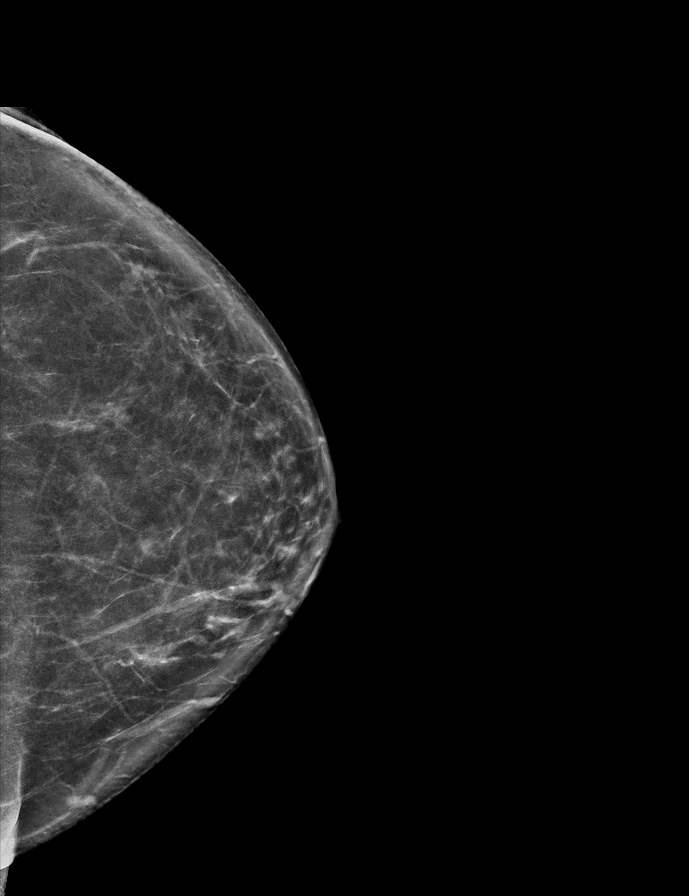

[L MLO synth-2D]
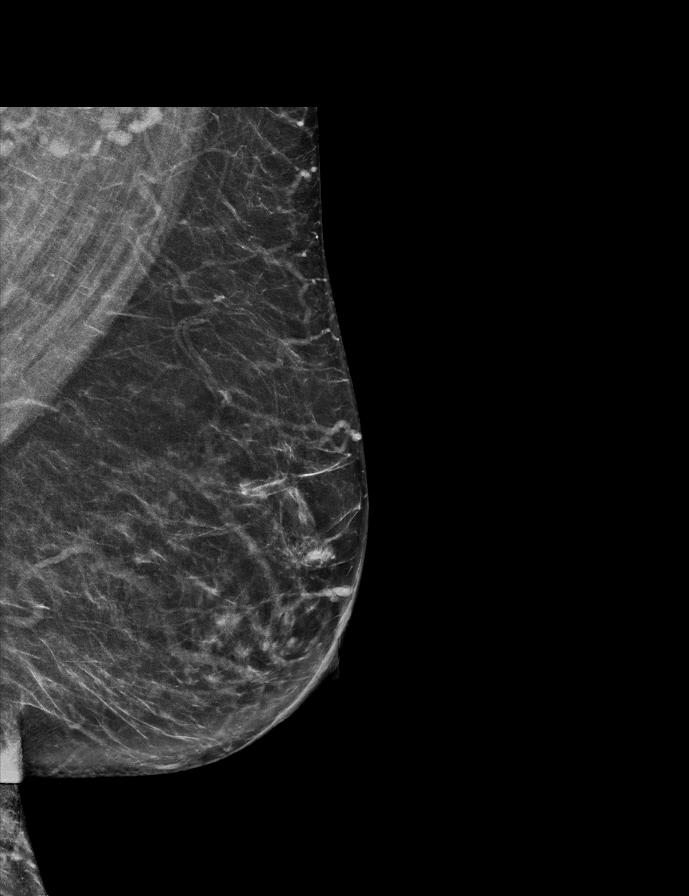

[R CC synth-2D]
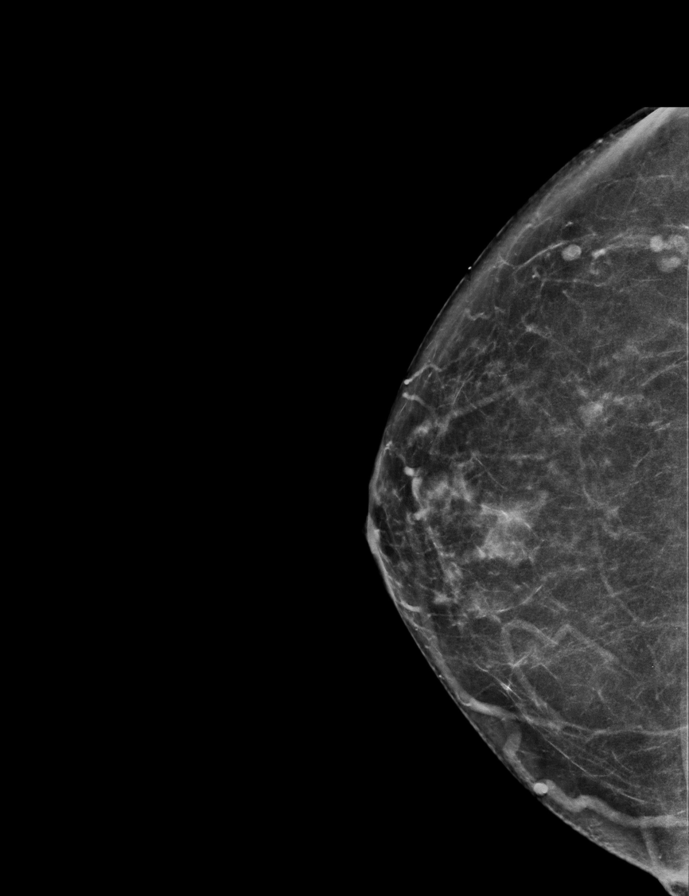

[R MLO tomo · 2 of 71 frames shown]
[frame 23/71]
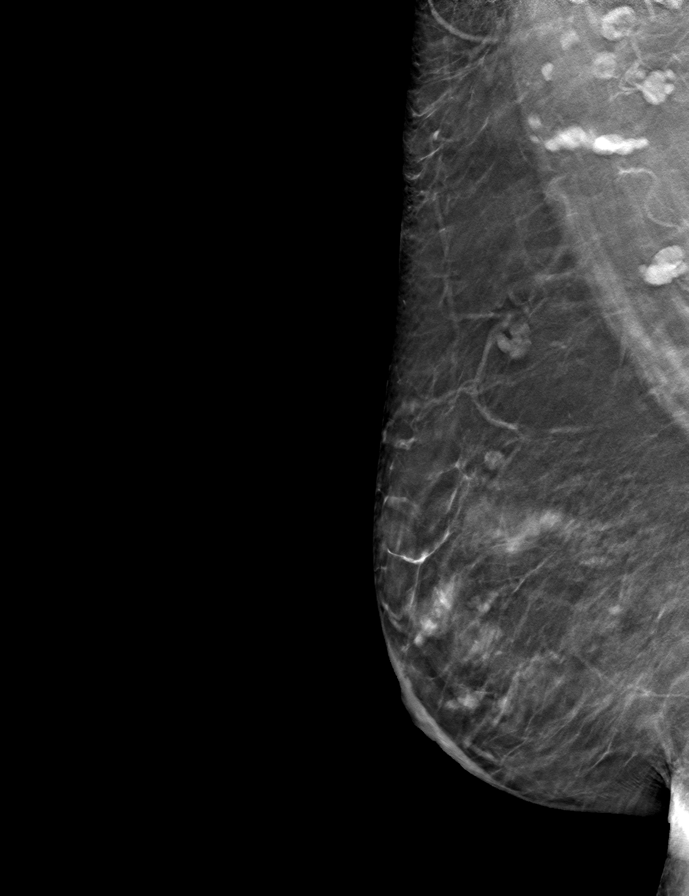
[frame 36/71]
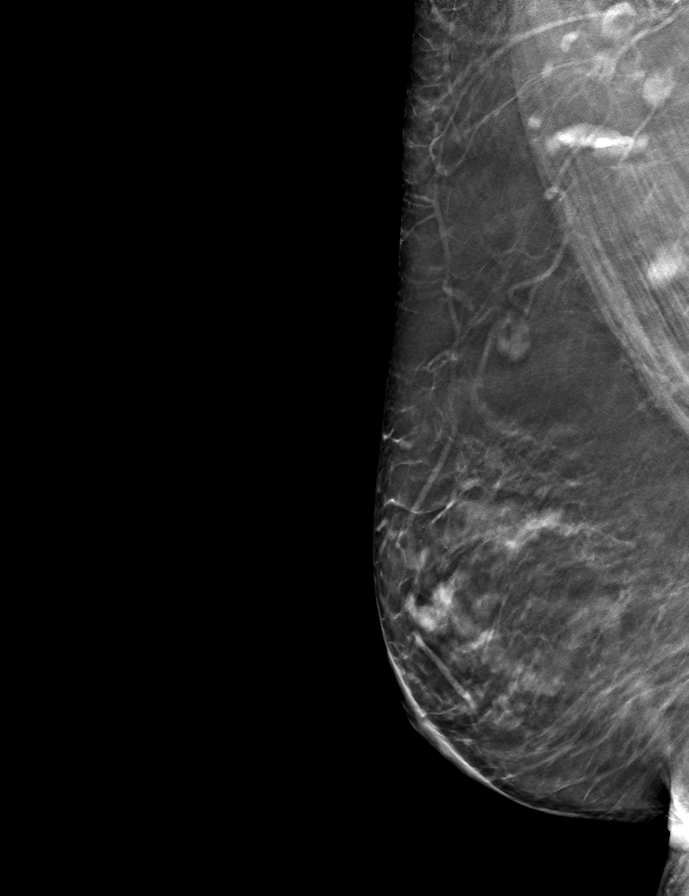

[L CC tomo · tomo slice 39/76.0]
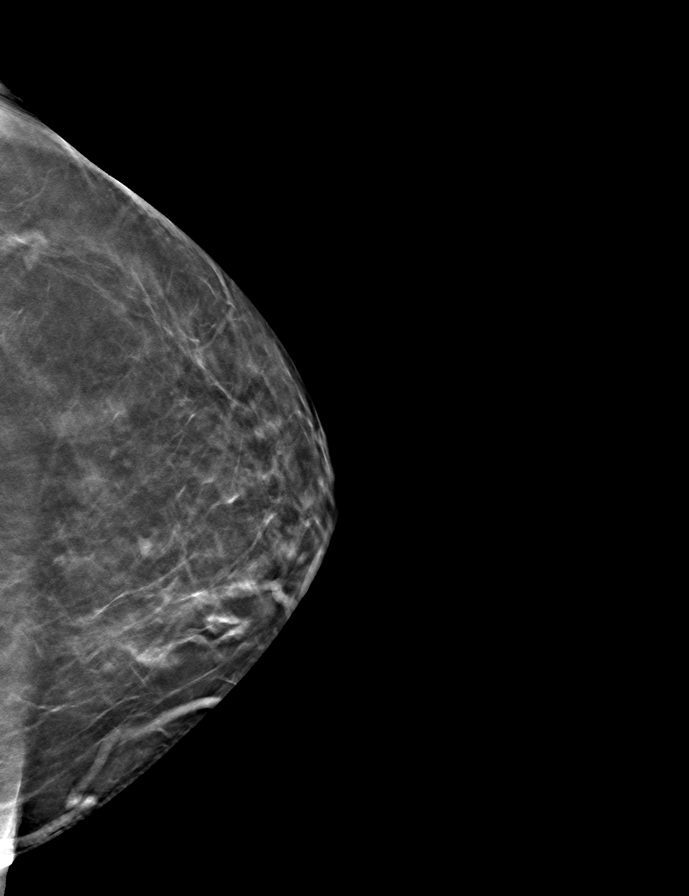

[L MLO tomo · tomo slice 35/70.0]
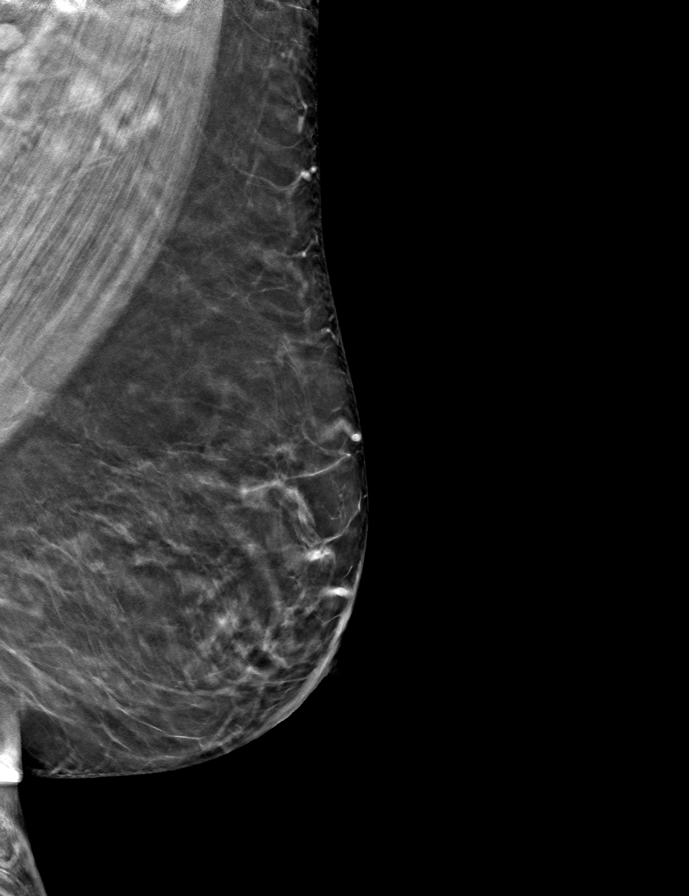

[R CC tomo · tomo slice 31/62.0]
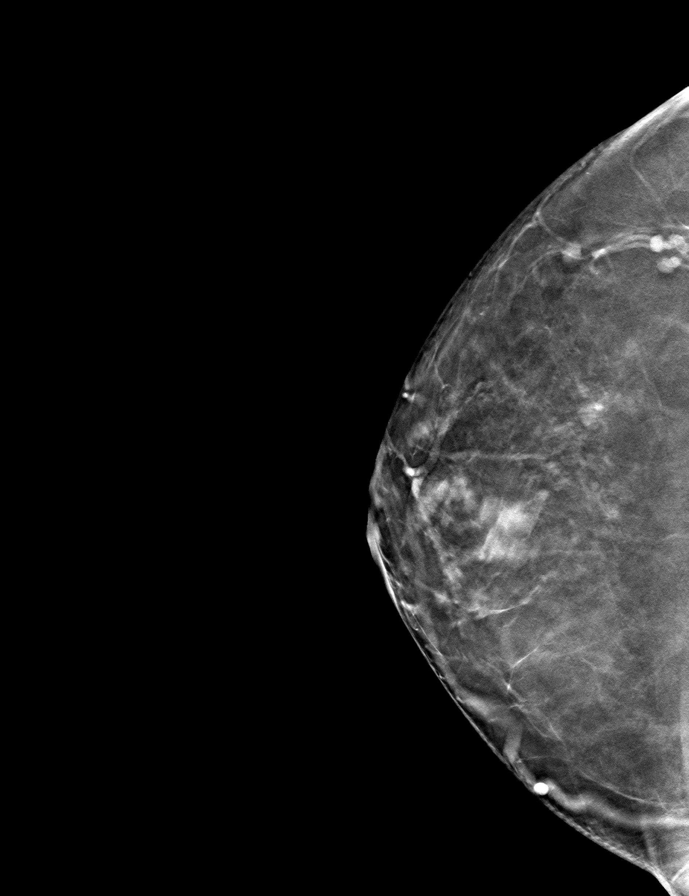

[9 of 24 positions shown; findings below may reference images not displayed]

ACR Breast Density Category b: There are scattered areas of
fibroglandular density.
FINDINGS: There are no findings suspicious for malignancy. Images were
processed with CAD.
IMPRESSION: No mammographic evidence of malignancy. A result letter of this
screening mammogram will be mailed directly to the patient.

RECOMMENDATION:
Screening mammogram in one year. (Code:CN-U-775)

BI-RADS CATEGORY  1: Negative.

## 2020-11-10 ENCOUNTER — Other Ambulatory Visit: Payer: Self-pay | Admitting: Neurosurgery

## 2020-11-10 DIAGNOSIS — M4316 Spondylolisthesis, lumbar region: Secondary | ICD-10-CM

## 2020-11-19 ENCOUNTER — Other Ambulatory Visit: Payer: Self-pay

## 2020-11-19 ENCOUNTER — Ambulatory Visit
Admission: RE | Admit: 2020-11-19 | Discharge: 2020-11-19 | Disposition: A | Discharge status: Home / Self Care | Payer: Medicare Other | Source: Ambulatory Visit | Attending: Neurosurgery | Admitting: Neurosurgery

## 2020-11-19 DIAGNOSIS — M4316 Spondylolisthesis, lumbar region: Secondary | ICD-10-CM | POA: Diagnosis present

## 2020-11-19 MED ORDER — GADOBUTROL 1 MMOL/ML IV SOLN
7.5000 mL | Freq: Once | INTRAVENOUS | Status: AC | PRN
  Administered 2020-11-19: 7.5 mL via INTRAVENOUS

## 2021-04-24 ENCOUNTER — Other Ambulatory Visit: Payer: Self-pay

## 2021-04-24 ENCOUNTER — Encounter: Payer: Self-pay | Admitting: Internal Medicine

## 2021-04-24 ENCOUNTER — Ambulatory Visit: Payer: Medicare Other | Admitting: Internal Medicine

## 2021-04-24 VITALS — BP 142/98 | HR 94 | Temp 98.4°F | Resp 16 | Ht 63.0 in | Wt 176.2 lb

## 2021-04-24 DIAGNOSIS — J309 Allergic rhinitis, unspecified: Secondary | ICD-10-CM

## 2021-04-24 DIAGNOSIS — J440 Chronic obstructive pulmonary disease with acute lower respiratory infection: Secondary | ICD-10-CM | POA: Diagnosis not present

## 2021-04-24 DIAGNOSIS — H1010 Acute atopic conjunctivitis, unspecified eye: Secondary | ICD-10-CM

## 2021-04-24 DIAGNOSIS — R0602 Shortness of breath: Secondary | ICD-10-CM

## 2021-04-24 DIAGNOSIS — J209 Acute bronchitis, unspecified: Secondary | ICD-10-CM

## 2021-04-24 MED ORDER — AZITHROMYCIN 250 MG PO TABS: ORAL_TABLET | ORAL | 0 refills | Status: DC

## 2021-04-24 MED ORDER — FLUCONAZOLE 150 MG PO TABS: ORAL_TABLET | ORAL | 0 refills | Status: DC

## 2021-04-24 MED ORDER — MONTELUKAST SODIUM 10 MG PO TABS: 10.0000 mg | ORAL_TABLET | Freq: Every day | ORAL | 3 refills | Status: DC

## 2021-04-24 NOTE — Progress Notes (Signed)
[facility] [place],  [address]  Internal MEDICINE  Office Visit Note  Patient Name: Paige Higgins  [id number]  [id number]  Date of Service: 04/24/2021   Complaints/HPI  Chief Complaint  Patient presents with  . Anxiety  . Asthma  . Depression  . Follow-up    Congestion green-yellow mucus that started 04/16/2021   HPI Pt is here for pulmonary evaluation of cough for the last 6 months to a year.  Her cough has been productive with severe chest congestion.  She also has wheezing off and on.  She has been also treated multiple times with antibiotics and steroids.  She admits to having shortness of breath and fatigue at times.  She denies any exposure to COVID and has been vaccinated with COVID vaccination she denies any fevers or chills. Has PND and nasal congestion  Patient admits to having allergies and symptoms are not well controlled on her recent medications Patient is also on albuterol and DuoNeb which she takes as needed.  She was started on Spiriva at one point currently she is not using Patient had 2 chest x-rays with no acute findings, she denies any cough at night but gets worse during the day and with exertion. She works from home and sits on a desk about 8 hours a day with couple of breaks in between over the last 4 weeks patient has seen that sputum has changed in color and now is green mucus and is hard to cough it up Patient has not had any PFTs in the past CT of the chest or any allergy testing, noted to have audible wheezing.  She gives a remote history of smoking and quit about 30 years ago Current Medication: Outpatient Encounter Medications as of 04/24/2021  Medication Sig  . albuterol (PROVENTIL HFA;VENTOLIN HFA) 108 (90 BASE) MCG/ACT inhaler Inhale 2 puffs into the lungs every 4 (four) hours as needed. For wheezing.  . Ascorbic Acid (VITAMIN C) 1000 MG tablet Take 1,000 mg by mouth daily.  Marland Kitchen aspirin EC 81 MG tablet Take 81 mg by  mouth every 6 (six) hours as needed for moderate pain.   Marland Kitchen aspirin-acetaminophen-caffeine (EXCEDRIN MIGRAINE) 250-250-65 MG tablet Take 2 tablets by mouth every 6 (six) hours as needed for headache or migraine.   Marland Kitchen atorvastatin (LIPITOR) 40 MG tablet Take 40 mg by mouth daily.  Marland Kitchen azithromycin (ZITHROMAX) 250 MG tablet Take one tab a day for 10 days for uri  . chlorpheniramine (CHLOR-TRIMETON) 4 MG tablet Take 4 mg by mouth 2 (two) times daily as needed for allergies.  Marland Kitchen diclofenac sodium (VOLTAREN) 1 % GEL Apply 2 g topically 3 (three) times daily as needed (joint pain).   . fluconazole (DIFLUCAN) 150 MG tablet Take one tab as needed for 2 days  . fluticasone (FLONASE) 50 MCG/ACT nasal spray Place 2 sprays into both nostrils daily as needed for allergies or rhinitis.  Marland Kitchen HYDROcodone-acetaminophen (NORCO/VICODIN) 5-325 MG tablet Take 1 tablet by mouth daily as needed.  Marland Kitchen ipratropium (ATROVENT) 0.06 % nasal spray Place 2 sprays into both nostrils 2 (two) times a day.  . montelukast (SINGULAIR) 10 MG tablet Take 1 tablet (10 mg total) by mouth at bedtime.  . Multiple Vitamin (MULTIVITAMIN) tablet Take 1 tablet by mouth daily.  . SUMAtriptan (IMITREX) 50 MG tablet Take 50 mg by mouth every 2 (two) hours as needed for migraine.   . triamterene-hydrochlorothiazide (MAXZIDE-25) 37.5-25 MG per tablet Take 1 tablet by mouth daily.  . [  DISCONTINUED] amoxicillin-clavulanate (AUGMENTIN) 875-125 MG tablet Take 1 tablet by mouth 2 (two) times daily.  . [DISCONTINUED] tiotropium (SPIRIVA) 18 MCG inhalation capsule Place into inhaler and inhale.   No facility-administered encounter medications on file as of 04/24/2021.    Surgical History: Past Surgical History:  Procedure Laterality Date  . ABDOMINAL HYSTERECTOMY    . ARTHROPLASTY  07/07/2012   LEFT SHOULDER  . BREAST BIOPSY Left 2000  . CARDIAC CATHETERIZATION    . COLONOSCOPY    . COLONOSCOPY WITH PROPOFOL N/A 02/03/2020   Procedure: COLONOSCOPY WITH  PROPOFOL;  Surgeon: [place], Benay Pike, MD;  Location: [facility] ENDOSCOPY;  Service: Gastroenterology;  Laterality: N/A;  . JOINT REPLACEMENT     bilateral knee replacements  . LUMBAR LAMINECTOMY/DECOMPRESSION MICRODISCECTOMY N/A 04/28/2014   Procedure: LUMBAR THREE TO FOUR, LUMBAR FOUR TO FIVE  LAMINECTOMY/DECOMPRESSION MICRODISCECTOMY 2 LEVELS;  Surgeon: Ophelia Charter, MD;  Location: [place] [facility];  Service: Neurosurgery;  Laterality: N/A;  L34 L45 laminectomies  . REVERSE SHOULDER ARTHROPLASTY  07/07/2012   Procedure: REVERSE SHOULDER ARTHROPLASTY;  Surgeon: Nita Sells, MD;  Location: [place];  Service: Orthopedics;  Laterality: Left;  . REVERSE SHOULDER ARTHROPLASTY Right 03/10/2018   Procedure: REVERSE SHOULDER ARTHROPLASTY;  Surgeon: Corky Mull, MD;  Location: [facility] ORS;  Service: Orthopedics;  Laterality: Right;  . SHOULDER ARTHROSCOPY     right shoulder    Medical History: Past Medical History:  Diagnosis Date  . Allergies   . Arthritis   . Asthma   . Depression   . Headache(784.0)   . Heart murmur   . Hyperlipidemia   . Hypertension   . PONV (postoperative nausea and vomiting)   . Recurrent UTI   . Sleep apnea    does not wear CPAP  . Spondylolisthesis of lumbar region   . Wears contact lenses     Family History: Family History  Problem Relation Age of Onset  . Breast cancer Neg Hx     Social History   Socioeconomic History  . Marital status: Widowed    Spouse name: Not on file  . Number of children: Not on file  . Years of education: Not on file  . Highest education level: Not on file  Occupational History  . Not on file  Tobacco Use  . Smoking status: Former Smoker    Packs/day: 0.50    Years: 8.00    Pack years: 4.00    Types: Cigarettes    Quit date: 01/08/1986    Years since quitting: 35.3  . Smokeless tobacco: Never Used  Vaping Use  . Vaping Use: Never used  Substance and Sexual Activity  . Alcohol use: No  . Drug use: No  . Sexual  activity: Not Currently    Birth control/protection: Post-menopausal  Other Topics Concern  . Not on file  Social History Narrative  . Not on file   Social Determinants of Health   Financial Resource Strain: Not on file  Food Insecurity: Not on file  Transportation Needs: Not on file  Physical Activity: Not on file  Stress: Not on file  Social Connections: Not on file  Intimate Partner Violence: Not on file     Review of Systems  Constitutional: Negative for chills, diaphoresis and fatigue.  HENT: Negative for ear pain, postnasal drip and sinus pressure.   Eyes: Negative for photophobia, discharge, redness, itching and visual disturbance.  Respiratory: Positive for cough, shortness of breath and wheezing.   Cardiovascular: Negative for chest  pain, palpitations and leg swelling.  Gastrointestinal: Negative for abdominal pain, constipation, diarrhea, nausea and vomiting.  Genitourinary: Negative for dysuria and flank pain.  Musculoskeletal: Negative for arthralgias, back pain, gait problem and neck pain.  Skin: Negative for color change.  Allergic/Immunologic: Negative for environmental allergies and food allergies.  Neurological: Negative for dizziness and headaches.  Hematological: Does not bruise/bleed easily.  Psychiatric/Behavioral: Negative for agitation, behavioral problems (depression) and hallucinations.    Vital Signs: BP (!) 142/98   Pulse 94   Temp 98.4 F (36.9 C)   Resp 16   Ht 5' 3"  (1.6 m)   Wt 176 lb 3.2 oz (79.9 kg)   SpO2 98%   BMI 31.21 kg/m    Physical Exam Constitutional:      General: She is not in acute distress.    Appearance: She is well-developed. She is not diaphoretic.  HENT:     Head: Normocephalic and atraumatic.     Right Ear: Tympanic membrane normal.     Mouth/Throat:     Pharynx: Posterior oropharyngeal erythema present. No oropharyngeal exudate.  Eyes:     Extraocular Movements: Extraocular movements intact.     Pupils:  Pupils are equal, round, and reactive to light.  Neck:     Thyroid: No thyromegaly.     Vascular: No JVD.     Trachea: No tracheal deviation.  Cardiovascular:     Rate and Rhythm: Normal rate and regular rhythm.     Heart sounds: Normal heart sounds. No murmur heard. No friction rub. No gallop.   Pulmonary:     Effort: No respiratory distress.     Breath sounds: Wheezing present. No rales.     Comments: Patient had decreased air entry bilaterally with audible wheezing and congestion Chest:     Chest wall: No tenderness.  Abdominal:     General: Bowel sounds are normal.     Palpations: Abdomen is soft.  Musculoskeletal:        General: Normal range of motion.     Cervical back: Normal range of motion and neck supple.  Lymphadenopathy:     Cervical: No cervical adenopathy.  Skin:    General: Skin is warm and dry.  Neurological:     Mental Status: She is alert and oriented to person, place, and time.     Cranial Nerves: No cranial nerve deficit.  Psychiatric:        Behavior: Behavior normal.        Thought Content: Thought content normal.        Judgment: Judgment normal.       Assessment/Plan: 1. Acute bronchitis with chronic obstructive pulmonary disease (COPD) ([place]) Working diagnosis for this patient is acute exacerbation of asthma or COPD she is given samples of Trelegy to be used once a day instructions are given.  She is to continue her duo nebs at home.  Discussion about further diagnostic including allergy test, CT of the chest and pulmonary function test.  She will be treated with azithromycin to 50 mg once a day for 10 days, add Singulair 10 mg once a day avoid prednisone until the results are available - Spirometry with Graph - Pulmonary Function Test; Future - CBC With Differential - Sed Rate (ESR) - ANA w/Reflex if Positive; Future  2. Allergic rhinoconjunctivitis Add Singulair continue Flonase and Atrovent nasal spray she also just takes Zyrtec and she was  instructed to continue to take it - CBC With Differential - Sed Rate (ESR) -  ANA w/Reflex if Positive; Future  General Counseling: dabney dever understanding of the findings of todays visit and agrees with plan of treatment. I have discussed any further diagnostic evaluation that may be needed or ordered today. We also reviewed her medications today. she has been encouraged to call the office with any questions or concerns that should arise related to todays visit. Patient will have follow-up Dr. Devona Konig after all the diagnostics are done Counseling:  [place] Controlled Substance Database was reviewed by me.  Orders Placed This Encounter  Procedures  . CBC With Differential  . Sed Rate (ESR)  . ANA w/Reflex if Positive  . Spirometry with Graph  . Pulmonary Function Test    Meds ordered this encounter  Medications  . azithromycin (ZITHROMAX) 250 MG tablet    Sig: Take one tab a day for 10 days for uri    Dispense:  10 tablet    Refill:  0  . montelukast (SINGULAIR) 10 MG tablet    Sig: Take 1 tablet (10 mg total) by mouth at bedtime.    Dispense:  30 tablet    Refill:  3  . fluconazole (DIFLUCAN) 150 MG tablet    Sig: Take one tab as needed for 2 days    Dispense:  2 tablet    Refill:  0    Time spent:45 Minutes

## 2021-04-24 NOTE — Patient Instructions (Signed)
spi

## 2021-04-25 ENCOUNTER — Telehealth: Payer: Self-pay

## 2021-04-25 NOTE — Telephone Encounter (Signed)
LMOM to advised pt that DFK ordered labs for her to go have done.

## 2021-05-01 LAB — CBC WITH DIFFERENTIAL
Basophils Absolute: 0.1 10*3/uL (ref 0.0–0.2)
Basos: 1 %
EOS (ABSOLUTE): 0.3 10*3/uL (ref 0.0–0.4)
Eos: 3 %
Hematocrit: 43 % (ref 34.0–46.6)
Hemoglobin: 14.5 g/dL (ref 11.1–15.9)
Immature Grans (Abs): 0 10*3/uL (ref 0.0–0.1)
Immature Granulocytes: 0 %
Lymphocytes Absolute: 1.7 10*3/uL (ref 0.7–3.1)
Lymphs: 21 %
MCH: 29.2 pg (ref 26.6–33.0)
MCHC: 33.7 g/dL (ref 31.5–35.7)
MCV: 87 fL (ref 79–97)
Monocytes Absolute: 0.5 10*3/uL (ref 0.1–0.9)
Monocytes: 7 %
Neutrophils Absolute: 5.5 10*3/uL (ref 1.4–7.0)
Neutrophils: 68 %
RBC: 4.97 x10E6/uL (ref 3.77–5.28)
RDW: 12.3 % (ref 11.7–15.4)
WBC: 8.2 10*3/uL (ref 3.4–10.8)

## 2021-05-01 LAB — SEDIMENTATION RATE: Sed Rate: 12 mm/hr (ref 0–40)

## 2021-05-08 ENCOUNTER — Ambulatory Visit: Payer: Self-pay | Admitting: Internal Medicine

## 2021-05-22 ENCOUNTER — Telehealth: Payer: Self-pay

## 2021-05-22 NOTE — Telephone Encounter (Signed)
Left vm to do covid screening for 05/23/21 appointment-[name] 

## 2021-05-23 ENCOUNTER — Ambulatory Visit: Payer: Medicare Other | Admitting: Internal Medicine

## 2021-06-07 ENCOUNTER — Ambulatory Visit: Payer: Medicare Other | Admitting: Internal Medicine

## 2021-08-10 ENCOUNTER — Other Ambulatory Visit: Payer: Self-pay | Admitting: Family Medicine

## 2021-08-10 DIAGNOSIS — Z1231 Encounter for screening mammogram for malignant neoplasm of breast: Secondary | ICD-10-CM

## 2021-08-30 ENCOUNTER — Other Ambulatory Visit: Payer: Self-pay

## 2021-08-30 ENCOUNTER — Ambulatory Visit
Admission: RE | Admit: 2021-08-30 | Discharge: 2021-08-30 | Disposition: A | Discharge status: Home / Self Care | Payer: Medicare Other | Source: Ambulatory Visit | Attending: Family Medicine | Admitting: Family Medicine

## 2021-08-30 ENCOUNTER — Other Ambulatory Visit: Payer: Self-pay | Admitting: Neurosurgery

## 2021-08-30 ENCOUNTER — Other Ambulatory Visit (HOSPITAL_BASED_OUTPATIENT_CLINIC_OR_DEPARTMENT_OTHER): Payer: Self-pay | Admitting: Neurosurgery

## 2021-08-30 DIAGNOSIS — M4312 Spondylolisthesis, cervical region: Secondary | ICD-10-CM

## 2021-08-30 DIAGNOSIS — Z1231 Encounter for screening mammogram for malignant neoplasm of breast: Secondary | ICD-10-CM | POA: Diagnosis present

## 2021-09-08 ENCOUNTER — Other Ambulatory Visit: Payer: Self-pay

## 2021-09-08 ENCOUNTER — Ambulatory Visit
Admission: RE | Admit: 2021-09-08 | Discharge: 2021-09-08 | Disposition: A | Discharge status: Home / Self Care | Payer: Medicare Other | Source: Ambulatory Visit | Attending: Neurosurgery | Admitting: Neurosurgery

## 2021-09-08 DIAGNOSIS — M4312 Spondylolisthesis, cervical region: Secondary | ICD-10-CM | POA: Diagnosis not present

## 2021-11-29 ENCOUNTER — Ambulatory Visit: Payer: Medicare Other | Admitting: Podiatry

## 2021-11-29 ENCOUNTER — Ambulatory Visit (INDEPENDENT_AMBULATORY_CARE_PROVIDER_SITE_OTHER): Payer: Medicare Other

## 2021-11-29 ENCOUNTER — Other Ambulatory Visit: Payer: Self-pay

## 2021-11-29 ENCOUNTER — Encounter: Payer: Self-pay | Admitting: Podiatry

## 2021-11-29 ENCOUNTER — Ambulatory Visit: Payer: Self-pay | Admitting: Urology

## 2021-11-29 DIAGNOSIS — M722 Plantar fascial fibromatosis: Secondary | ICD-10-CM

## 2021-11-29 NOTE — Progress Notes (Signed)
Subjective:  Patient ID: Paige Higgins, female    DOB: August 01, 1946,  MRN: [id number]  Chief Complaint  Patient presents with   Foot Pain    Left foot pain in the heel 3-4 weeks     75 y.o. female presents with the above complaint.  Patient presents with complaint of left heel pain that has been going for 3 to 4 months has progressed to gotten worse.  She states is painful to walk on painful to step on.  She works for [employer].  She would like to discuss treatment options for this.  She has not seen anyone else prior to seeing me.  She denies any other acute complaints.  Pain scale 7 out of 10 hurts first thing in the morning.  Gets a little bit better throughout the day.   Review of Systems: Negative except as noted in the HPI. Denies N/V/F/Ch.  Past Medical History:  Diagnosis Date   Allergies    Arthritis    Asthma    Depression    Headache(784.0)    Heart murmur    Hyperlipidemia    Hypertension    PONV (postoperative nausea and vomiting)    Recurrent UTI    Sleep apnea    does not wear CPAP   Spondylolisthesis of lumbar region    Wears contact lenses     Current Outpatient Medications:    albuterol (PROVENTIL HFA;VENTOLIN HFA) 108 (90 BASE) MCG/ACT inhaler, Inhale 2 puffs into the lungs every 4 (four) hours as needed. For wheezing., Disp: , Rfl:    Ascorbic Acid (VITAMIN C) 1000 MG tablet, Take 1,000 mg by mouth daily., Disp: , Rfl:    aspirin EC 81 MG tablet, Take 81 mg by mouth every 6 (six) hours as needed for moderate pain. , Disp: , Rfl:    aspirin-acetaminophen-caffeine (EXCEDRIN MIGRAINE) 250-250-65 MG tablet, Take 2 tablets by mouth every 6 (six) hours as needed for headache or migraine. , Disp: , Rfl:    atorvastatin (LIPITOR) 40 MG tablet, Take 40 mg by mouth daily., Disp: , Rfl:    azithromycin (ZITHROMAX) 250 MG tablet, Take one tab a day for 10 days for uri, Disp: 10 tablet, Rfl: 0   chlorpheniramine (CHLOR-TRIMETON) 4 MG tablet, Take 4 mg by mouth 2 (two)  times daily as needed for allergies., Disp: , Rfl:    diclofenac sodium (VOLTAREN) 1 % GEL, Apply 2 g topically 3 (three) times daily as needed (joint pain). , Disp: , Rfl:    fluconazole (DIFLUCAN) 150 MG tablet, Take one tab as needed for 2 days, Disp: 2 tablet, Rfl: 0   fluticasone (FLONASE) 50 MCG/ACT nasal spray, Place 2 sprays into both nostrils daily as needed for allergies or rhinitis., Disp: , Rfl:    HYDROcodone-acetaminophen (NORCO/VICODIN) 5-325 MG tablet, Take 1 tablet by mouth daily as needed., Disp: , Rfl:    ipratropium (ATROVENT) 0.06 % nasal spray, Place 2 sprays into both nostrils 2 (two) times a day., Disp: , Rfl:    montelukast (SINGULAIR) 10 MG tablet, Take 1 tablet (10 mg total) by mouth at bedtime., Disp: 30 tablet, Rfl: 3   Multiple Vitamin (MULTIVITAMIN) tablet, Take 1 tablet by mouth daily., Disp: , Rfl:    SUMAtriptan (IMITREX) 50 MG tablet, Take 50 mg by mouth every 2 (two) hours as needed for migraine. , Disp: , Rfl:    triamterene-hydrochlorothiazide (MAXZIDE-25) 37.5-25 MG per tablet, Take 1 tablet by mouth daily., Disp: , Rfl:   Social History  Tobacco Use  Smoking Status Former   Packs/day: 0.50   Years: 8.00   Pack years: 4.00   Types: Cigarettes   Quit date: 01/08/1986   Years since quitting: 35.9  Smokeless Tobacco Never    No Known Allergies Objective:  There were no vitals filed for this visit. There is no height or weight on file to calculate BMI. Constitutional Well developed. Well nourished.  Vascular Dorsalis pedis pulses palpable bilaterally. Posterior tibial pulses palpable bilaterally. Capillary refill normal to all digits.  No cyanosis or clubbing noted. Pedal hair growth normal.  Neurologic Normal speech. Oriented to person, place, and time. Epicritic sensation to light touch grossly present bilaterally.  Dermatologic Nails well groomed and normal in appearance. No open wounds. No skin lesions.  Orthopedic: Normal joint ROM  without pain or crepitus bilaterally. No visible deformities. Tender to palpation at the calcaneal tuber left. No pain with calcaneal squeeze left. Ankle ROM diminished range of motion left. Silfverskiold Test: positive left.   Radiographs: Taken and reviewed. No acute fractures or dislocations. No evidence of stress fracture.  Plantar heel spur present. Posterior heel spur absent.  Hallux limitus/rigidus noted at the first metatarsophalangeal joint with osteoarthritic changes  Assessment:   1. Plantar fasciitis, left    Plan:  Patient was evaluated and treated and all questions answered.  Plantar Fasciitis, left - XR reviewed as above.  - Educated on icing and stretching. Instructions given.  - Injection delivered to the plantar fascia as below. - DME: Plantar fascial brace dispensed to support the medial longitudinal arch of the foot and offload pressure from the heel and prevent arch collapse during weightbearing - Pharmacologic management: None  Procedure: Injection Tendon/Ligament Location: Left plantar fascia at the glabrous junction; medial approach. Skin Prep: alcohol Injectate: 0.5 cc 0.5% marcaine plain, 0.5 cc of 1% Lidocaine, 0.5 cc kenalog 10. Disposition: Patient tolerated procedure well. Injection site dressed with a band-aid.  No follow-ups on file.

## 2021-12-27 ENCOUNTER — Other Ambulatory Visit: Payer: Self-pay | Admitting: Neurosurgery

## 2021-12-27 DIAGNOSIS — G8929 Other chronic pain: Secondary | ICD-10-CM

## 2022-01-10 ENCOUNTER — Ambulatory Visit: Admission: RE | Admit: 2022-01-10 | Payer: Medicare Other | Source: Ambulatory Visit

## 2022-01-16 ENCOUNTER — Ambulatory Visit: Payer: Medicare Other

## 2022-01-29 ENCOUNTER — Other Ambulatory Visit: Payer: Self-pay

## 2022-01-29 ENCOUNTER — Ambulatory Visit
Admission: RE | Admit: 2022-01-29 | Discharge: 2022-01-29 | Disposition: A | Discharge status: Home / Self Care | Payer: Medicare Other | Source: Ambulatory Visit | Attending: Neurosurgery | Admitting: Neurosurgery

## 2022-01-29 DIAGNOSIS — G8929 Other chronic pain: Secondary | ICD-10-CM | POA: Insufficient documentation

## 2022-01-29 DIAGNOSIS — M5441 Lumbago with sciatica, right side: Secondary | ICD-10-CM | POA: Diagnosis present

## 2022-01-29 MED ORDER — GADOBUTROL 1 MMOL/ML IV SOLN
7.5000 mL | Freq: Once | INTRAVENOUS | Status: AC | PRN
  Administered 2022-01-29: 7.5 mL via INTRAVENOUS

## 2022-02-06 ENCOUNTER — Institutional Professional Consult (permissible substitution): Payer: Medicare Other | Admitting: Pulmonary Disease

## 2022-02-06 NOTE — Progress Notes (Unsigned)
Synopsis: Referred in March 2023 for chest congestion by [name], MD  Subjective:   PATIENT ID: Paige Higgins GENDER: female DOB: 11/27/1946, MRN: [id number]   HPI  No chief complaint on file.  Paige Higgins is a 76 year old woman, former smoker with hypertension, hyperlipidemia and sleep apnea who is referred to pulmonary clinic for chest congestion.     Past Medical History:  Diagnosis Date   Allergies    Arthritis    Asthma    Depression    Headache(784.0)    Heart murmur    Hyperlipidemia    Hypertension    PONV (postoperative nausea and vomiting)    Recurrent UTI    Sleep apnea    does not wear CPAP   Spondylolisthesis of lumbar region    Wears contact lenses      Family History  Problem Relation Age of Onset   Breast cancer Neg Hx      Social History   Socioeconomic History   Marital status: Widowed    Spouse name: Not on file   Number of children: Not on file   Years of education: Not on file   Highest education level: Not on file  Occupational History   Not on file  Tobacco Use   Smoking status: Former    Packs/day: 0.50    Years: 8.00    Pack years: 4.00    Types: Cigarettes    Quit date: 01/08/1986    Years since quitting: 36.1   Smokeless tobacco: Never  Vaping Use   Vaping Use: Never used  Substance and Sexual Activity   Alcohol use: No   Drug use: No   Sexual activity: Not Currently    Birth control/protection: Post-menopausal  Other Topics Concern   Not on file  Social History Narrative   Not on file   Social Determinants of Health   Financial Resource Strain: Not on file  Food Insecurity: Not on file  Transportation Needs: Not on file  Physical Activity: Not on file  Stress: Not on file  Social Connections: Not on file  Intimate Partner Violence: Not on file     No Known Allergies   Outpatient Medications Prior to Visit  Medication Sig Dispense Refill   albuterol (PROVENTIL HFA;VENTOLIN HFA) 108 (90 BASE)  MCG/ACT inhaler Inhale 2 puffs into the lungs every 4 (four) hours as needed. For wheezing.     Ascorbic Acid (VITAMIN C) 1000 MG tablet Take 1,000 mg by mouth daily.     aspirin EC 81 MG tablet Take 81 mg by mouth every 6 (six) hours as needed for moderate pain.      aspirin-acetaminophen-caffeine (EXCEDRIN MIGRAINE) 250-250-65 MG tablet Take 2 tablets by mouth every 6 (six) hours as needed for headache or migraine.      atorvastatin (LIPITOR) 40 MG tablet Take 40 mg by mouth daily.     azithromycin (ZITHROMAX) 250 MG tablet Take one tab a day for 10 days for uri 10 tablet 0   chlorpheniramine (CHLOR-TRIMETON) 4 MG tablet Take 4 mg by mouth 2 (two) times daily as needed for allergies.     diclofenac sodium (VOLTAREN) 1 % GEL Apply 2 g topically 3 (three) times daily as needed (joint pain).      fluconazole (DIFLUCAN) 150 MG tablet Take one tab as needed for 2 days 2 tablet 0   fluticasone (FLONASE) 50 MCG/ACT nasal spray Place 2 sprays into both nostrils daily as needed for allergies or  rhinitis.     HYDROcodone-acetaminophen (NORCO/VICODIN) 5-325 MG tablet Take 1 tablet by mouth daily as needed.     ipratropium (ATROVENT) 0.06 % nasal spray Place 2 sprays into both nostrils 2 (two) times a day.     montelukast (SINGULAIR) 10 MG tablet Take 1 tablet (10 mg total) by mouth at bedtime. 30 tablet 3   Multiple Vitamin (MULTIVITAMIN) tablet Take 1 tablet by mouth daily.     SUMAtriptan (IMITREX) 50 MG tablet Take 50 mg by mouth every 2 (two) hours as needed for migraine.      triamterene-hydrochlorothiazide (MAXZIDE-25) 37.5-25 MG per tablet Take 1 tablet by mouth daily.     No facility-administered medications prior to visit.    Review of Systems  Constitutional:  Negative for chills, fever, malaise/fatigue and weight loss.  HENT:  Negative for congestion, sinus pain and sore throat.   Eyes: Negative.   Respiratory:  Negative for cough, hemoptysis, sputum production, shortness of breath and  wheezing.   Cardiovascular:  Negative for chest pain, palpitations, orthopnea, claudication and leg swelling.  Gastrointestinal:  Negative for abdominal pain, heartburn, nausea and vomiting.  Genitourinary: Negative.   Musculoskeletal:  Negative for joint pain and myalgias.  Skin:  Negative for rash.  Neurological:  Negative for weakness.  Endo/Heme/Allergies: Negative.   Psychiatric/Behavioral: Negative.       Objective:  There were no vitals filed for this visit.   Physical Exam Constitutional:      General: She is not in acute distress.    Appearance: She is not ill-appearing.  HENT:     Head: Normocephalic and atraumatic.  Eyes:     General: No scleral icterus.    Conjunctiva/sclera: Conjunctivae normal.     Pupils: Pupils are equal, round, and reactive to light.  Cardiovascular:     Rate and Rhythm: Normal rate and regular rhythm.     Pulses: Normal pulses.     Heart sounds: Normal heart sounds. No murmur heard. Pulmonary:     Effort: Pulmonary effort is normal.     Breath sounds: Normal breath sounds. No wheezing, rhonchi or rales.  Abdominal:     General: Bowel sounds are normal.     Palpations: Abdomen is soft.  Musculoskeletal:     Right lower leg: No edema.     Left lower leg: No edema.  Lymphadenopathy:     Cervical: No cervical adenopathy.  Skin:    General: Skin is warm and dry.  Neurological:     General: No focal deficit present.     Mental Status: She is alert.  Psychiatric:        Mood and Affect: Mood normal.        Behavior: Behavior normal.        Thought Content: Thought content normal.        Judgment: Judgment normal.      CBC    Component Value Date/Time   WBC 8.2 04/30/2021 0927   WBC 14.9 (H) 06/10/2019 0540   RBC 4.97 04/30/2021 0927   RBC 3.68 (L) 06/10/2019 0540   HGB 14.5 04/30/2021 0927   HCT 43.0 04/30/2021 0927   PLT 268 06/10/2019 0540   MCV 87 04/30/2021 0927   MCH 29.2 04/30/2021 0927   MCH 28.8 06/10/2019 0540    MCHC 33.7 04/30/2021 0927   MCHC 31.8 06/10/2019 0540   RDW 12.3 04/30/2021 0927   LYMPHSABS 1.7 04/30/2021 0927   MONOABS 1.0 (H) 03/11/2018 0536   EOSABS 0.3  04/30/2021 0927   BASOSABS 0.1 04/30/2021 0927   BMP Latest Ref Rng & Units 06/10/2019 06/07/2019 03/11/2018  Glucose 70 - 99 mg/dL 676(P) 81 99  BUN 8 - 23 mg/dL 15 15 16   Creatinine 0.44 - 1.00 mg/dL 9.50 9.32)  Sodium 135 - 145 mmol/L 141 141 138  Potassium 3.5 - 5.1 mmol/L 3.8 3.8 3.7  Chloride 98 - 111 mmol/L 106 107 103  CO2 22 - 32 mmol/L 25 26 26   Calcium 8.9 - 10.3 mg/dL 8.3(L) 9.0 8.5(L)   Chest imaging:  PFT: No flowsheet data found. Sprio 04/2021 FVC 1.5L (56%) FEV1 1.3L (67%) FEV1/FVC 90  Labs:  Path:  Echo:  Heart Catheterization:  Assessment & Plan:   No diagnosis found.  Discussion: ***    Current Outpatient Medications:    albuterol (PROVENTIL HFA;VENTOLIN HFA) 108 (90 BASE) MCG/ACT inhaler, Inhale 2 puffs into the lungs every 4 (four) hours as needed. For wheezing., Disp: , Rfl:    Ascorbic Acid (VITAMIN C) 1000 MG tablet, Take 1,000 mg by mouth daily., Disp: , Rfl:    aspirin EC 81 MG tablet, Take 81 mg by mouth every 6 (six) hours as needed for moderate pain. , Disp: , Rfl:    aspirin-acetaminophen-caffeine (EXCEDRIN MIGRAINE) 250-250-65 MG tablet, Take 2 tablets by mouth every 6 (six) hours as needed for headache or migraine. , Disp: , Rfl:    atorvastatin (LIPITOR) 40 MG tablet, Take 40 mg by mouth daily., Disp: , Rfl:    azithromycin (ZITHROMAX) 250 MG tablet, Take one tab a day for 10 days for uri, Disp: 10 tablet, Rfl: 0   chlorpheniramine (CHLOR-TRIMETON) 4 MG tablet, Take 4 mg by mouth 2 (two) times daily as needed for allergies., Disp: , Rfl:    diclofenac sodium (VOLTAREN) 1 % GEL, Apply 2 g topically 3 (three) times daily as needed (joint pain). , Disp: , Rfl:    fluconazole (DIFLUCAN) 150 MG tablet, Take one tab as needed for 2 days, Disp: 2 tablet, Rfl: 0   fluticasone  (FLONASE) 50 MCG/ACT nasal spray, Place 2 sprays into both nostrils daily as needed for allergies or rhinitis., Disp: , Rfl:    HYDROcodone-acetaminophen (NORCO/VICODIN) 5-325 MG tablet, Take 1 tablet by mouth daily as needed., Disp: , Rfl:    ipratropium (ATROVENT) 0.06 % nasal spray, Place 2 sprays into both nostrils 2 (two) times a day., Disp: , Rfl:    montelukast (SINGULAIR) 10 MG tablet, Take 1 tablet (10 mg total) by mouth at bedtime., Disp: 30 tablet, Rfl: 3   Multiple Vitamin (MULTIVITAMIN) tablet, Take 1 tablet by mouth daily., Disp: , Rfl:    SUMAtriptan (IMITREX) 50 MG tablet, Take 50 mg by mouth every 2 (two) hours as needed for migraine. , Disp: , Rfl:    triamterene-hydrochlorothiazide (MAXZIDE-25) 37.5-25 MG per tablet, Take 1 tablet by mouth daily., Disp: , Rfl:

## 2022-05-22 ENCOUNTER — Other Ambulatory Visit: Payer: Self-pay | Admitting: Internal Medicine

## 2022-07-22 ENCOUNTER — Other Ambulatory Visit: Payer: Self-pay | Admitting: Student

## 2022-07-22 ENCOUNTER — Ambulatory Visit
Admission: RE | Admit: 2022-07-22 | Discharge: 2022-07-22 | Disposition: A | Discharge status: Home / Self Care | Payer: Medicare Other | Source: Ambulatory Visit | Attending: Student | Admitting: Student

## 2022-07-22 ENCOUNTER — Ambulatory Visit: Payer: Medicare Other

## 2022-07-22 DIAGNOSIS — M25552 Pain in left hip: Secondary | ICD-10-CM | POA: Diagnosis present

## 2022-07-22 DIAGNOSIS — Z981 Arthrodesis status: Secondary | ICD-10-CM | POA: Diagnosis present

## 2022-09-03 ENCOUNTER — Encounter: Payer: Self-pay | Admitting: Pulmonary Disease

## 2022-09-03 ENCOUNTER — Ambulatory Visit: Payer: Medicare Other | Admitting: Pulmonary Disease

## 2022-09-03 VITALS — BP 130/84 | HR 102 | Temp 98.4°F | Ht 63.0 in | Wt 178.0 lb

## 2022-09-03 DIAGNOSIS — R053 Chronic cough: Secondary | ICD-10-CM

## 2022-09-03 DIAGNOSIS — J310 Chronic rhinitis: Secondary | ICD-10-CM

## 2022-09-03 DIAGNOSIS — J329 Chronic sinusitis, unspecified: Secondary | ICD-10-CM

## 2022-09-03 DIAGNOSIS — J449 Chronic obstructive pulmonary disease, unspecified: Secondary | ICD-10-CM | POA: Diagnosis not present

## 2022-09-03 DIAGNOSIS — Z87891 Personal history of nicotine dependence: Secondary | ICD-10-CM

## 2022-09-03 LAB — NITRIC OXIDE: Nitric Oxide: 30

## 2022-09-03 MED ORDER — BREO ELLIPTA 100-25 MCG/ACT IN AEPB: 1.0000 | INHALATION_SPRAY | Freq: Every day | RESPIRATORY_TRACT | 3 refills | Status: DC

## 2022-09-03 NOTE — Patient Instructions (Addendum)
We are going to get some blood test performed to check for allergies.  We are going to get a complete breathing test.  I am giving you a trial of an inhaler called Breo Ellipta.  This is basically like the Trelegy but only has 2 medications in which are basically an anti-inflammatory and occasion to open up your bronchial tubes.  Sure you rinse your mouth well after you use it.  It is only 1 puff daily.  You may still use your albuterol (Ventolin) as needed.  DO NOT TAKE THE TRELEGY AS YOU WILL BE ON THE BREO.  We will see you in follow-up in 6 to 8 weeks time call sooner should any new problems arise.

## 2022-09-03 NOTE — Progress Notes (Signed)
Subjective:    Patient ID: Paige Higgins, female    DOB: 09/18/1946, 76 y.o.   MRN: [id number] Patient Care Team: [name], MD as PCP - General [facility])  Chief Complaint  Patient presents with   pulmonary consult    Self referral- wheezing and chest congestion occurs once yearly.     HPI Patient is a 76 year old remote former smoker who presents for evaluation of yearly recurrent cough with associated congestion and sputum production for 8 years.  He is kindly referred by Dr. [name].  She notes that she gets cough with increased chest congestion which leads to wheezing.  Cough is productive of whitish to yellowish sputum.  This occurs usually once a year.  She notes some improvement with Zyrtec which she takes pretty much year-round, she also takes Mucinex as needed.  In the past she has been treated with Trelegy Ellipta appears to be the 200 strength however she takes this only as needed.  Currently she is not taking it.  She has not had any associated fevers, chills or sweats.  No hemoptysis.  No orthopnea or paroxysmal nocturnal dyspnea.  During severe episodes she has to take a steroid taper.  She has not had a steroid taper for at least a year.  She has noticed some minimal shortness of breath over the years attributes this to gaining weight.  This does not interfere with her activities of daily living.  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  She has been told in the past she has sleep apnea however she does not know the severity.  Tried CPAP in the past and did not tolerate.  She has been told by her husband that she does snore but there is no indication of apneic episodes.  Her sleep is restorative per her account.  No issues with daytime sleepiness.  All other histories reviewed as noted below.  Review of Systems A 10 point review of systems was performed and it is as noted above otherwise negative.  Past Medical  History:  Diagnosis Date   Allergies    Arthritis    Asthma    Depression    Headache(784.0)    Heart murmur    Hyperlipidemia    Hypertension    PONV (postoperative nausea and vomiting)    Recurrent UTI    Sleep apnea    does not wear CPAP   Spondylolisthesis of lumbar region    Wears contact lenses    Past Surgical History:  Procedure Laterality Date   ABDOMINAL HYSTERECTOMY     ARTHROPLASTY  07/07/2012   LEFT SHOULDER   BREAST BIOPSY Left 2000   CARDIAC CATHETERIZATION     COLONOSCOPY     COLONOSCOPY WITH PROPOFOL N/A 02/03/2020   Procedure: COLONOSCOPY WITH PROPOFOL;  Surgeon: Toledo, Benay Pike, MD;  Location: [facility] ENDOSCOPY;  Service: Gastroenterology;  Laterality: N/A;   JOINT REPLACEMENT     bilateral knee replacements   LUMBAR LAMINECTOMY/DECOMPRESSION MICRODISCECTOMY N/A 04/28/2014   Procedure: LUMBAR THREE TO FOUR, LUMBAR FOUR TO FIVE  LAMINECTOMY/DECOMPRESSION MICRODISCECTOMY 2 LEVELS;  Surgeon: Ophelia Charter, MD;  Location: [facility];  Service: Neurosurgery;  Laterality: N/A;  L34 L45 laminectomies   REVERSE SHOULDER ARTHROPLASTY  07/07/2012   Procedure: REVERSE SHOULDER ARTHROPLASTY;  Surgeon: Nita Sells, MD;  Location: [facility];  Service: Orthopedics;  Laterality: Left;   REVERSE SHOULDER ARTHROPLASTY Right 03/10/2018   Procedure: REVERSE SHOULDER  ARTHROPLASTY;  Surgeon: Corky Mull, MD;  Location: [facility] ORS;  Service: Orthopedics;  Laterality: Right;   SHOULDER ARTHROSCOPY     right shoulder   Patient Active Problem List   Diagnosis Date Noted   Spinal stenosis 06/09/2019   Status post reverse total shoulder replacement, 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 For wheezing.   Ascorbic Acid (VITAMIN C) 1000 MG tablet Take 1,000 mg by mouth daily.   aspirin EC 81 MG tablet Take 81 mg by mouth every 6 (six) hours as needed for moderate pain.    aspirin-acetaminophen-caffeine (EXCEDRIN MIGRAINE) 250-250-65 MG tablet Take 2 tablets by mouth every 6 (six) hours as needed for headache or migraine.    atorvastatin (LIPITOR) 40 MG tablet Take 40 mg by mouth daily.   chlorpheniramine (CHLOR-TRIMETON) 4 MG tablet Take 4 mg by mouth 2 (two) times daily as needed for allergies.   diclofenac sodium (VOLTAREN) 1 % GEL Apply 2 g topically 3 (three) times daily as needed (joint pain).    fluticasone (FLONASE) 50 MCG/ACT nasal spray Place 2 sprays into both nostrils daily as needed for allergies or rhinitis.   HYDROcodone-acetaminophen (NORCO/VICODIN) 5-325 MG tablet Take 1 tablet by mouth daily as needed.   ipratropium (ATROVENT) 0.06 % nasal spray Place 2 sprays into both nostrils 2 (two) times a day.   Multiple Vitamin (MULTIVITAMIN) tablet Take 1 tablet by mouth daily.   SUMAtriptan (IMITREX) 50 MG tablet Take 50 mg by mouth every 2 (two) hours as needed for migraine.    triamterene-hydrochlorothiazide (MAXZIDE-25) 37.5-25 MG per tablet Take 1 tablet by mouth daily.   [DISCONTINUED] azithromycin (ZITHROMAX) 250 MG tablet Take one tab a day for 10 days for uri   [DISCONTINUED] fluconazole (DIFLUCAN) 150 MG tablet Take one tab as needed for 2 days   Immunization History  Administered Date(s) Administered   Influenza Inj Mdck Quad Pf 09/23/2018    Influenza Split 09/16/2014   Influenza,inj,Quad PF,6+ Mos 08/27/2017   Influenza-Unspecified 08/27/2017, 08/15/2019, 08/13/2022   PFIZER Comirnaty(Gray Top)Covid-19 Tri-Sucrose Vaccine 02/04/2020, 03/03/2020   Pneumococcal Conjugate-13 09/23/2018   Tdap 07/08/2019      Objective:   Physical Exam BP 130/84 (BP Location: Left Arm, Cuff Size: Normal)   Pulse (!) 102   Temp 98.4 F (36.9 C) (Oral)   Ht 5\' 3"  (1.6 m)   Wt 178 lb (80.7 kg)   SpO2 96%   BMI 31.53 kg/m  GENERAL: Obese woman, no acute distress. HEAD: Normocephalic, atraumatic.  EYES: Pupils equal, round, reactive to light.  No scleral icterus.  MOUTH: Intact dentition, oral mucosa moist.  Mallampati III. NECK: Supple. No thyromegaly. Trachea midline. No  JVD.  No adenopathy. PULMONARY: Good air entry bilaterally.  Coarse breath sounds otherwise no adventitious sounds. CARDIOVASCULAR: S1 and S2.  Tachycardic with regular rhythm.  No rubs, murmurs or gallops heard. ABDOMEN: Obese otherwise benign. MUSCULOSKELETAL: No joint deformity, no clubbing, no edema.  NEUROLOGIC: No overt focal deficit, no gait disturbance, speech is fluent. SKIN: Intact,warm,dry. PSYCH: Mood and behavior normal.  Lab Results  Component Value Date   NITRICOXIDE 30 09/03/2022      Assessment & Plan:     ICD-10-CM   1. Asthmatic bronchitis , chronic (HCC)  J44.9 DG Chest 2 View    CBC w/Diff    Allergen Panel (27) + IGE    Pulmonary Function Test [facility] Only    Nitric oxide    CANCELED: Nitric oxide   Suspect her symptoms are driven by chronic asthmatic bronchitis FENO 30 today Trial of Breo 100 daily Check CBC with differential and allergen panel    2. Chronic cough  R05.3 DG Chest 2 View    CBC w/Diff    Allergen Panel (27) + IGE    Pulmonary Function Test [facility] Only    CANCELED: Nitric oxide   Recurrent chronic cough due to poorly controlled asthmatic bronchitis PFTs, allergen evaluation as above Chest x-ray PA and lateral    3.  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 Former light tobacco smoker  Z87.891    No evidence of relapse Total pack years 3-4     Orders Placed This Encounter  Procedures   DG Chest 2 View    Standing Status:   Future    Standing Expiration Date:   12/03/2022    Order Specific Question:   Reason for Exam (SYMPTOM  OR DIAGNOSIS REQUIRED)    Answer:   Cough, shortness of breath    Order Specific Question:   Preferred imaging location?    Answer:   [facility]   CBC w/Diff    Standing Status:   Future    Standing Expiration Date:   01/03/2023   Allergen Panel (27) + IGE    Standing Status:   Future    Standing Expiration Date:   01/03/2023   Pulmonary Function Test [facility] Only    Standing Status:   Future    Standing Expiration Date:   01/03/2023    Order Specific Question:   Full PFT: includes the following: basic spirometry, spirometry pre & post bronchodilator, diffusion capacity (DLCO), lung volumes    Answer:   Full PFT   Nitric oxide   Meds ordered this encounter  Medications   BREO ELLIPTA 100-25 MCG/ACT AEPB    Sig: Inhale 1 puff into the lungs daily.    Dispense:  60 each    Refill:  3   Patient in follow-up in 6 to 8 weeks time she is to contact us prior to that time should any new difficulties arise.  Renold Don, MD Advanced Bronchoscopy P[facility]    *This note was dictated using voice recognition software/Dragon.  Despite best efforts to proofread, errors can occur which can change the meaning. Any transcriptional errors that result from this process are unintentional and may not be fully corrected at the time of dictation.

## 2022-09-24 DIAGNOSIS — I351 Nonrheumatic aortic (valve) insufficiency: Secondary | ICD-10-CM | POA: Insufficient documentation

## 2022-10-17 ENCOUNTER — Ambulatory Visit: Payer: Medicare Other

## 2022-10-18 ENCOUNTER — Ambulatory Visit: Payer: Medicare Other | Admitting: Pulmonary Disease

## 2022-11-04 ENCOUNTER — Other Ambulatory Visit: Payer: Self-pay | Admitting: Family Medicine

## 2022-11-04 DIAGNOSIS — Z1231 Encounter for screening mammogram for malignant neoplasm of breast: Secondary | ICD-10-CM

## 2022-11-18 ENCOUNTER — Ambulatory Visit
Admission: RE | Admit: 2022-11-18 | Discharge: 2022-11-18 | Disposition: A | Discharge status: Home / Self Care | Payer: Medicare Other | Source: Ambulatory Visit | Attending: Family Medicine | Admitting: Family Medicine

## 2022-11-18 DIAGNOSIS — Z1231 Encounter for screening mammogram for malignant neoplasm of breast: Secondary | ICD-10-CM | POA: Insufficient documentation

## 2022-12-19 DIAGNOSIS — R7303 Prediabetes: Secondary | ICD-10-CM | POA: Insufficient documentation

## 2023-03-06 ENCOUNTER — Emergency Department: Payer: Medicare Other

## 2023-03-06 ENCOUNTER — Other Ambulatory Visit: Payer: Self-pay

## 2023-03-06 ENCOUNTER — Emergency Department
Admission: EM | Admit: 2023-03-06 | Discharge: 2023-03-06 | Disposition: A | Discharge status: Home / Self Care | Payer: Medicare Other | Attending: Emergency Medicine | Admitting: Emergency Medicine

## 2023-03-06 DIAGNOSIS — I2510 Atherosclerotic heart disease of native coronary artery without angina pectoris: Secondary | ICD-10-CM | POA: Diagnosis not present

## 2023-03-06 DIAGNOSIS — Z20822 Contact with and (suspected) exposure to covid-19: Secondary | ICD-10-CM | POA: Insufficient documentation

## 2023-03-06 DIAGNOSIS — I700 Atherosclerosis of aorta: Secondary | ICD-10-CM | POA: Insufficient documentation

## 2023-03-06 DIAGNOSIS — S3991XA Unspecified injury of abdomen, initial encounter: Secondary | ICD-10-CM | POA: Diagnosis present

## 2023-03-06 DIAGNOSIS — I517 Cardiomegaly: Secondary | ICD-10-CM | POA: Diagnosis not present

## 2023-03-06 DIAGNOSIS — R059 Cough, unspecified: Secondary | ICD-10-CM | POA: Diagnosis not present

## 2023-03-06 DIAGNOSIS — S301XXA Contusion of abdominal wall, initial encounter: Secondary | ICD-10-CM | POA: Insufficient documentation

## 2023-03-06 DIAGNOSIS — B9710 Unspecified enterovirus as the cause of diseases classified elsewhere: Secondary | ICD-10-CM | POA: Diagnosis not present

## 2023-03-06 DIAGNOSIS — X501XXA Overexertion from prolonged static or awkward postures, initial encounter: Secondary | ICD-10-CM | POA: Diagnosis not present

## 2023-03-06 DIAGNOSIS — Z1152 Encounter for screening for COVID-19: Secondary | ICD-10-CM | POA: Insufficient documentation

## 2023-03-06 DIAGNOSIS — J439 Emphysema, unspecified: Secondary | ICD-10-CM | POA: Diagnosis not present

## 2023-03-06 DIAGNOSIS — R918 Other nonspecific abnormal finding of lung field: Secondary | ICD-10-CM | POA: Insufficient documentation

## 2023-03-06 LAB — LIPID PANEL
Cholesterol: 154 mg/dL (ref 0–200)
HDL: 61 mg/dL (ref >40)
LDL Cholesterol: 73 mg/dL (ref 0–99)
Total CHOL/HDL Ratio: 2.5 RATIO
Triglycerides: 98 mg/dL (ref <150)
VLDL: 20 mg/dL (ref 0–40)

## 2023-03-06 LAB — CBC
HCT: 38.2 % (ref 36.0–46.0)
Hemoglobin: 12.3 g/dL (ref 12.0–15.0)
MCH: 29.4 pg (ref 26.0–34.0)
MCHC: 32.2 g/dL (ref 30.0–36.0)
MCV: 91.4 fL (ref 80.0–100.0)
Platelets: 354 10*3/uL (ref 150–400)
RBC: 4.18 MIL/uL (ref 3.87–5.11)
RDW: 14 % (ref 11.5–15.5)
WBC: 13 10*3/uL — ABNORMAL HIGH (ref 4.0–10.5)
nRBC: 0 % (ref 0.0–0.2)

## 2023-03-06 LAB — RESPIRATORY PANEL BY PCR

## 2023-03-06 LAB — PROTIME-INR
INR: 1 (ref 0.8–1.2)
Prothrombin Time: 13.1 seconds (ref 11.4–15.2)

## 2023-03-06 LAB — COMPREHENSIVE METABOLIC PANEL
ALT: 23 U/L (ref 0–44)
AST: 26 U/L (ref 15–41)
Albumin: 3.4 g/dL — ABNORMAL LOW (ref 3.5–5.0)
Alkaline Phosphatase: 80 U/L (ref 38–126)
Anion gap: 12 (ref 5–15)
BUN: 23 mg/dL (ref 8–23)
CO2: 26 mmol/L (ref 22–32)
Calcium: 8.8 mg/dL — ABNORMAL LOW (ref 8.9–10.3)
Chloride: 98 mmol/L (ref 98–111)
Creatinine, Ser: 0.94 mg/dL (ref 0.44–1.00)
GFR, Estimated: >60 mL/min (ref >60)
Glucose, Bld: 101 mg/dL — ABNORMAL HIGH (ref 70–99)
Potassium: 3 mmol/L — ABNORMAL LOW (ref 3.5–5.1)
Sodium: 136 mmol/L (ref 135–145)
Total Bilirubin: 0.9 mg/dL (ref 0.3–1.2)
Total Protein: 6.1 g/dL — ABNORMAL LOW (ref 6.5–8.1)

## 2023-03-06 LAB — RESP PANEL BY RT-PCR (FLU A&B, COVID) ARPGX2
Influenza A by PCR: NEGATIVE
Influenza B by PCR: NEGATIVE
SARS Coronavirus 2 by RT PCR: NEGATIVE

## 2023-03-06 LAB — TYPE AND SCREEN
ABO/RH(D): A NEG
Antibody Screen: NEGATIVE

## 2023-03-06 LAB — LIPASE, BLOOD: Lipase: 30 U/L (ref 11–51)

## 2023-03-06 LAB — TSH: TSH: 2.131 u[IU]/mL (ref 0.350–4.500)

## 2023-03-06 MED ORDER — POTASSIUM CHLORIDE CRYS ER 20 MEQ PO TBCR
40.0000 meq | EXTENDED_RELEASE_TABLET | Freq: Once | ORAL | Status: AC
  Administered 2023-03-06: 40 meq via ORAL
  Filled 2023-03-06: qty 2

## 2023-03-06 MED ORDER — IOHEXOL 350 MG/ML SOLN
100.0000 mL | Freq: Once | INTRAVENOUS | Status: AC | PRN
  Administered 2023-03-06: 100 mL via INTRAVENOUS

## 2023-03-06 NOTE — ED Provider Notes (Signed)
[facility] Provider Note    Event Date/Time   First MD Initiated Contact with Patient 03/06/23 1209     (approximate)   History   Bleeding/Bruising   HPI  Paige Higgins is a 77 y.o. female   who reports that she has had coughing for over a week.  During 1 episode she felt a "pop" feeling in abdomen.  She started having significant amount of bruising across her abdomen since.  She relates it is not painful, but she has had a lot of bruising.  She took antibiotic, and her cough has been improving but she went and saw her cardiologist today and they looked at her abdomen and told her she needed to go to the ER to make sure she did not have any internal bleeding  She reports that her cough is improved she does not have any fevers, she still has a little bit of a lingering cough but feels better.  Additionally, her abdomen is not particularly painful but is quite bruised.      Physical Exam   Triage Vital Signs: ED Triage Vitals  Enc Vitals Group     BP 03/06/23 1159 135/71     Pulse Rate 03/06/23 1159 (!) 109     Resp 03/06/23 1159 18     Temp 03/06/23 1159 98.2 F (36.8 C)     Temp Source 03/06/23 1159 Oral     SpO2 03/06/23 1159 97 %     Weight --      Height 03/06/23 1200 5\' 4"  (1.626 m)     Head Circumference --      Peak Flow --      Pain Score 03/06/23 1200 0     Pain Loc --      Pain Edu? --      Excl. in GC? --     Most recent vital signs: Vitals:   03/06/23 1159 03/06/23 1621  BP: 135/71 138/85  Pulse: (!) 109 (!) 103  Resp: 18 18  Temp: 98.2 F (36.8 C) 97.9 F (36.6 C)  SpO2: 97% 95%     General: Awake, no distress.  Seasonal cough but no distress CV:  Good peripheral perfusion.  Normal rate and tones Resp:  Normal effort.  Occasional cough, but otherwise clear lungs Abd:  No distention.  The abdomen is notably bruises across the entirety of the abdomen with what appears to be bruising involving both flanks and the  entirety of the abdomen.  There is no areas of warmth or obvious coalescent erythematous to be suggestive of infection Other:    See clinical media uploaded  ED Results / Procedures / Treatments   Labs (all labs ordered are listed, but only abnormal results are displayed) Labs Reviewed  RESPIRATORY PANEL BY PCR - Abnormal; Notable for the following components:      Result Value   Rhinovirus / Enterovirus DETECTED (*)    All other components within normal limits  CBC - Abnormal; Notable for the following components:   WBC 13.0 (*)    All other components within normal limits  COMPREHENSIVE METABOLIC PANEL - Abnormal; Notable for the following components:   Potassium 3.0 (*)    Glucose, Bld 101 (*)    Calcium 8.8 (*)    Total Protein 6.1 (*)    Albumin 3.4 (*)    All other components within normal limits  HEMOGLOBIN A1C - Abnormal; Notable for the following components:   Hgb A1c  MFr Bld 5.8 (*)    All other components within normal limits  RESP PANEL BY RT-PCR (FLU A&B, COVID) ARPGX2  LIPASE, BLOOD  PROTIME-INR  LIPID PANEL  TSH  TYPE AND SCREEN     RADIOLOGY   CT interpreted by me grossly is concerning for hematoma of the abdominal wall  CT Angio Chest/Abd/Pel for Dissection W and/or Wo Contrast  Result Date: 03/06/2023 CLINICAL DATA:  Evaluate for aortic aneurysm. Bruising to abdomen which began after starting Levaquin EXAM: CT ANGIOGRAPHY CHEST, ABDOMEN AND PELVIS TECHNIQUE: Non-contrast CT of the chest was initially obtained. Multidetector CT imaging through the chest, abdomen and pelvis was performed using the standard protocol during bolus administration of intravenous contrast. Multiplanar reconstructed images and MIPs were obtained and reviewed to evaluate the vascular anatomy. RADIATION DOSE REDUCTION: This exam was performed according to the departmental dose-optimization program which includes automated exposure control, adjustment of the mA and/or kV according to  patient size and/or use of iterative reconstruction technique. CONTRAST:  OMNIPAQUE IOHEXOL 350 MG/ML SOLN COMPARISON:  CT AP 08/31/2018 FINDINGS: CTA CHEST FINDINGS Cardiovascular: Preferential opacification of the thoracic aorta. No evidence of thoracic aortic aneurysm or dissection. Mild cardiac enlargement. No pericardial effusion. Aortic atherosclerosis. Coronary artery calcifications. Mediastinum/Nodes: No enlarged mediastinal, hilar, or axillary lymph nodes. Thyroid gland, trachea, and esophagus demonstrate no significant findings. Lungs/Pleura: No pleural effusion identified. Mild paraseptal emphysema. Patchy area of ground-glass and airspace density within the anterior right upper lobe and anterior basal right upper lobe, image 41/7, image 65/7 and image 70/7. Subsegmental atelectasis with ground-glass attenuation noted in the medial right middle lobe. Musculoskeletal: Bilateral shoulder arthroplasty. Thoracic spondylosis. No acute or suspicious osseous findings. Review of the MIP images confirms the above findings. CTA ABDOMEN AND PELVIS FINDINGS VASCULAR Aorta: Normal caliber aorta without aneurysm, dissection, vasculitis or significant stenosis. Aortic atherosclerotic calcifications. Celiac: Patent without evidence of aneurysm, dissection, vasculitis or significant stenosis. SMA: Patent without evidence of aneurysm, dissection, vasculitis or significant stenosis. Renals: Both renal arteries are patent without evidence of aneurysm, dissection, vasculitis, fibromuscular dysplasia or significant stenosis. IMA: Patent without evidence of aneurysm, dissection, vasculitis or significant stenosis. Inflow: Patent without evidence of aneurysm, dissection, vasculitis or significant stenosis. Veins: No obvious venous abnormality within the limitations of this arterial phase study. Review of the MIP images confirms the above findings. NON-VASCULAR Hepatobiliary: No focal liver abnormality is seen. No  gallstones, gallbladder wall thickening, or biliary dilatation. Pancreas: Unremarkable. No pancreatic ductal dilatation or surrounding inflammatory changes. Spleen: Normal in size without focal abnormality. Adrenals/Urinary Tract: Normal adrenal glands. No nephrolithiasis or hydronephrosis. Too small to characterize exophytic cyst off the lateral cortex of the left kidney measures 7 mm. No follow-up imaging recommended. Bladder is unremarkable. Stomach/Bowel: Stomach is within normal limits. Appendix appears normal. No evidence of bowel wall thickening, distention, or inflammatory changes. Colonic diverticulosis noted without signs of acute diverticulitis. Lymphatic: No abdominopelvic adenopathy. Reproductive: Status post hysterectomy. No adnexal masses. Other: There is no free fluid or fluid collections. No pneumoperitoneum. Musculoskeletal: Within the right ventral abdominal wall there is an intramuscular hematoma involving the rectus muscle measuring 12.7 x 3.7 by 12.1 cm (volume = 300 cm^3), image 157/6 and image 91/10. Within the lower ventral abdominal wall there is skin thickening and subcutaneous soft tissue stranding. Lumbar spondylosis identified.  Status post PLIF at L4 through S1. Review of the MIP images confirms the above findings. IMPRESSION: 1. No evidence for aortic aneurysm or dissection. 2. 12.7 x 3.7 by 12.1  cm (volume = 300 cm^3) right ventral abdominal wall intramuscular hematoma involving the right rectus muscle. 3. Patchy area of ground-glass and airspace density within the anterior right upper lobe and anterior basal right upper lobe. Findings are favored to represent an inflammatory or infectious process. Advise follow-up imaging to ensure complete resolution. 4. Skin thickening and subcutaneous soft tissue stranding within the lower ventral abdominal wall. Correlate for any clinical signs or symptoms of cellulitis. 5.  Emphysema (ICD10-J43.9). Electronically Signed   By: Signa Kell  M.D.   On: 03/06/2023 13:48       PROCEDURES:  Critical Care performed: No  Procedures   MEDICATIONS ORDERED IN ED: Medications  iohexol (OMNIPAQUE) 350 MG/ML injection 100 mL (100 mLs Intravenous Contrast Given 03/06/23 1316)  potassium chloride SA (KLOR-CON M) CR tablet 40 mEq (40 mEq Oral Given 03/06/23 1624)     IMPRESSION / MDM / ASSESSMENT AND PLAN / ED COURSE  I reviewed the triage vital signs and the nursing notes.                              Differential diagnosis includes, but is not limited to, likely abdominal muscle strain or tear with associated abdominal hematoma formation occurring about a week ago.  Based on the imaging and workup here she is hemodynamically appropriate with normal hemoglobin.  Her white blood cell count is elevated but this is in the setting of recent upper respiratory type infection versus treated pneumonia and she is completing prednisone.  No fever and she reports her symptoms are improving  Patient's presentation is most consistent with acute complicated illness / injury requiring diagnostic workup.  Given the extent of bruising CT imaging was performed and notable abdominal wall hematoma.  There is no associated symptoms to be suggestive of acute infection at this point.  She seems to be overall improving and she reports the pain in her stomach is much better, and the bruising been present for a week I think the bruising looks concerning but at this time clinically does not appear that she has the compensated related to it.  Will have her continue to monitor her symptoms careful return precautions advised patient understanding agreeable with plan Return precautions and treatment recommendations and follow-up discussed with the patient who is agreeable with the plan.  Discussed her cough elevated white count and symptoms with Dr. Rivka Safer, she advises that given the patient's course of treatment improvement in symptoms this is most likely viral  and would recommend against further antibiotic therapy at this time          FINAL CLINICAL IMPRESSION(S) / ED DIAGNOSES   Final diagnoses:  Abdominal wall hematoma, initial encounter  Lung infiltrate on CT     Rx / DC Orders   ED Discharge Orders     None        Note:  This document was prepared using Dragon voice recognition software and may include unintentional dictation errors.   Sharyn Creamer, MD 03/21/23 1436

## 2023-03-06 NOTE — Discharge Instructions (Addendum)
   Your bruising in her abdomen is from an area of blood pooling, likely caused by injury to your muscles of your abdominal wall due to coughing about a week ago.  If you notice the area becomes more swollen, painful, you develop a fever, or other concerns return to the ER right away.  Additionally, we have sent viral testing to determine if you might have a prolonged virus causing your cough and COPD symptoms.  You should come back to the emergency room right away if you develop fever, increased trouble breathing, feel fatigued, weak lightheaded or other concerns arise.  Please follow-up closely with Dr. Raul Del.  Continue your prednisone and typical nebulizer treatments as well

## 2023-03-06 NOTE — ED Notes (Signed)
Pt here from [facility] clinic for a cough and being Tx'ed ABX, pt here for ABD bruising, pt is not on blood thinners.

## 2023-03-06 NOTE — ED Triage Notes (Addendum)
Pt to ED sent from Dr Delora Fuel for bruising to abdomen for over 1 week. No blood thinners. States started after taking levaquin. Also reports has been "violently coughing". Abdomen distended, significant bruising noted to abdomen and groin area. NAD noted

## 2023-03-07 LAB — HEMOGLOBIN A1C
Hgb A1c MFr Bld: 5.8 % — ABNORMAL HIGH (ref 4.8–5.6)
Mean Plasma Glucose: 120 mg/dL

## 2023-04-10 ENCOUNTER — Telehealth: Payer: Self-pay | Admitting: Pulmonary Disease

## 2023-04-10 NOTE — Telephone Encounter (Signed)
Dr. Jayme Cloud you saw this patient 09/03/22 and order PFT and to return in 6/8 weeks. Patient went to see Dr. Meredeth Ide on 10/07/22, 02/21/23 and Melissa sent mychart message telling the patient she was past due for follow up appt and she will be seeing Rhunette Croft on 05/02/23. The PFT order expired on 01/03/23 should I try to get the PFT scheduled.

## 2023-04-10 NOTE — Telephone Encounter (Signed)
It appears that the patient is seeing Dr. Meredeth Ide.  She cannot see to pulmonologist at the same time.  She only came here 1 time and did not follow-up.  Need to see which pulmonologist she is to follow with but it looks like she saw Dr. Meredeth Ide recently.  Would not schedule PFTs at this juncture.  To determine which pulmonologist the patient is going to follow-up with.

## 2023-04-11 NOTE — Telephone Encounter (Signed)
Pt has stated she will not be seeing Meredeth Ide anymore.

## 2023-04-11 NOTE — Telephone Encounter (Signed)
We will wait for the appt that the patient has with Paige Higgins on 05/02/23 to see what she might order

## 2023-04-23 ENCOUNTER — Ambulatory Visit: Payer: Medicare Other | Admitting: Nurse Practitioner

## 2023-05-02 ENCOUNTER — Ambulatory Visit: Payer: Medicare Other | Admitting: Nurse Practitioner

## 2023-05-07 ENCOUNTER — Telehealth: Payer: Self-pay | Admitting: Physician Assistant

## 2023-05-08 ENCOUNTER — Ambulatory Visit: Payer: Medicare Other | Admitting: Physician Assistant

## 2023-05-13 ENCOUNTER — Ambulatory Visit: Payer: Medicare Other | Admitting: Nurse Practitioner

## 2023-05-16 ENCOUNTER — Ambulatory Visit: Payer: Medicare Other | Admitting: Nurse Practitioner

## 2023-06-13 ENCOUNTER — Other Ambulatory Visit: Payer: Self-pay | Admitting: Student

## 2023-06-13 DIAGNOSIS — M4310 Spondylolisthesis, site unspecified: Secondary | ICD-10-CM

## 2023-07-02 ENCOUNTER — Ambulatory Visit: Admission: RE | Admit: 2023-07-02 | Payer: Medicare Other | Source: Ambulatory Visit

## 2023-07-02 DIAGNOSIS — M4310 Spondylolisthesis, site unspecified: Secondary | ICD-10-CM

## 2023-07-09 ENCOUNTER — Ambulatory Visit: Payer: Medicare Other | Admitting: Nurse Practitioner

## 2023-07-14 ENCOUNTER — Other Ambulatory Visit: Payer: Self-pay

## 2023-07-14 DIAGNOSIS — R053 Chronic cough: Secondary | ICD-10-CM

## 2023-07-22 ENCOUNTER — Other Ambulatory Visit: Payer: Medicare Other

## 2023-08-14 ENCOUNTER — Ambulatory Visit: Payer: Medicare Other | Attending: Student in an Organized Health Care Education/Training Program

## 2023-08-14 DIAGNOSIS — R053 Chronic cough: Secondary | ICD-10-CM | POA: Insufficient documentation

## 2023-08-14 DIAGNOSIS — J984 Other disorders of lung: Secondary | ICD-10-CM | POA: Insufficient documentation

## 2023-08-14 DIAGNOSIS — Z87891 Personal history of nicotine dependence: Secondary | ICD-10-CM | POA: Insufficient documentation

## 2023-08-14 LAB — PULMONARY FUNCTION TEST ARMC ONLY
DL/VA % pred: 141 %
DL/VA: 5.85 ml/min/mmHg/L
DLCO unc % pred: 96 %
DLCO unc: 16.95 ml/min/mmHg
FEF 25-75 Post: 1.36 L/sec
FEF 25-75 Pre: 1.04 L/s
FEF2575-%Change-Post: 31 %
FEF2575-%Pred-Post: 97 %
FEF2575-%Pred-Pre: 74 %
FEV1-%Change-Post: 16 %
FEV1-%Pred-Post: 61 %
FEV1-%Pred-Pre: 53 %
FEV1-Post: 1.12 L
FEV1-Pre: 0.97 L
FEV1FVC-%Change-Post: 12 %
FEV1FVC-%Pred-Pre: 102 %
FEV6-%Change-Post: 11 %
FEV6-%Pred-Post: 56 %
FEV6-%Pred-Pre: 50 %
FEV6-Post: 1.31 L
FEV6-Pre: 1.17 L
FEV6FVC-%Pred-Post: 105 %
FEV6FVC-%Pred-Pre: 105 %
FVC-%Change-Post: 2 %
FVC-%Pred-Post: 53 %
FVC-%Pred-Pre: 51 %
FVC-Post: 1.31 L
FVC-Pre: 1.27 L
Post FEV1/FVC ratio: 86 %
Post FEV6/FVC ratio: 100 %
Pre FEV1/FVC ratio: 76 %
Pre FEV6/FVC Ratio: 100 %
RV % pred: 105 %
RV: 2.36 L
TLC % pred: 78 %
TLC: 3.72 L

## 2023-08-14 MED ORDER — ALBUTEROL SULFATE (2.5 MG/3ML) 0.083% IN NEBU
2.5000 mg | INHALATION_SOLUTION | Freq: Once | RESPIRATORY_TRACT | Status: AC
  Administered 2023-08-14: 2.5 mg via RESPIRATORY_TRACT
  Filled 2023-08-14: qty 3

## 2023-08-15 ENCOUNTER — Ambulatory Visit: Payer: Medicare Other | Admitting: Student in an Organized Health Care Education/Training Program

## 2023-08-15 ENCOUNTER — Encounter: Payer: Self-pay | Admitting: Student in an Organized Health Care Education/Training Program

## 2023-08-15 VITALS — BP 128/82 | HR 85 | Temp 97.6°F | Ht 62.0 in | Wt 174.0 lb

## 2023-08-15 DIAGNOSIS — J4489 Other specified chronic obstructive pulmonary disease: Secondary | ICD-10-CM

## 2023-08-15 DIAGNOSIS — Z23 Encounter for immunization: Secondary | ICD-10-CM

## 2023-08-15 MED ORDER — BUDESONIDE-FORMOTEROL FUMARATE 160-4.5 MCG/ACT IN AERO: 2.0000 | INHALATION_SPRAY | Freq: Two times a day (BID) | RESPIRATORY_TRACT | 12 refills | Status: AC

## 2023-08-15 MED ORDER — AEROCHAMBER MV MISC: 0 refills | Status: AC

## 2023-08-15 NOTE — Progress Notes (Unsigned)
Synopsis: Referred in for asthma by [name], MD  Assessment & Plan:   1. Asthmatic bronchitis , chronic  Patient is presenting for the evaluation of asthma that is sub-optimally controlled given lack of compliance with inhaler regimen. Patient reports no occupational exposures or current smoking that could be contributing to symptoms. PFT's performed prior to today's visit show bronchial hyper-reactivity with 16% improvement in FEV1 following albuterol challenge. Patient reported symptomatic improvement with albuterol during PFT's.  Given this, I agree with the diagnosis of asthma that the patient carries. I will switch her from Trelegy to LABA/ICS with Symbicort. I have explained to the patient how to use her inhaler, and will also prescribe her a spacer device to help with medication delivery. I will also check for T-helper cell type response with a CBC with differential and an allergen panel. Finally, she will get her vaccines in clinic today (Prevnar 20 and flu shot) and have asked her to get her RSV and COVID vaccines at the local pharmacy.   - budesonide-formoterol (SYMBICORT) 160-4.5 MCG/ACT inhaler; Inhale 2 puffs into the lungs in the morning and at bedtime.  Dispense: 1 each; Refill: 12 - Spacer/Aero-Holding Chambers (AEROCHAMBER MV) inhaler; Use as instructed  Dispense: 1 each; Refill: 0 - Allergen Panel (27) + IGE - CBC with Differential/Platelet - Pneumococcal conjugate vaccine 20-valent (Prevnar-20) in clinic today - Influenza vaccine in clinic today   Return in about 3 months (around 11/14/2023).  I spent 30 minutes caring for this patient today, including preparing to see the patient, obtaining a medical history , reviewing a separately obtained history, performing a medically appropriate examination and/or evaluation, counseling and educating the patient/family/caregiver, ordering medications, tests, or procedures, documenting clinical information in the electronic  health record, and independently interpreting results (not separately reported/billed) and communicating results to the patient/family/caregiver  Raechel Chute, MD [facility]   End of visit medications:  Meds ordered this encounter  Medications   budesonide-formoterol (SYMBICORT) 160-4.5 MCG/ACT inhaler    Sig: Inhale 2 puffs into the lungs in the morning and at bedtime.    Dispense:  1 each    Refill:  12   Spacer/Aero-Holding Chambers (AEROCHAMBER MV) inhaler    Sig: Use as instructed    Dispense:  1 each    Refill:  0     Current Outpatient Medications:    albuterol (PROVENTIL HFA;VENTOLIN HFA) 108 (90 BASE) MCG/ACT inhaler, Inhale 2 puffs into the lungs every 4 (four) hours as needed. For wheezing., Disp: , Rfl:    Ascorbic Acid (VITAMIN C) 1000 MG tablet, Take 1,000 mg by mouth daily., Disp: , Rfl:    aspirin EC 81 MG tablet, Take 81 mg by mouth every 6 (six) hours as needed for moderate pain. , Disp: , Rfl:    aspirin-acetaminophen-caffeine (EXCEDRIN MIGRAINE) 250-250-65 MG tablet, Take 2 tablets by mouth every 6 (six) hours as needed for headache or migraine. , Disp: , Rfl:    atorvastatin (LIPITOR) 40 MG tablet, Take 40 mg by mouth daily., Disp: , Rfl:    budesonide-formoterol (SYMBICORT) 160-4.5 MCG/ACT inhaler, Inhale 2 puffs into the lungs in the morning and at bedtime., Disp: 1 each, Rfl: 12   chlorpheniramine (CHLOR-TRIMETON) 4 MG tablet, Take 4 mg by mouth 2 (two) times daily as needed for allergies., Disp: , Rfl:    diclofenac sodium (VOLTAREN) 1 % GEL, Apply 2 g topically 3 (three) times daily as needed (joint pain). , Disp: , Rfl:  fluticasone (FLONASE) 50 MCG/ACT nasal spray, Place 2 sprays into both nostrils daily as needed for allergies or rhinitis., Disp: , Rfl:    HYDROcodone-acetaminophen (NORCO/VICODIN) 5-325 MG tablet, Take 1 tablet by mouth daily as needed., Disp: , Rfl:    ipratropium (ATROVENT) 0.06 % nasal spray, Place 2 sprays into  both nostrils 2 (two) times a day., Disp: , Rfl:    Multiple Vitamin (MULTIVITAMIN) tablet, Take 1 tablet by mouth daily., Disp: , Rfl:    Spacer/Aero-Holding Chambers (AEROCHAMBER MV) inhaler, Use as instructed, Disp: 1 each, Rfl: 0   SUMAtriptan (IMITREX) 50 MG tablet, Take 50 mg by mouth every 2 (two) hours as needed for migraine. , Disp: , Rfl:    triamterene-hydrochlorothiazide (MAXZIDE-25) 37.5-25 MG per tablet, Take 1 tablet by mouth daily., Disp: , Rfl:    Subjective:   PATIENT ID: Paige Higgins GENDER: female DOB: [birth year], MRN: [id number]  Chief Complaint  Patient presents with   Follow-up    Wheezing and dry cough.     HPI  Paige Higgins is a pleasant 77 year old female presenting to clinic for the evaluation of cough and shortness of breath.  Patient has a past medical history of asthma for which she's seen pulmonology in the past. She was previously followed by Dr. Meredeth Ide over at [facility] and was also seen by Dr. Jayme Cloud in our office. She is looking to transition her care over to our office.  She reports symptoms of exertional dyspnea, in addition to cough that is productive of sputum. The sputum is white in color, and she denies any hemoptysis associated with it. She reports a wheeze that is sporadic and associated with the shortness of breath. Patient was previously prescribed Trelegy Ellipta, which she reports only using as needed. She feels that her symptoms are partially resolved with the inhaler. She has had PFT's in the past that are consistent with obstructive lung disease. She is not using her rescue inhaler.  During a previous visit with Dr. Jayme Cloud, a CBC and allergen panel were ordered, as well as PFT's. Patient failed to get her blood work done. Her FENO at that point was 30, and she was prescribed Breo which she did not use. Patient did have a CT scan of the chest in March of 2024 that showed patchy areas of ground-glass. This was performed in the  setting of Rhinovirus infection.  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 She reports a history of smoking briefly for a few years but quit in the late 80's. She denies any occupational exposures.  Ancillary information including prior medications, full medical/surgical/family/social histories, and PFTs (when available) are listed below and have been reviewed.   Review of Systems  Constitutional:  Negative for chills, fever and weight loss.  Respiratory:  Positive for cough, sputum production and shortness of breath. Negative for hemoptysis.   Cardiovascular:  Negative for chest pain.     Objective:   Vitals:   08/15/23 1036  BP: 128/82  Pulse: 85  Temp: 97.6 F (36.4 C)  TempSrc: Temporal  SpO2: 95%  Weight: 174 lb (78.9 kg)  Height: 5\' 2"  (1.575 m)   95% on RA  BMI Readings from Last 3 Encounters:  08/15/23 31.83 kg/m  03/06/23 30.55 kg/m  09/03/22 31.53 kg/m   Wt Readings from Last 3 Encounters:  08/15/23 174 lb (78.9 kg)  09/03/22 178 lb (80.7 kg)  04/24/21 176 lb 3.2 oz (79.9 kg)    Physical Exam Constitutional:  Appearance: Normal appearance.  HENT:     Nose: Nose normal.  Cardiovascular:     Rate and Rhythm: Normal rate and regular rhythm.     Pulses: Normal pulses.     Heart sounds: Normal heart sounds.  Pulmonary:     Effort: Pulmonary effort is normal.     Breath sounds: Normal breath sounds. No wheezing.  Neurological:     Mental Status: She is alert.     Ancillary Information    Past Medical History:  Diagnosis Date   Allergies    Arthritis    Asthma    Depression    Headache(784.0)    Heart murmur    Hyperlipidemia    Hypertension    PONV (postoperative nausea and vomiting)    Recurrent UTI    Sleep apnea    does not wear CPAP   Spondylolisthesis of lumbar region    Wears contact lenses      Family History  Problem Relation Age of Onset   Breast cancer Neg Hx      Past Surgical History:   Procedure Laterality Date   ABDOMINAL HYSTERECTOMY     ARTHROPLASTY  07/07/2012   LEFT SHOULDER   BREAST BIOPSY Left 2000   CARDIAC CATHETERIZATION     COLONOSCOPY     COLONOSCOPY WITH PROPOFOL N/A 02/03/2020   Procedure: COLONOSCOPY WITH PROPOFOL;  Surgeon: Toledo, Boykin Nearing, MD;  Location: [facility]COPY;  Service: Gastroenterology;  Laterality: N/A;   JOINT REPLACEMENT     bilateral knee replacements   LUMBAR LAMINECTOMY/DECOMPRESSION MICRODISCECTOMY N/A 04/28/2014   Procedure: LUMBAR THREE TO FOUR, LUMBAR FOUR TO FIVE  LAMINECTOMY/DECOMPRESSION MICRODISCECTOMY 2 LEVELS;  Surgeon: Cristi Loron, MD;  Location: [facility];  Service: Neurosurgery;  Laterality: N/A;  L34 L45 laminectomies   REVERSE SHOULDER ARTHROPLASTY  07/07/2012   Procedure: REVERSE SHOULDER ARTHROPLASTY;  Surgeon: Mable Paris, MD;  Location: [facility] OR;  Service: Orthopedics;  Laterality: Left;   REVERSE SHOULDER ARTHROPLASTY Right 03/10/2018   Procedure: REVERSE SHOULDER ARTHROPLASTY;  Surgeon: Christena Flake, MD;  Location: [facility] ORS;  Service: Orthopedics;  Laterality: Right;   SHOULDER ARTHROSCOPY     right shoulder    Social History   Socioeconomic History   Marital status: Widowed    Spouse name: Not on file   Number of children: Not on file   Years of education: Not on file   Highest education level: Not on file  Occupational History   Not on file  Tobacco Use   Smoking status: Former    Current packs/day: 0.00    Average packs/day: 0.5 packs/day for 8.0 years (4.0 ttl pk-yrs)    Types: Cigarettes    Start date: 01/08/1978    Quit date: 01/08/1986    Years since quitting: 37.6   Smokeless tobacco: Never  Vaping Use   Vaping status: Never Used  Substance and Sexual Activity   Alcohol use: No   Drug use: No   Sexual activity: Not Currently    Birth control/protection: Post-menopausal  Other Topics Concern   Not on file  Social History Narrative   Not on file   Social Determinants of  Health   Financial Resource Strain: Not on file  Food Insecurity: Not on file  Transportation Needs: Not on file  Physical Activity: Not on file  Stress: Not on file  Social Connections: Not on file  Intimate Partner Violence: Not on file     No Known Allergies   CBC  Component Value Date/Time   WBC 13.0 (H) 03/06/2023 1237   RBC 4.18 03/06/2023 1237   HGB 12.3 03/06/2023 1237   HGB 14.5 04/30/2021 0927   HCT 38.2 03/06/2023 1237   HCT 43.0 04/30/2021 0927   PLT 354 03/06/2023 1237   MCV 91.4 03/06/2023 1237   MCV 87 04/30/2021 0927   MCH 29.4 03/06/2023 1237   MCHC 32.2 03/06/2023 1237   RDW 14.0 03/06/2023 1237   RDW 12.3 04/30/2021 0927   LYMPHSABS 1.7 04/30/2021 0927   MONOABS 1.0 (H) 03/11/2018 0536   EOSABS 0.3 04/30/2021 0927   BASOSABS 0.1 04/30/2021 6433    Pulmonary Functions Testing Results:     No data to display          Outpatient Medications Prior to Visit  Medication Sig Dispense Refill   albuterol (PROVENTIL HFA;VENTOLIN HFA) 108 (90 BASE) MCG/ACT inhaler Inhale 2 puffs into the lungs every 4 (four) hours as needed. For wheezing.     Ascorbic Acid (VITAMIN C) 1000 MG tablet Take 1,000 mg by mouth daily.     aspirin EC 81 MG tablet Take 81 mg by mouth every 6 (six) hours as needed for moderate pain.      aspirin-acetaminophen-caffeine (EXCEDRIN MIGRAINE) 250-250-65 MG tablet Take 2 tablets by mouth every 6 (six) hours as needed for headache or migraine.      atorvastatin (LIPITOR) 40 MG tablet Take 40 mg by mouth daily.     chlorpheniramine (CHLOR-TRIMETON) 4 MG tablet Take 4 mg by mouth 2 (two) times daily as needed for allergies.     diclofenac sodium (VOLTAREN) 1 % GEL Apply 2 g topically 3 (three) times daily as needed (joint pain).      fluticasone (FLONASE) 50 MCG/ACT nasal spray Place 2 sprays into both nostrils daily as needed for allergies or rhinitis.     HYDROcodone-acetaminophen (NORCO/VICODIN) 5-325 MG tablet Take 1 tablet by mouth  daily as needed.     ipratropium (ATROVENT) 0.06 % nasal spray Place 2 sprays into both nostrils 2 (two) times a day.     Multiple Vitamin (MULTIVITAMIN) tablet Take 1 tablet by mouth daily.     SUMAtriptan (IMITREX) 50 MG tablet Take 50 mg by mouth every 2 (two) hours as needed for migraine.      triamterene-hydrochlorothiazide (MAXZIDE-25) 37.5-25 MG per tablet Take 1 tablet by mouth daily.     Fluticasone-Umeclidin-Vilant (TRELEGY ELLIPTA) 200-62.5-25 MCG/ACT AEPB Inhale 1 puff into the lungs daily.     BREO ELLIPTA 100-25 MCG/ACT AEPB Inhale 1 puff into the lungs daily. 60 each 3   No facility-administered medications prior to visit.

## 2023-08-19 ENCOUNTER — Ambulatory Visit
Admission: RE | Admit: 2023-08-19 | Discharge: 2023-08-19 | Disposition: A | Discharge status: Home / Self Care | Payer: Medicare Other | Attending: Student in an Organized Health Care Education/Training Program | Admitting: Student in an Organized Health Care Education/Training Program

## 2023-08-19 ENCOUNTER — Ambulatory Visit
Admission: RE | Admit: 2023-08-19 | Discharge: 2023-08-19 | Disposition: A | Discharge status: Home / Self Care | Payer: Medicare Other | Source: Ambulatory Visit | Attending: Student in an Organized Health Care Education/Training Program | Admitting: Student in an Organized Health Care Education/Training Program

## 2023-08-19 ENCOUNTER — Other Ambulatory Visit: Admit: 2023-08-19 | Payer: Medicare Other

## 2023-08-19 ENCOUNTER — Telehealth: Payer: Self-pay | Admitting: Student in an Organized Health Care Education/Training Program

## 2023-08-19 DIAGNOSIS — J4489 Other specified chronic obstructive pulmonary disease: Secondary | ICD-10-CM | POA: Diagnosis present

## 2023-08-19 MED ORDER — ALBUTEROL SULFATE HFA 108 (90 BASE) MCG/ACT IN AERS: 2.0000 | INHALATION_SPRAY | RESPIRATORY_TRACT | 12 refills | Status: AC | PRN

## 2023-08-19 NOTE — Telephone Encounter (Signed)
Pt. Having acute issues with Sob  and wants to get a chest Xray please advise pt.

## 2023-08-19 NOTE — Telephone Encounter (Signed)
CXR ordered as well as viral panel. Please ask the patient to come in and have both tests done. Sent refill on Albuterol to the pharmacy. Please ask her to use her Symbicort two puffs twice daily as well as every 4 hours as needed for the next two days in hopes of helping with symptoms. Thank you!

## 2023-08-19 NOTE — Telephone Encounter (Signed)
Patient is aware of below message/recommendations and voiced her understanding.  She will come in for panel and CXR.  Nothing further needed.

## 2023-08-19 NOTE — Telephone Encounter (Signed)
Called and spoke to patient.  She is requesting CXR.  She reports of chest congestion, increased SOB, non prod cough and wheezing x2d. Denied f/c/s or additional sx.  No recent covid test.  No supplemental oxygen. She does not monitor oxygen levels.   She used Symbicort BID. She is out of albuterol and request a refill. Preferred pharmacy is [facility].    Dr. Aundria Rud, please advise.

## 2023-08-20 ENCOUNTER — Telehealth: Payer: Self-pay | Admitting: Student in an Organized Health Care Education/Training Program

## 2023-08-20 ENCOUNTER — Other Ambulatory Visit
Admission: RE | Admit: 2023-08-20 | Discharge: 2023-08-20 | Disposition: A | Discharge status: Home / Self Care | Payer: Medicare Other | Attending: Student in an Organized Health Care Education/Training Program | Admitting: Student in an Organized Health Care Education/Training Program

## 2023-08-20 DIAGNOSIS — J4489 Other specified chronic obstructive pulmonary disease: Secondary | ICD-10-CM | POA: Diagnosis present

## 2023-08-20 LAB — RESPIRATORY PANEL BY PCR

## 2023-08-20 LAB — CBC WITH DIFFERENTIAL/PLATELET
Abs Immature Granulocytes: 0.03 10*3/uL (ref 0.00–0.07)
Basophils Absolute: 0.1 10*3/uL (ref 0.0–0.1)
Basophils Relative: 1 %
Eosinophils Absolute: 0.2 10*3/uL (ref 0.0–0.5)
Eosinophils Relative: 2 %
HCT: 43 % (ref 36.0–46.0)
Hemoglobin: 13.3 g/dL (ref 12.0–15.0)
Immature Granulocytes: 0 %
Lymphocytes Relative: 17 %
Lymphs Abs: 1.5 10*3/uL (ref 0.7–4.0)
MCH: 28 pg (ref 26.0–34.0)
MCHC: 30.9 g/dL (ref 30.0–36.0)
MCV: 90.5 fL (ref 80.0–100.0)
Monocytes Absolute: 0.5 10*3/uL (ref 0.1–1.0)
Monocytes Relative: 6 %
Neutro Abs: 6.2 10*3/uL (ref 1.7–7.7)
Neutrophils Relative %: 74 %
Platelets: 327 10*3/uL (ref 150–400)
RBC: 4.75 MIL/uL (ref 3.87–5.11)
RDW: 14 % (ref 11.5–15.5)
WBC: 8.4 10*3/uL (ref 4.0–10.5)
nRBC: 0 % (ref 0.0–0.2)

## 2023-08-20 NOTE — Telephone Encounter (Signed)
Spoke to patient. She is requesting CXR results. She is aware that CXR has not been read by radiologist yet.  She reports of continuous SOB. She feels that SOB has worsened over night and she is concerned.  She did not have resp panel yesterday but she will come by [facility] lab and do it today.  Dr. Virgil Benedict, will you review CXR? Thanks

## 2023-08-20 NOTE — Telephone Encounter (Signed)
FYI, Patient called for xray results- informed patient CMA would call once xray has been resulted. Patient states she has been having lots of shortness of breath and is concerned.

## 2023-08-22 LAB — ALLERGEN PANEL (27) + IGE
Alternaria Alternata IgE: <0.1 kU/L
Aspergillus Fumigatus IgE: <0.1 kU/L
Bahia Grass IgE: <0.1 kU/L
Bermuda Grass IgE: <0.1 kU/L
Cat Dander IgE: <0.1 kU/L
Cedar, Mountain IgE: <0.1 kU/L
Cladosporium Herbarum IgE: <0.1 kU/L
Cocklebur IgE: <0.1 kU/L
Cockroach, American IgE: <0.1 kU/L
Common Silver Birch IgE: <0.1 kU/L
D Farinae IgE: <0.1 kU/L
D Pteronyssinus IgE: <0.1 kU/L
Dog Dander IgE: <0.1 kU/L
Elm, American IgE: <0.1 kU/L
Hickory, White IgE: <0.1 kU/L
IgE (Immunoglobulin E), Serum: 17 [IU]/mL (ref 6–495)
Johnson Grass IgE: <0.1 kU/L
Kentucky Bluegrass IgE: <0.1 kU/L
Maple/Box Elder IgE: <0.1 kU/L
Mucor Racemosus IgE: <0.1 kU/L
Oak, White IgE: <0.1 kU/L
Penicillium Chrysogen IgE: <0.1 kU/L
Pigweed, Rough IgE: <0.1 kU/L
Plantain, English IgE: <0.1 kU/L
Ragweed, Short IgE: <0.1 kU/L
Setomelanomma Rostrat: <0.1 kU/L
Timothy Grass IgE: <0.1 kU/L
White Mulberry IgE: <0.1 kU/L

## 2023-08-22 NOTE — Telephone Encounter (Signed)
Pt. Called to get result from her covid test and her lab work please advise pt.

## 2023-08-22 NOTE — Telephone Encounter (Signed)
Spoke to the patient. Results overall normal and re-assuring. She feels better.

## 2023-08-25 NOTE — Telephone Encounter (Signed)
Noted  

## 2023-08-27 ENCOUNTER — Telehealth: Payer: Self-pay | Admitting: Student in an Organized Health Care Education/Training Program

## 2023-08-27 NOTE — Telephone Encounter (Signed)
Patient would like labwork results. Patient phone number is [phone].

## 2023-09-04 ENCOUNTER — Ambulatory Visit: Payer: Medicare Other | Admitting: Pulmonary Disease

## 2023-09-11 NOTE — Telephone Encounter (Signed)
Message from from 08/25/2023. The patient has not called back.  Nothing further needed.

## 2023-09-11 NOTE — Telephone Encounter (Signed)
I did discuss the results with the patient over the phone previously. Did she call again asking for further results?

## 2023-09-18 ENCOUNTER — Other Ambulatory Visit
Admission: RE | Admit: 2023-09-18 | Discharge: 2023-09-18 | Disposition: A | Discharge status: Home / Self Care | Payer: Medicare Other | Source: Ambulatory Visit | Attending: Nurse Practitioner | Admitting: Nurse Practitioner

## 2023-09-18 DIAGNOSIS — I110 Hypertensive heart disease with heart failure: Secondary | ICD-10-CM | POA: Diagnosis not present

## 2023-09-18 DIAGNOSIS — R0602 Shortness of breath: Secondary | ICD-10-CM | POA: Diagnosis not present

## 2023-09-18 DIAGNOSIS — R000 Tachycardia, unspecified: Secondary | ICD-10-CM | POA: Insufficient documentation

## 2023-09-18 DIAGNOSIS — R0609 Other forms of dyspnea: Secondary | ICD-10-CM | POA: Insufficient documentation

## 2023-09-18 LAB — D-DIMER, QUANTITATIVE: D-Dimer, Quant: 0.51 ug{FEU}/mL — ABNORMAL HIGH (ref 0.00–0.50)

## 2023-09-19 ENCOUNTER — Emergency Department: Payer: Medicare Other

## 2023-09-19 ENCOUNTER — Encounter: Payer: Self-pay | Admitting: Emergency Medicine

## 2023-09-19 ENCOUNTER — Other Ambulatory Visit: Payer: Self-pay

## 2023-09-19 ENCOUNTER — Inpatient Hospital Stay
Admission: EM | Admit: 2023-09-19 | Discharge: 2023-09-22 | DRG: 286 | Disposition: A | Discharge status: Home / Self Care | Payer: Medicare Other | Attending: Internal Medicine | Admitting: Internal Medicine

## 2023-09-19 DIAGNOSIS — Z79899 Other long term (current) drug therapy: Secondary | ICD-10-CM | POA: Diagnosis not present

## 2023-09-19 DIAGNOSIS — I509 Heart failure, unspecified: Secondary | ICD-10-CM | POA: Diagnosis not present

## 2023-09-19 DIAGNOSIS — J441 Chronic obstructive pulmonary disease with (acute) exacerbation: Secondary | ICD-10-CM | POA: Diagnosis present

## 2023-09-19 DIAGNOSIS — I2510 Atherosclerotic heart disease of native coronary artery without angina pectoris: Secondary | ICD-10-CM | POA: Diagnosis present

## 2023-09-19 DIAGNOSIS — I10 Essential (primary) hypertension: Secondary | ICD-10-CM | POA: Diagnosis not present

## 2023-09-19 DIAGNOSIS — Z96653 Presence of artificial knee joint, bilateral: Secondary | ICD-10-CM | POA: Diagnosis present

## 2023-09-19 DIAGNOSIS — Z7951 Long term (current) use of inhaled steroids: Secondary | ICD-10-CM | POA: Diagnosis not present

## 2023-09-19 DIAGNOSIS — R0602 Shortness of breath: Secondary | ICD-10-CM | POA: Diagnosis present

## 2023-09-19 DIAGNOSIS — Z7952 Long term (current) use of systemic steroids: Secondary | ICD-10-CM | POA: Diagnosis not present

## 2023-09-19 DIAGNOSIS — Z96611 Presence of right artificial shoulder joint: Secondary | ICD-10-CM | POA: Diagnosis present

## 2023-09-19 DIAGNOSIS — I5023 Acute on chronic systolic (congestive) heart failure: Secondary | ICD-10-CM | POA: Diagnosis present

## 2023-09-19 DIAGNOSIS — R519 Headache, unspecified: Secondary | ICD-10-CM | POA: Diagnosis present

## 2023-09-19 DIAGNOSIS — Z90710 Acquired absence of both cervix and uterus: Secondary | ICD-10-CM | POA: Diagnosis not present

## 2023-09-19 DIAGNOSIS — Z87891 Personal history of nicotine dependence: Secondary | ICD-10-CM | POA: Diagnosis not present

## 2023-09-19 DIAGNOSIS — E785 Hyperlipidemia, unspecified: Secondary | ICD-10-CM | POA: Diagnosis present

## 2023-09-19 DIAGNOSIS — M1990 Unspecified osteoarthritis, unspecified site: Secondary | ICD-10-CM | POA: Diagnosis present

## 2023-09-19 DIAGNOSIS — I051 Rheumatic mitral insufficiency: Secondary | ICD-10-CM | POA: Diagnosis present

## 2023-09-19 DIAGNOSIS — I110 Hypertensive heart disease with heart failure: Principal | ICD-10-CM | POA: Diagnosis present

## 2023-09-19 DIAGNOSIS — I2729 Other secondary pulmonary hypertension: Secondary | ICD-10-CM | POA: Diagnosis present

## 2023-09-19 DIAGNOSIS — Z91199 Patient's noncompliance with other medical treatment and regimen due to unspecified reason: Secondary | ICD-10-CM | POA: Diagnosis not present

## 2023-09-19 DIAGNOSIS — Z96612 Presence of left artificial shoulder joint: Secondary | ICD-10-CM | POA: Diagnosis present

## 2023-09-19 DIAGNOSIS — Z1152 Encounter for screening for COVID-19: Secondary | ICD-10-CM

## 2023-09-19 DIAGNOSIS — J45901 Unspecified asthma with (acute) exacerbation: Secondary | ICD-10-CM

## 2023-09-19 DIAGNOSIS — Z87440 Personal history of urinary (tract) infections: Secondary | ICD-10-CM

## 2023-09-19 DIAGNOSIS — Z7982 Long term (current) use of aspirin: Secondary | ICD-10-CM | POA: Diagnosis not present

## 2023-09-19 DIAGNOSIS — G4733 Obstructive sleep apnea (adult) (pediatric): Secondary | ICD-10-CM | POA: Diagnosis present

## 2023-09-19 DIAGNOSIS — E669 Obesity, unspecified: Secondary | ICD-10-CM | POA: Diagnosis present

## 2023-09-19 DIAGNOSIS — I5021 Acute systolic (congestive) heart failure: Secondary | ICD-10-CM | POA: Diagnosis not present

## 2023-09-19 LAB — TSH: TSH: 2.106 u[IU]/mL (ref 0.350–4.500)

## 2023-09-19 LAB — BASIC METABOLIC PANEL
Anion gap: 11 (ref 5–15)
BUN: 21 mg/dL (ref 8–23)
CO2: 25 mmol/L (ref 22–32)
Calcium: 8.6 mg/dL — ABNORMAL LOW (ref 8.9–10.3)
Chloride: 102 mmol/L (ref 98–111)
Creatinine, Ser: 0.89 mg/dL (ref 0.44–1.00)
GFR, Estimated: >60 mL/min (ref >60)
Glucose, Bld: 112 mg/dL — ABNORMAL HIGH (ref 70–99)
Potassium: 3.5 mmol/L (ref 3.5–5.1)
Sodium: 138 mmol/L (ref 135–145)

## 2023-09-19 LAB — BRAIN NATRIURETIC PEPTIDE: B Natriuretic Peptide: 611 pg/mL — ABNORMAL HIGH (ref 0.0–100.0)

## 2023-09-19 LAB — RESPIRATORY PANEL BY PCR

## 2023-09-19 LAB — CBC
HCT: 42.7 % (ref 36.0–46.0)
Hemoglobin: 13.4 g/dL (ref 12.0–15.0)
MCH: 28.3 pg (ref 26.0–34.0)
MCHC: 31.4 g/dL (ref 30.0–36.0)
MCV: 90.3 fL (ref 80.0–100.0)
Platelets: 359 10*3/uL (ref 150–400)
RBC: 4.73 MIL/uL (ref 3.87–5.11)
RDW: 13.8 % (ref 11.5–15.5)
WBC: 8.4 10*3/uL (ref 4.0–10.5)
nRBC: 0 % (ref 0.0–0.2)

## 2023-09-19 LAB — RESP PANEL BY RT-PCR (RSV, FLU A&B, COVID)  RVPGX2
Influenza A by PCR: NEGATIVE
Influenza B by PCR: NEGATIVE
Resp Syncytial Virus by PCR: NEGATIVE
SARS Coronavirus 2 by RT PCR: NEGATIVE

## 2023-09-19 LAB — TROPONIN I (HIGH SENSITIVITY)
Troponin I (High Sensitivity): 38 ng/L — ABNORMAL HIGH (ref <18)
Troponin I (High Sensitivity): 40 ng/L — ABNORMAL HIGH (ref <18)

## 2023-09-19 MED ORDER — FUROSEMIDE 10 MG/ML IJ SOLN
40.0000 mg | Freq: Every day | INTRAMUSCULAR | Status: DC
  Administered 2023-09-20 – 2023-09-22 (×3): 40 mg via INTRAVENOUS
  Filled 2023-09-19 (×3): qty 4

## 2023-09-19 MED ORDER — IOHEXOL 350 MG/ML SOLN
75.0000 mL | Freq: Once | INTRAVENOUS | Status: AC | PRN
  Administered 2023-09-19: 75 mL via INTRAVENOUS

## 2023-09-19 MED ORDER — IPRATROPIUM-ALBUTEROL 0.5-2.5 (3) MG/3ML IN SOLN
3.0000 mL | RESPIRATORY_TRACT | Status: DC | PRN
  Administered 2023-09-21: 3 mL via RESPIRATORY_TRACT
  Filled 2023-09-19: qty 3

## 2023-09-19 MED ORDER — MOMETASONE FURO-FORMOTEROL FUM 200-5 MCG/ACT IN AERO
2.0000 | INHALATION_SPRAY | Freq: Two times a day (BID) | RESPIRATORY_TRACT | Status: DC
  Administered 2023-09-20 – 2023-09-22 (×4): 2 via RESPIRATORY_TRACT
  Filled 2023-09-19 (×2): qty 8.8

## 2023-09-19 MED ORDER — ENOXAPARIN SODIUM 40 MG/0.4ML IJ SOSY
40.0000 mg | PREFILLED_SYRINGE | INTRAMUSCULAR | Status: DC
  Administered 2023-09-19 – 2023-09-21 (×3): 40 mg via SUBCUTANEOUS
  Filled 2023-09-19 (×3): qty 0.4

## 2023-09-19 MED ORDER — ATORVASTATIN CALCIUM 20 MG PO TABS
40.0000 mg | ORAL_TABLET | Freq: Every day | ORAL | Status: DC
  Administered 2023-09-20 – 2023-09-22 (×3): 40 mg via ORAL
  Filled 2023-09-19 (×3): qty 2

## 2023-09-19 MED ORDER — FUROSEMIDE 10 MG/ML IJ SOLN
40.0000 mg | Freq: Once | INTRAMUSCULAR | Status: AC
  Administered 2023-09-19: 40 mg via INTRAVENOUS
  Filled 2023-09-19: qty 4

## 2023-09-19 MED ORDER — HYDROCODONE-ACETAMINOPHEN 5-325 MG PO TABS: 1.0000 | ORAL_TABLET | Freq: Every day | ORAL | Status: DC | PRN

## 2023-09-19 MED ORDER — IPRATROPIUM-ALBUTEROL 0.5-2.5 (3) MG/3ML IN SOLN
3.0000 mL | Freq: Once | RESPIRATORY_TRACT | Status: AC
  Administered 2023-09-19: 3 mL via RESPIRATORY_TRACT
  Filled 2023-09-19: qty 3

## 2023-09-19 MED ORDER — PREDNISONE 10 MG PO TABS
10.0000 mg | ORAL_TABLET | Freq: Every day | ORAL | Status: AC
  Administered 2023-09-20 – 2023-09-21 (×2): 10 mg via ORAL
  Filled 2023-09-19 (×2): qty 1

## 2023-09-19 MED ORDER — SODIUM CHLORIDE 0.9% FLUSH: 3.0000 mL | INTRAVENOUS | Status: DC | PRN

## 2023-09-19 MED ORDER — MONTELUKAST SODIUM 10 MG PO TABS
10.0000 mg | ORAL_TABLET | Freq: Every day | ORAL | Status: DC
  Administered 2023-09-19 – 2023-09-21 (×3): 10 mg via ORAL
  Filled 2023-09-19 (×3): qty 1

## 2023-09-19 MED ORDER — SODIUM CHLORIDE 0.9% FLUSH
3.0000 mL | Freq: Two times a day (BID) | INTRAVENOUS | Status: DC
  Administered 2023-09-19 – 2023-09-22 (×4): 3 mL via INTRAVENOUS

## 2023-09-19 MED ORDER — PANTOPRAZOLE SODIUM 40 MG PO TBEC
40.0000 mg | DELAYED_RELEASE_TABLET | Freq: Every day | ORAL | Status: DC
  Administered 2023-09-19 – 2023-09-22 (×4): 40 mg via ORAL
  Filled 2023-09-19 (×4): qty 1

## 2023-09-19 MED ORDER — BUTALBITAL-APAP-CAFFEINE 50-325-40 MG PO TABS
1.0000 | ORAL_TABLET | Freq: Four times a day (QID) | ORAL | Status: DC | PRN
  Administered 2023-09-19: 1 via ORAL
  Filled 2023-09-19: qty 1

## 2023-09-19 MED ORDER — ASPIRIN 81 MG PO TBEC
81.0000 mg | DELAYED_RELEASE_TABLET | Freq: Every day | ORAL | Status: DC
  Administered 2023-09-20 – 2023-09-21 (×2): 81 mg via ORAL
  Filled 2023-09-19 (×3): qty 1

## 2023-09-19 MED ORDER — ACETAMINOPHEN 325 MG PO TABS: 650.0000 mg | ORAL_TABLET | ORAL | Status: DC | PRN

## 2023-09-19 MED ORDER — DOXYCYCLINE HYCLATE 100 MG PO TABS
100.0000 mg | ORAL_TABLET | Freq: Two times a day (BID) | ORAL | Status: AC
  Administered 2023-09-19 – 2023-09-21 (×4): 100 mg via ORAL
  Filled 2023-09-19 (×4): qty 1

## 2023-09-19 MED ORDER — ALBUTEROL SULFATE (2.5 MG/3ML) 0.083% IN NEBU
2.5000 mg | INHALATION_SOLUTION | RESPIRATORY_TRACT | Status: DC | PRN
  Administered 2023-09-20 – 2023-09-22 (×2): 2.5 mg via RESPIRATORY_TRACT
  Filled 2023-09-19: qty 3

## 2023-09-19 MED ORDER — ONDANSETRON HCL 4 MG/2ML IJ SOLN: 4.0000 mg | Freq: Four times a day (QID) | INTRAMUSCULAR | Status: DC | PRN

## 2023-09-19 NOTE — Assessment & Plan Note (Addendum)
Patient is presenting with nearly 2 weeks of dyspnea on exertion that was initially attributed to an asthma exacerbation but symptoms did not improve with antibiotics and prednisone.  CTA today demonstrates significant cardiomegaly (non-gated though) with severe dilation of the LV with reflux of contrast into the IVC and hepatic veins with evidence of pulmonary edema concerning for acute heart failure exacerbation.  Last echocardiogram in 2022 with no evidence of heart failure.  Uncertain etiology, however differential includes nonischemic cardiomyopathy including possible viral etiology.  LHC in 2011 with normal coronaries and Myoview in 2017 with no ischemia  - Cardiology consulted; appreciate their recommendations - Telemetry monitoring - Start Lasix 40 mg IV daily - Strict ins and out - Daily weights - Echocardiogram ordered - Full viral panel - TSH

## 2023-09-19 NOTE — ED Triage Notes (Signed)
Pt via POV from home. Pt c/o SOB, productive cough and mid to upper back pain. Cardiology wanted her to come in for possible PNA or PE. Pt SOB has been going on for a week and half. Denies any hx of CHF/COPD. Reports hx of asthma. Pt is A&Ox4 and NAD, ambulatory to triage.

## 2023-09-19 NOTE — H&P (Addendum)
History and Physical    Patient: Paige Higgins WUJ:[id number] DOB: Apr 22, 1946 DOA: 09/19/2023 DOS: the patient was seen and examined on 09/19/2023 PCP: Paige Mina, MD  Patient coming from: Home  Chief Complaint:  Chief Complaint  Patient presents with   Shortness of Breath   HPI: Paige Higgins is a 77 y.o. female with medical history significant of asthmatic bronchitis with recent acute exacerbation, hypertension, OSA noncompliant with CPAP, chronic sinus tachycardia, hyperlipidemia, who presents to the ED due to shortness of breath.  Mrs. Level states that around the beginning of the month, she began to experience a productive cough with chest congestion, rhinorrhea, sinus pressure and headache.  She had a televisit with her pulmonologist who prescribed her prednisone and doxycycline.  Despite this, her symptoms did not improve and she began to experience shortness of breath on exertion.  She denies any lower extremity swelling with the exception of when she has been sitting for a long time.  She notes that she has been having some congestion in her chest when she lays down but this has been going on for several months.  She denies any chest pain or palpitations.  She denies any history of congestive heart disease.  ED course: On arrival to the ED, patient was hypertensive at 152/82 with heart rate of 58.  She was saturating at 100% on room air.  She was afebrile at 98.4.  Initial workup notable for normal CBC, BMP with glucose of 112.  Initial troponin elevated at 38 with flat trend to 40.  COVID-19, RSV and influenza PCR negative.  Chest x-ray was obtained and then CTA which demonstrated significant cardiomegaly with severe dilation of the left ventricle with contrast reflux into the IVC and hepatic veins concerning for right heart failure in addition to pulmonary interstitial edema with small right pleural effusion.  Patient received DuoNebs and IV Lasix.  TRH contacted for  admission.  Review of Systems: As mentioned in the history of present illness. All other systems reviewed and are negative.  Past Medical History:  Diagnosis Date   Allergies    Arthritis    Asthma    Depression    Headache(784.0)    Heart murmur    Hyperlipidemia    Hypertension    PONV (postoperative nausea and vomiting)    Recurrent UTI    Sleep apnea    does not wear CPAP   Spondylolisthesis of lumbar region    Wears contact lenses    Past Surgical History:  Procedure Laterality Date   ABDOMINAL HYSTERECTOMY     ARTHROPLASTY  07/07/2012   LEFT SHOULDER   BREAST BIOPSY Left 2000   CARDIAC CATHETERIZATION     COLONOSCOPY     COLONOSCOPY WITH PROPOFOL N/A 02/03/2020   Procedure: COLONOSCOPY WITH PROPOFOL;  Surgeon: Toledo, Boykin Nearing, MD;  Location: [facility];  Service: Gastroenterology;  Laterality: N/A;   JOINT REPLACEMENT     bilateral knee replacements   LUMBAR LAMINECTOMY/DECOMPRESSION MICRODISCECTOMY N/A 04/28/2014   Procedure: LUMBAR THREE TO FOUR, LUMBAR FOUR TO FIVE  LAMINECTOMY/DECOMPRESSION MICRODISCECTOMY 2 LEVELS;  Surgeon: Cristi Loron, MD;  Location: [facility];  Service: Neurosurgery;  Laterality: N/A;  L34 L45 laminectomies   REVERSE SHOULDER ARTHROPLASTY  07/07/2012   Procedure: REVERSE SHOULDER ARTHROPLASTY;  Surgeon: Mable Paris, MD;  Location: [facility] OR;  Service: Orthopedics;  Laterality: Left;   REVERSE SHOULDER ARTHROPLASTY Right 03/10/2018   Procedure: REVERSE SHOULDER ARTHROPLASTY;  Surgeon: Christena Flake, MD;  Location: [facility] ORS;  Service: Orthopedics;  Laterality: Right;   SHOULDER ARTHROSCOPY     right shoulder   Social History:  reports that she quit smoking about 37 years ago. Her smoking use included cigarettes. She started smoking about 45 years ago. She has a 4 pack-year smoking history. She has never used smokeless tobacco. She reports that she does not drink alcohol and does not use drugs.  No Known Allergies  Family  History  Problem Relation Age of Onset   Breast cancer Neg Hx     Prior to Admission medications   Medication Sig Start Date End Date Taking? Authorizing Provider  albuterol (VENTOLIN HFA) 108 (90 Base) MCG/ACT inhaler Inhale 2 puffs into the lungs every 4 (four) hours as needed. For wheezing. 08/19/23   Raechel Chute, MD  Ascorbic Acid (VITAMIN C) 1000 MG tablet Take 1,000 mg by mouth daily.    [provider]  aspirin EC 81 MG tablet Take 81 mg by mouth every 6 (six) hours as needed for moderate pain.     [provider]  aspirin-acetaminophen-caffeine (EXCEDRIN MIGRAINE) [phone] MG tablet Take 2 tablets by mouth every 6 (six) hours as needed for headache or migraine.     [provider]  atorvastatin (LIPITOR) 40 MG tablet Take 40 mg by mouth daily.    [provider]  budesonide-formoterol (SYMBICORT) 160-4.5 MCG/ACT inhaler Inhale 2 puffs into the lungs in the morning and at bedtime. 08/15/23   Raechel Chute, MD  chlorpheniramine (CHLOR-TRIMETON) 4 MG tablet Take 4 mg by mouth 2 (two) times daily as needed for allergies.    [provider]  diclofenac sodium (VOLTAREN) 1 % GEL Apply 2 g topically 3 (three) times daily as needed (joint pain).     [provider]  fluticasone (FLONASE) 50 MCG/ACT nasal spray Place 2 sprays into both nostrils daily as needed for allergies or rhinitis.    [provider]  HYDROcodone-acetaminophen (NORCO/VICODIN) 5-325 MG tablet Take 1 tablet by mouth daily as needed. 12/07/18   [provider]  ipratropium (ATROVENT) 0.06 % nasal spray Place 2 sprays into both nostrils 2 (two) times a day. 05/06/18   [provider]  Multiple Vitamin (MULTIVITAMIN) tablet Take 1 tablet by mouth daily.    [provider]  Spacer/Aero-Holding Chambers (AEROCHAMBER MV) inhaler Use as instructed 08/15/23   Raechel Chute, MD  SUMAtriptan (IMITREX) 50 MG tablet Take 50 mg by mouth every 2 (two)  hours as needed for migraine.     [provider]  triamterene-hydrochlorothiazide (MAXZIDE-25) 37.5-25 MG per tablet Take 1 tablet by mouth daily.    [provider]    Physical Exam: Vitals:   09/19/23 1230 09/19/23 1415 09/19/23 1630 09/19/23 1700  BP: 109/78 114/75 124/89 (!) 110/93  Pulse: (!) 147 95 95 (!) 105  Resp: 19 13 16 19   Temp:  98 F (36.7 C)    TempSrc:  Oral    SpO2: 99% 100% 100% 96%  Weight:      Height:       Physical Exam Vitals and nursing note reviewed.  Constitutional:      General: She is not in acute distress.    Appearance: She is obese. She is not toxic-appearing.  HENT:     Head: Normocephalic and atraumatic.  Eyes:     Extraocular Movements: Extraocular movements intact.     Pupils: Pupils are equal, round, and reactive to light.  Neck:  Vascular: JVD present.  Cardiovascular:     Rate and Rhythm: Normal rate and regular rhythm.  Pulmonary:     Effort: Pulmonary effort is normal. No tachypnea.     Breath sounds: No wheezing.     Comments: Coarse breath sounds in the bases, right more than left Abdominal:     Palpations: Abdomen is soft.     Tenderness: There is no abdominal tenderness.  Musculoskeletal:     Right lower leg: No edema.     Left lower leg: No edema.  Skin:    General: Skin is warm and dry.  Neurological:     General: No focal deficit present.     Mental Status: She is alert and oriented to person, place, and time.     Motor: No weakness.  Psychiatric:        Mood and Affect: Mood normal.        Behavior: Behavior normal.    Data Reviewed: CBC with WBC of 8.4, hemoglobin of 13.4, platelets of 359 BMP with sodium of 138, potassium 3.5, bicarb 25, glucose 112, BUN 21, creatinine 0.89 GFR above 60 Troponin elevated at 38 with flat trend to 40  COVID-19, influenza and RSV PCR negative  D-dimer obtained cardiology office yesterday minimally elevated at 0.51  EKG personally reviewed.  Sinus rhythm  with rate of 107.  PACs noted.  No ST or T wave changes concerning for acute ischemia.  CT Angio Chest PE W and/or Wo Contrast  Result Date: 09/19/2023 CLINICAL DATA:  Shortness of breath and productive cough. EXAM: CT ANGIOGRAPHY CHEST WITH CONTRAST TECHNIQUE: Multidetector CT imaging of the chest was performed using the standard protocol during bolus administration of intravenous contrast. Multiplanar CT image reconstructions and MIPs were obtained to evaluate the vascular anatomy. RADIATION DOSE REDUCTION: This exam was performed according to the departmental dose-optimization program which includes automated exposure control, adjustment of the mA and/or kV according to patient size and/or use of iterative reconstruction technique. CONTRAST:  75mL OMNIPAQUE IOHEXOL 350 MG/ML SOLN COMPARISON:  CT a of the chest on 03/06/2023 FINDINGS: Cardiovascular: The pulmonary arteries are adequately opacified. There is no evidence of pulmonary embolism. Central pulmonary arteries are moderately dilated with the main pulmonary artery measuring up to 4.1 cm. The heart is significantly enlarged with severe dilatation of the left ventricle. Reflux of contrast into the IVC and hepatic veins is consistent with some degree of right heart failure. No pericardial fluid identified. Calcified coronary artery plaque present primarily in the distribution of the LAD. The thoracic aorta demonstrates atherosclerosis without aneurysmal disease. Mediastinum/Nodes: No enlarged mediastinal, hilar, or axillary lymph nodes. Thyroid gland, trachea, and esophagus demonstrate no significant findings. Lungs/Pleura: Lungs demonstrate pulmonary interstitial edema and pulmonary venous hypertension. There is a small right pleural effusion and trace left pleural fluid. No focal airspace consolidation, pulmonary mass or pneumothorax. Upper Abdomen: No acute abnormality. Musculoskeletal: Diffuse degenerative disc disease of the spine. No fractures or  bony lesions identified. Review of the MIP images confirms the above findings. IMPRESSION: 1. No evidence of pulmonary embolism. 2. Significant cardiomegaly with severe dilatation of the left ventricle. Reflux of contrast into the IVC and hepatic veins is consistent with some degree of right heart failure. Correlation with echocardiography may be helpful. 3. Pulmonary interstitial edema and pulmonary venous hypertension with small right pleural effusion and trace left pleural fluid. 4. Dilated central pulmonary arteries consistent with pulmonary arterial hypertension. 5. Calcified coronary artery plaque primarily in the distribution of the  LAD. 6. Aortic atherosclerosis without aneurysmal disease. Aortic Atherosclerosis (ICD10-I70.0). Electronically Signed   By: Irish Lack M.D.   On: 09/19/2023 14:51   DG Chest 2 View  Result Date: 09/19/2023 CLINICAL DATA:  Chest pain.  Shortness of breath.  Productive cough. EXAM: CHEST - 2 VIEW COMPARISON:  08/19/2023. FINDINGS: Redemonstration of left retrocardiac opacity partially obscuring left hemidiaphragm and blunting the left lateral costophrenic angle, favoring combination of left lung lower lobe atelectasis and/or consolidation with small pleural effusion. No significant interval change since the prior study. Bilateral lung fields are otherwise clear. Right lateral costophrenic angle is clear. Stable moderately enlarged cardio-mediastinal silhouette. Presumed cardiac monitor is seen overlying the left upper anterior chest wall. No acute osseous abnormalities. 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 The soft tissues are within normal limits. IMPRESSION: 1. Stable left retrocardiac opacity, favoring combination of left lung lower lobe atelectasis and/or consolidation with small pleural effusion. 2. Stable cardiomegaly. Electronically Signed   By: Jules Schick M.D.   On: 09/19/2023 10:36    Results are pending, will review when  available.  Assessment and Plan:  * Acute heart failure (HCC) Patient is presenting with nearly 2 weeks of dyspnea on exertion that was initially attributed to an asthma exacerbation but symptoms did not improve with antibiotics and prednisone.  CTA today demonstrates significant cardiomegaly (non-gated though) with severe dilation of the LV with reflux of contrast into the IVC and hepatic veins with evidence of pulmonary edema concerning for acute heart failure exacerbation.  Last echocardiogram in 2022 with no evidence of heart failure.  Uncertain etiology, however differential includes nonischemic cardiomyopathy including possible viral etiology.  LHC in 2011 with normal coronaries and Myoview in 2017 with no ischemia  - Cardiology consulted; appreciate their recommendations - Telemetry monitoring - Start Lasix 40 mg IV daily - Strict ins and out - Daily weights - Echocardiogram ordered - Full viral panel - TSH  Asthma exacerbation Patient had a televisit on 10/03 with her pulmonology office reporting chest congestion cough wheezing and rhinorrhea at which time she was prescribed prednisone and doxycycline.  - Continue doxycycline to complete 10-day course as prescribed - Continue prednisone to complete 10-day course as prescribed - Continue DuoNebs as needed - Continue home bronchodilators  Headache Patient is on sumatriptan at home, however will need to avoid in the setting of acute heart failure.  - Fioricet as needed  OSA (obstructive sleep apnea) Per chart review, patient is noncompliant with her home CPAP.  Essential hypertension - Hold home Maxide given need for IV diuresis  Advance Care Planning:   Code Status: Full Code   Consults: Cardiology  Family Communication: Patient's husband updated at bedside  Severity of Illness: The appropriate patient status for this patient is INPATIENT. Inpatient status is judged to be reasonable and necessary in order to provide  the required intensity of service to ensure the patient's safety. The patient's presenting symptoms, physical exam findings, and initial radiographic and laboratory data in the context of their chronic comorbidities is felt to place them at high risk for further clinical deterioration. Furthermore, it is not anticipated that the patient will be medically stable for discharge from the hospital within 2 midnights of admission.   * I certify that at the point of admission it is my clinical judgment that the patient will require inpatient hospital care spanning beyond 2 midnights from the point of admission due to high intensity of service, high risk for further deterioration  and high frequency of surveillance required.*  Author: Verdene Lennert, MD 09/19/2023 6:49 PM  For on call review [email or web address].

## 2023-09-19 NOTE — ED Notes (Signed)
"  ED Happy meal" provided

## 2023-09-19 NOTE — Assessment & Plan Note (Signed)
Per chart review, patient is noncompliant with her home CPAP.

## 2023-09-19 NOTE — ED Provider Notes (Signed)
[facility] Provider Note    Event Date/Time   First MD Initiated Contact with Patient 09/19/23 1154     (approximate)   History   Shortness of Breath   HPI  Paige Higgins is a 77 y.o. female with history of hypertension, hyperlipidemia, OSA, COPD who comes in with shortness of breath.  I reviewed the office visit where she complained of worsening dyspnea on exertion.  She saw her PCP and started on doxycycline and prednisone and is still currently taking them.  Her cardiologist saw her yesterday and wanted her to be evaluated for pneumonia or PE.  I reviewed the note worse where patient had a D-dimer that was positive which is why she was sent over here.  She reports having a DuoNeb in the waiting room and reports feeling much improved at this time.  She denies any falls or hitting her head.  Denies any abdominal pain.  Reviewed the cardiology note from yesterday  Physical Exam   Triage Vital Signs: ED Triage Vitals  Encounter Vitals Group     BP 09/19/23 0910 (!) 152/82     Systolic BP Percentile --      Diastolic BP Percentile --      Pulse Rate 09/19/23 0910 (!) 58     Resp 09/19/23 0910 20     Temp 09/19/23 0910 98.4 F (36.9 C)     Temp Source 09/19/23 0910 Oral     SpO2 09/19/23 0910 100 %     Weight 09/19/23 0904 165 lb (74.8 kg)     Height 09/19/23 0904 5\' 2"  (1.575 m)     Head Circumference --      Peak Flow --      Pain Score 09/19/23 0904 2     Pain Loc --      Pain Education --      Exclude from Growth Chart --     Most recent vital signs: Vitals:   09/19/23 0910  BP: (!) 152/82  Pulse: (!) 58  Resp: 20  Temp: 98.4 F (36.9 C)  SpO2: 100%     General: Awake, no distress.  CV:  Good peripheral perfusion.  Resp:  Normal effort.  No wheezing noted Abd:  No distention.  Other:  No swelling in legs   ED Results / Procedures / Treatments   Labs (all labs ordered are listed, but only abnormal results are  displayed) Labs Reviewed  BASIC METABOLIC PANEL - Abnormal; Notable for the following components:      Result Value   Glucose, Bld 112 (*)    Calcium 8.6 (*)    All other components within normal limits  TROPONIN I (HIGH SENSITIVITY) - Abnormal; Notable for the following components:   Troponin I (High Sensitivity) 38 (*)    All other components within normal limits  CBC  TROPONIN I (HIGH SENSITIVITY)     EKG  My interpretation of EKG:  Sinus tachycardia rate of 107 without any ST elevation or T wave inversions, incomplete right bundle-branch block  RADIOLOGY I have reviewed the xray personally and interpreted patient has possible atelectasis versus consolidation as well as cardiomegaly. PROCEDURES:  Critical Care performed: No  .1-3 Lead EKG Interpretation  Performed by: Concha Se, MD Authorized by: Concha Se, MD     Interpretation: normal      MEDICATIONS ORDERED IN ED: Medications  ipratropium-albuterol (DUONEB) 0.5-2.5 (3) MG/3ML nebulizer solution 3 mL (3 mLs Nebulization Given 09/19/23  1142)     IMPRESSION / MDM / ASSESSMENT AND PLAN / ED COURSE  I reviewed the triage vital signs and the nursing notes.   Patient's presentation is most consistent with acute presentation with potential threat to life or bodily function.   Patient comes in with shortness of breath send due to elevated D-dimer will get CT PE to evaluate for PE, pneumonia.  Will get COVID test.  Patient has no wheezing at this time but did just get a DuoNeb in triage.  She has an echo that is been ordered by her cardiologist.  Troponin elevated at 38.  BMP reassuring CBC reassuring  CT pe negative but does have cardiomeg and pulm edema   D/w pt will admit for echo, iv diuresis given new onset HF  The patient is on the cardiac monitor to evaluate for evidence of arrhythmia and/or significant heart rate changes.      FINAL CLINICAL IMPRESSION(S) / ED DIAGNOSES   Final diagnoses:   Acute congestive heart failure, unspecified heart failure type (HCC)     Rx / DC Orders   ED Discharge Orders     None        Note:  This document was prepared using Dragon voice recognition software and may include unintentional dictation errors.   Concha Se, MD 09/19/23 [phone]

## 2023-09-19 NOTE — Assessment & Plan Note (Signed)
Patient is on sumatriptan at home, however will need to avoid in the setting of acute heart failure.  - Fioricet as needed

## 2023-09-19 NOTE — Assessment & Plan Note (Addendum)
Patient had a televisit on 10/03 with her pulmonology office reporting chest congestion cough wheezing and rhinorrhea at which time she was prescribed prednisone and doxycycline.  - Continue doxycycline to complete 10-day course as prescribed - Continue prednisone to complete 10-day course as prescribed - Continue DuoNebs as needed - Continue home bronchodilators

## 2023-09-19 NOTE — ED Notes (Signed)
Pt had come to desk and says she was due for nebulizer and wanted to go home to do it.  I told her we would do it here.  She is in triage 3 doing the neb now.

## 2023-09-19 NOTE — Assessment & Plan Note (Signed)
-   Hold home Maxide given need for IV diuresis

## 2023-09-19 NOTE — ED Notes (Signed)
Hospitalist at bedside 

## 2023-09-20 ENCOUNTER — Inpatient Hospital Stay
Admit: 2023-09-20 | Discharge: 2023-09-20 | Disposition: A | Discharge status: Home / Self Care | Payer: Medicare Other | Attending: Internal Medicine | Admitting: Internal Medicine

## 2023-09-20 DIAGNOSIS — I5021 Acute systolic (congestive) heart failure: Secondary | ICD-10-CM | POA: Diagnosis not present

## 2023-09-20 DIAGNOSIS — J45901 Unspecified asthma with (acute) exacerbation: Secondary | ICD-10-CM | POA: Diagnosis not present

## 2023-09-20 DIAGNOSIS — I509 Heart failure, unspecified: Secondary | ICD-10-CM

## 2023-09-20 LAB — CBC WITH DIFFERENTIAL/PLATELET
Abs Immature Granulocytes: 0.03 10*3/uL (ref 0.00–0.07)
Basophils Absolute: 0.1 10*3/uL (ref 0.0–0.1)
Basophils Relative: 1 %
Eosinophils Absolute: 0.2 10*3/uL (ref 0.0–0.5)
Eosinophils Relative: 3 %
HCT: 41.3 % (ref 36.0–46.0)
Hemoglobin: 13.3 g/dL (ref 12.0–15.0)
Immature Granulocytes: 0 %
Lymphocytes Relative: 24 %
Lymphs Abs: 1.9 10*3/uL (ref 0.7–4.0)
MCH: 28.7 pg (ref 26.0–34.0)
MCHC: 32.2 g/dL (ref 30.0–36.0)
MCV: 89 fL (ref 80.0–100.0)
Monocytes Absolute: 0.6 10*3/uL (ref 0.1–1.0)
Monocytes Relative: 8 %
Neutro Abs: 5.1 10*3/uL (ref 1.7–7.7)
Neutrophils Relative %: 64 %
Platelets: 337 10*3/uL (ref 150–400)
RBC: 4.64 MIL/uL (ref 3.87–5.11)
RDW: 13.9 % (ref 11.5–15.5)
WBC: 7.9 10*3/uL (ref 4.0–10.5)
nRBC: 0 % (ref 0.0–0.2)

## 2023-09-20 LAB — BASIC METABOLIC PANEL
Anion gap: 9 (ref 5–15)
BUN: 19 mg/dL (ref 8–23)
CO2: 28 mmol/L (ref 22–32)
Calcium: 8.5 mg/dL — ABNORMAL LOW (ref 8.9–10.3)
Chloride: 102 mmol/L (ref 98–111)
Creatinine, Ser: 0.92 mg/dL (ref 0.44–1.00)
GFR, Estimated: >60 mL/min (ref >60)
Glucose, Bld: 103 mg/dL — ABNORMAL HIGH (ref 70–99)
Potassium: 3.4 mmol/L — ABNORMAL LOW (ref 3.5–5.1)
Sodium: 139 mmol/L (ref 135–145)

## 2023-09-20 LAB — ECHOCARDIOGRAM COMPLETE
AR max vel: 2.34 cm2
AV Peak grad: 7.3 mm[Hg]
Ao pk vel: 1.35 m/s
Area-P 1/2: 5.23 cm2
Height: 62 in
MV M vel: 4.25 m/s
MV Peak grad: 72.1 mm[Hg]
P 1/2 time: 329 ms
S' Lateral: 5.2 cm
Weight: 2640 [oz_av]

## 2023-09-20 LAB — MAGNESIUM: Magnesium: 2.1 mg/dL (ref 1.7–2.4)

## 2023-09-20 MED ORDER — PERFLUTREN LIPID MICROSPHERE
1.0000 mL | INTRAVENOUS | Status: AC | PRN
  Administered 2023-09-20: 4 mL via INTRAVENOUS

## 2023-09-20 MED ORDER — SODIUM CHLORIDE 0.9% FLUSH
3.0000 mL | Freq: Two times a day (BID) | INTRAVENOUS | Status: DC
  Administered 2023-09-21 – 2023-09-22 (×3): 3 mL via INTRAVENOUS

## 2023-09-20 MED ORDER — POTASSIUM CHLORIDE CRYS ER 20 MEQ PO TBCR
40.0000 meq | EXTENDED_RELEASE_TABLET | ORAL | Status: AC
  Administered 2023-09-20 (×2): 40 meq via ORAL
  Filled 2023-09-20 (×2): qty 2

## 2023-09-20 NOTE — Consult Note (Signed)
[facility] Cardiology  CARDIOLOGY CONSULT NOTE  Patient ID: Paige Higgins MRN: [id number] DOB/AGE: [birth year] 76 y.o.  Admit date: 09/19/2023 Referring Physician Huel Cote Primary Physician [facility] Primary Cardiologist Tod Abrahamsen Reason for Consultation shortness of breath  HPI: 77 year old female referred for evaluation of shortness of breath, and possible acute on chronic HFpEF.  The patient has a 1 month history of chest congestion and productive cough, refractory to outpatient biotics and prednisone.  Chest x-ray revealed left lower lobe atelectasis.  Chest CT revealed apparent cardiomegaly with severe left ventricular dilatation with contrast reflux into the IVC and hepatic veins with pulmonary interstitial edema and the patient was instructed to present to [facility] emergency room and was admitted for further evaluation.  Admission labs notable for BNP 611.  CT reveals sinus rhythm at 94 bpm.  Of note, previous 2D echocardiogram 06/04/2021 revealed small left ventricular function, LVEF 50-55%, with mild to moderate aortic insufficiency.  Review of systems complete and found to be negative unless listed above     Past Medical History:  Diagnosis Date   Allergies    Arthritis    Asthma    Depression    Headache(784.0)    Heart murmur    Hyperlipidemia    Hypertension    PONV (postoperative nausea and vomiting)    Recurrent UTI    Sleep apnea    does not wear CPAP   Spondylolisthesis of lumbar region    Wears contact lenses     Past Surgical History:  Procedure Laterality Date   ABDOMINAL HYSTERECTOMY     ARTHROPLASTY  07/07/2012   LEFT SHOULDER   BREAST BIOPSY Left 2000   CARDIAC CATHETERIZATION     COLONOSCOPY     COLONOSCOPY WITH PROPOFOL N/A 02/03/2020   Procedure: COLONOSCOPY WITH PROPOFOL;  Surgeon: Toledo, Boykin Nearing, MD;  Location: [facility] ENDOSCOPY;  Service: Gastroenterology;  Laterality: N/A;   JOINT REPLACEMENT     bilateral knee replacements   LUMBAR  LAMINECTOMY/DECOMPRESSION MICRODISCECTOMY N/A 04/28/2014   Procedure: LUMBAR THREE TO FOUR, LUMBAR FOUR TO FIVE  LAMINECTOMY/DECOMPRESSION MICRODISCECTOMY 2 LEVELS;  Surgeon: Cristi Loron, MD;  Location: [facility] ORS;  Service: Neurosurgery;  Laterality: N/A;  L34 L45 laminectomies   REVERSE SHOULDER ARTHROPLASTY  07/07/2012   Procedure: REVERSE SHOULDER ARTHROPLASTY;  Surgeon: Mable Paris, MD;  Location: [facility] OR;  Service: Orthopedics;  Laterality: Left;   REVERSE SHOULDER ARTHROPLASTY Right 03/10/2018   Procedure: REVERSE SHOULDER ARTHROPLASTY;  Surgeon: Christena Flake, MD;  Location: [facility] ORS;  Service: Orthopedics;  Laterality: Right;   SHOULDER ARTHROSCOPY     right shoulder    Medications Prior to Admission  Medication Sig Dispense Refill Last Dose   albuterol (PROVENTIL) (2.5 MG/3ML) 0.083% nebulizer solution Take 2.5 mg by nebulization 4 (four) times daily.   09/19/2023   albuterol (VENTOLIN HFA) 108 (90 Base) MCG/ACT inhaler Inhale 2 puffs into the lungs every 4 (four) hours as needed. For wheezing. 18 g 12 09/19/2023 at unknown   Ascorbic Acid (VITAMIN C) 1000 MG tablet Take 1,000 mg by mouth daily.   09/18/2023   aspirin EC 81 MG tablet Take 81 mg by mouth daily.   09/19/2023   aspirin-acetaminophen-caffeine (EXCEDRIN MIGRAINE) 250-250-65 MG tablet Take 2 tablets by mouth every 6 (six) hours as needed for headache or migraine.    prn at unknown   atorvastatin (LIPITOR) 40 MG tablet Take 40 mg by mouth daily.   09/19/2023   budesonide-formoterol (SYMBICORT) 160-4.5 MCG/ACT inhaler Inhale 2 puffs  into the lungs in the morning and at bedtime. 1 each 12 prn at unknown   cephALEXin (KEFLEX) 500 MG capsule Take 500 mg by mouth 3 (three) times daily.   09/18/2023   chlorpheniramine (CHLOR-TRIMETON) 4 MG tablet Take 4 mg by mouth 2 (two) times daily as needed for allergies.   prn at unknown   diclofenac sodium (VOLTAREN) 1 % GEL Apply 2 g topically 3 (three) times daily as needed  (joint pain).    prn at unknown   doxycycline (VIBRA-TABS) 100 MG tablet Take 100 mg by mouth 2 (two) times daily.   09/19/2023   fluticasone (FLONASE) 50 MCG/ACT nasal spray Place 2 sprays into both nostrils daily as needed for allergies or rhinitis.   prn at unknown   HYDROcodone-acetaminophen (NORCO/VICODIN) 5-325 MG tablet Take 1 tablet by mouth daily as needed.   09/18/2023   ipratropium (ATROVENT) 0.06 % nasal spray Place 2 sprays into both nostrils 2 (two) times a day.   09/18/2023   montelukast (SINGULAIR) 10 MG tablet Take 1 tablet by mouth at bedtime.   prn at unknown   Multiple Vitamin (MULTIVITAMIN) tablet Take 1 tablet by mouth daily.   09/18/2023   pantoprazole (PROTONIX) 40 MG tablet Take 40 mg by mouth daily.   09/18/2023   predniSONE (DELTASONE) 10 MG tablet Take 10-20 mg by mouth daily with breakfast.   09/18/2023   SUMAtriptan (IMITREX) 50 MG tablet Take 50 mg by mouth every 2 (two) hours as needed for migraine.    prn at unknown   TRELEGY ELLIPTA 200-62.5-25 MCG/ACT AEPB Inhale 1 puff into the lungs daily.   09/19/2023   triamterene-hydrochlorothiazide (MAXZIDE-25) 37.5-25 MG per tablet Take 1 tablet by mouth daily.   09/19/2023   Spacer/Aero-Holding Chambers (AEROCHAMBER MV) inhaler Use as instructed 1 each 0    Social History   Socioeconomic History   Marital status: Widowed    Spouse name: Not on file   Number of children: Not on file   Years of education: Not on file   Highest education level: Not on file  Occupational History   Not on file  Tobacco Use   Smoking status: Former    Current packs/day: 0.00    Average packs/day: 0.5 packs/day for 8.0 years (4.0 ttl pk-yrs)    Types: Cigarettes    Start date: 01/08/1978    Quit date: 01/08/1986    Years since quitting: 37.7   Smokeless tobacco: Never  Vaping Use   Vaping status: Never Used  Substance and Sexual Activity   Alcohol use: No   Drug use: No   Sexual activity: Not Currently    Birth  control/protection: Post-menopausal  Other Topics Concern   Not on file  Social History Narrative   Not on file   Social Determinants of Health   Financial Resource Strain: Not on file  Food Insecurity: Not on file  Transportation Needs: Not on file  Physical Activity: Not on file  Stress: Not on file  Social Connections: Not on file  Intimate Partner Violence: Not on file    Family History  Problem Relation Age of Onset   Breast cancer Neg Hx       Review of systems complete and found to be negative unless listed above      PHYSICAL EXAM  General: Well developed, well nourished, in no acute distress HEENT:  Normocephalic and atramatic Neck:  No JVD.  Lungs: Clear bilaterally to auscultation and percussion. Heart: HRRR . Normal  S1 and S2 without gallops or murmurs.  Abdomen: Bowel sounds are positive, abdomen soft and non-tender  Msk:  Back normal, normal gait. Normal strength and tone for age. Extremities: No clubbing, cyanosis or edema.   Neuro: Alert and oriented X 3. Psych:  Good affect, responds appropriately  Labs:   Lab Results  Component Value Date   WBC 7.9 09/20/2023   HGB 13.3 09/20/2023   HCT 41.3 09/20/2023   MCV 89.0 09/20/2023   PLT 337 09/20/2023    Recent Labs  Lab 09/20/23 0453  NA 139  K 3.4*  CL 102  CO2 28  BUN 19  CREATININE 0.92  CALCIUM 8.5*  GLUCOSE 103*   No results found for: "CKTOTAL", "CKMB", "CKMBINDEX", "TROPONINI"  Lab Results  Component Value Date   CHOL 154 03/06/2023   Lab Results  Component Value Date   HDL 61 03/06/2023   Lab Results  Component Value Date   LDLCALC 73 03/06/2023   Lab Results  Component Value Date   TRIG 98 03/06/2023   Lab Results  Component Value Date   CHOLHDL 2.5 03/06/2023   No results found for: "LDLDIRECT"    Radiology: CT Angio Chest PE W and/or Wo Contrast  Result Date: 09/19/2023 CLINICAL DATA:  Shortness of breath and productive cough. EXAM: CT ANGIOGRAPHY CHEST  WITH CONTRAST TECHNIQUE: Multidetector CT imaging of the chest was performed using the standard protocol during bolus administration of intravenous contrast. Multiplanar CT image reconstructions and MIPs were obtained to evaluate the vascular anatomy. RADIATION DOSE REDUCTION: This exam was performed according to the departmental dose-optimization program which includes automated exposure control, adjustment of the mA and/or kV according to patient size and/or use of iterative reconstruction technique. CONTRAST:  75mL OMNIPAQUE IOHEXOL 350 MG/ML SOLN COMPARISON:  CT a of the chest on 03/06/2023 FINDINGS: Cardiovascular: The pulmonary arteries are adequately opacified. There is no evidence of pulmonary embolism. Central pulmonary arteries are moderately dilated with the main pulmonary artery measuring up to 4.1 cm. The heart is significantly enlarged with severe dilatation of the left ventricle. Reflux of contrast into the IVC and hepatic veins is consistent with some degree of right heart failure. No pericardial fluid identified. Calcified coronary artery plaque present primarily in the distribution of the LAD. The thoracic aorta demonstrates atherosclerosis without aneurysmal disease. Mediastinum/Nodes: No enlarged mediastinal, hilar, or axillary lymph nodes. Thyroid gland, trachea, and esophagus demonstrate no significant findings. Lungs/Pleura: Lungs demonstrate pulmonary interstitial edema and pulmonary venous hypertension. There is a small right pleural effusion and trace left pleural fluid. No focal airspace consolidation, pulmonary mass or pneumothorax. Upper Abdomen: No acute abnormality. Musculoskeletal: Diffuse degenerative disc disease of the spine. No fractures or bony lesions identified. Review of the MIP images confirms the above findings. IMPRESSION: 1. No evidence of pulmonary embolism. 2. Significant cardiomegaly with severe dilatation of the left ventricle. Reflux of contrast into the IVC and  hepatic veins is consistent with some degree of right heart failure. Correlation with echocardiography may be helpful. 3. Pulmonary interstitial edema and pulmonary venous hypertension with small right pleural effusion and trace left pleural fluid. 4. Dilated central pulmonary arteries consistent with pulmonary arterial hypertension. 5. Calcified coronary artery plaque primarily in the distribution of the LAD. 6. Aortic atherosclerosis without aneurysmal disease. Aortic Atherosclerosis (ICD10-I70.0). Electronically Signed   By: Irish Lack M.D.   On: 09/19/2023 14:51   DG Chest 2 View  Result Date: 09/19/2023 CLINICAL DATA:  Chest pain.  Shortness of breath.  Productive cough. EXAM: CHEST - 2 VIEW COMPARISON:  08/19/2023. FINDINGS: Redemonstration of left retrocardiac opacity partially obscuring left hemidiaphragm and blunting the left lateral costophrenic angle, favoring combination of left lung lower lobe atelectasis and/or consolidation with small pleural effusion. No significant interval change since the prior study. Bilateral lung fields are otherwise clear. Right lateral costophrenic angle is clear. Stable moderately enlarged cardio-mediastinal silhouette. Presumed cardiac monitor is seen overlying the left upper anterior chest wall. No acute osseous abnormalities. 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 The soft tissues are within normal limits. IMPRESSION: 1. Stable left retrocardiac opacity, favoring combination of left lung lower lobe atelectasis and/or consolidation with small pleural effusion. 2. Stable cardiomegaly. Electronically Signed   By: Jules Schick M.D.   On: 09/19/2023 10:36    EKG: Sinus rhythm 94 bpm  ASSESSMENT AND PLAN:   1.  Shortness of breath, possible acute HFpEF, BNP only 611, chest CT revealing severe left ventricular dilatation, cardiomegaly, contrast reflux into IVC and hepatic veins concerning for right heart failure, with pulmonary  interstitial edema, 2D echocardiogram pending.  Patient appears euvolemic, on furosemide 40 mg IV daily. 2.  Asthma exacerbation, on doxycycline and prednisone with DuoNebs as needed  Recommendations  1.  Agree with current therapy 2.  Continue diuresis for now 3.  Carefully monitor renal status 4.  Reviewed 2D echocardiogram 5.  Further recommendations pending 2D echocardiogram results  Signed: Marcina Millard MD,PhD, [facility] 09/20/2023, 2:40 PM

## 2023-09-20 NOTE — Plan of Care (Signed)
Admitted from Emergency Department to 2A.  ECHO completed.  Potential plan for cardiac cath Monday with Cardiology, updated patient on expectation that she will have to stay admitted until cardiac cath completed.   Problem: Education: Goal: Knowledge of General Education information will improve Description: Including pain rating scale, medication(s)/side effects and non-pharmacologic comfort measures Outcome: Progressing   Problem: Health Behavior/Discharge Planning: Goal: Ability to manage health-related needs will improve Outcome: Progressing   Problem: Clinical Measurements: Goal: Ability to maintain clinical measurements within normal limits will improve Outcome: Progressing Goal: Will remain free from infection Outcome: Progressing Goal: Diagnostic test results will improve Outcome: Progressing Goal: Respiratory complications will improve Outcome: Progressing Goal: Cardiovascular complication will be avoided Outcome: Progressing   Problem: Activity: Goal: Risk for activity intolerance will decrease Outcome: Progressing   Problem: Nutrition: Goal: Adequate nutrition will be maintained Outcome: Progressing   Problem: Coping: Goal: Level of anxiety will decrease Outcome: Progressing   Problem: Elimination: Goal: Will not experience complications related to bowel motility Outcome: Progressing Goal: Will not experience complications related to urinary retention Outcome: Progressing   Problem: Pain Managment: Goal: General experience of comfort will improve Outcome: Progressing   Problem: Safety: Goal: Ability to remain free from injury will improve Outcome: Progressing   Problem: Skin Integrity: Goal: Risk for impaired skin integrity will decrease Outcome: Progressing

## 2023-09-20 NOTE — Progress Notes (Signed)
Arrived to [facility] 2A room 231.  Oriented to room and call bell.  Vitals obtained.  Placed on cardiac monitor. Box number 2A [id number]   09/20/23 0909  Vitals  Temp 98.2 F (36.8 C)  Temp Source Oral  BP 121/85  MAP (mmHg) 97  BP Location Left Arm  BP Method Automatic  Patient Position (if appropriate) Lying  Pulse Rate 87  Pulse Rate Source Dinamap  ECG Heart Rate 95  Resp 18  Level of Consciousness  Level of Consciousness Alert  MEWS COLOR  MEWS Score Color Green  Oxygen Therapy  SpO2 99 %  O2 Device Room Air  Pain Assessment  Pain Scale 0-10  Pain Score 0  MEWS Score  MEWS Temp 0  MEWS Systolic 0  MEWS Pulse 0  MEWS RR 0  MEWS LOC 0  MEWS Score 0

## 2023-09-20 NOTE — Progress Notes (Signed)
Progress Note   Patient: Paige Higgins OZH:[id number] DOB: Apr 20, 1946 DOA: 09/19/2023     1 DOS: the patient was seen and examined on 09/20/2023   Subjective:  Patient seen and examined at bedside this morning She admits to improvement in shortness of breath Currently requiring intranasal oxygen Denies worsening shortness of breath chest pain cough nausea or vomiting   Brief hospital course: From HPI "[name] is a 77 y.o. female with medical history significant of asthmatic bronchitis with recent acute exacerbation, hypertension, OSA noncompliant with CPAP, chronic sinus tachycardia, hyperlipidemia, who presents to the ED due to shortness of breath.   [name] states that around the beginning of the month, she began to experience a productive cough with chest congestion, rhinorrhea, sinus pressure and headache.  She had a televisit with her pulmonologist who prescribed her prednisone and doxycycline.  Despite this, her symptoms did not improve and she began to experience shortness of breath on exertion.  She denies any lower extremity swelling with the exception of when she has been sitting for a long time.  She notes that she has been having some congestion in her chest when she lays down but this has been going on for several months.  She denies any chest pain or palpitations.  She denies any history of congestive heart disease.   ED course: On arrival to the ED, patient was hypertensive at 152/82 with heart rate of 58.  She was saturating at 100% on room air.  She was afebrile at 98.4.  Initial workup notable for normal CBC, BMP with glucose of 112.  Initial troponin elevated at 38 with flat trend to 40.  COVID-19, RSV and influenza PCR negative.  Chest x-ray was obtained and then CTA which demonstrated significant cardiomegaly with severe dilation of the left ventricle with contrast reflux into the IVC and hepatic veins concerning for right heart failure in addition to  pulmonary interstitial edema with small right pleural effusion.  Patient received DuoNebs and IV Lasix.  TRH contacted for admission.  "  Assessment and Plan:  Acute systolic congestive heart failure (HCC) Patient presents with nearly 2 weeks of dyspnea with exertion as well as orthopnea last echocardiogram in 2022 with no evidence of heart failure.  Uncertain etiology, however differential includes nonischemic cardiomyopathy including possible viral etiology.  LHC in 2011 with normal coronaries and Myoview in 2017 with no ischemia Echocardiogram performed on 09/19/2023 showing EF 30 to 35% Cardiologist on board and case discussed Continue telemetry monitoring Continue input and output management Continue Daily weighing Follow-up on echocardiogram We will introduce other GDMT medications as blood pressure allows    Acute asthma Patient had a televisit on 10/03 with her pulmonology office reporting chest congestion cough wheezing and rhinorrhea at which time she was prescribed prednisone and doxycycline. Plan Continue doxycycline Continue prednisone course Continue duo nebulization Continue bronchodilator therapy    Headache Patient is on sumatriptan at home, however will need to avoid in the setting of acute heart failure. Continue Fioricet as needed   OSA (obstructive sleep apnea) Per chart review, patient is noncompliant with her home CPAP.   Essential hypertension Continue current antihypertensives   Advance Care Planning:   Code Status: Full Code    Consults: Cardiology   Family Communication: Patient's husband updated at bedside    Physical Exam:  Constitutional:      General: She is not in acute distress.    Appearance: She is obese. She is not toxic-appearing.  HENT:     Head: Normocephalic and atraumatic.  Eyes:     Extraocular Movements: Extraocular movements intact.     Pupils: Pupils are equal, round, and reactive to light.  Neck:     Vascular: JVD  present.  Cardiovascular:     Rate and Rhythm: Normal rate and regular rhythm.  Pulmonary: Bilateral basal crackles heard Abdominal:     Palpations: Abdomen is soft.     Tenderness: There is no abdominal tenderness.  Musculoskeletal:     Right lower leg: No edema.     Left lower leg: No edema.  Skin:    General: Skin is warm and dry.  Neurological:     General: No focal deficit present.     Mental Status: She is alert and oriented to person, place, and time.     Motor: No weakness.  Psychiatric:        Mood and Affect: Mood normal.        Behavior: Behavior normal.   Data Reviewed: I have personally reviewed patient's chest x-ray reported that showed findings of left lower lobe atelectasis, cardiomegaly.  I have reviewed patient's CT scan of the chest that did not show any acute PE however did show some findings of pulmonary vascular congestion I have reviewed patient's vitals as mentioned below as well as patient's labs, I have reviewed cardiology documentation as well as nursing documentation   Disposition: Status is: Inpatient   Time spent: 58 minutes spent taking care of patient as well as reviewing above data and discussing plan of care with cardiologist, more than 50% of this time was spent at bedside     Latest Ref Rng & Units 09/20/2023    4:53 AM 09/19/2023    9:08 AM 08/20/2023   12:49 PM  CBC  WBC 4.0 - 10.5 K/uL 7.9  8.4  8.4   Hemoglobin 12.0 - 15.0 g/dL 96.0  45.4  09.8   Hematocrit 36.0 - 46.0 % 41.3  42.7  43.0   Platelets 150 - 400 K/uL 337  359  327        Latest Ref Rng & Units 09/20/2023    4:53 AM 09/19/2023    9:08 AM 03/06/2023   12:37 PM  BMP  Glucose 70 - 99 mg/dL 119  147  829   BUN 8 - 23 mg/dL 19  21  23    Creatinine 0.44 - 1.00 mg/dL 5.62  1.30  8.65   Sodium 135 - 145 mmol/L 139  138  136   Potassium 3.5 - 5.1 mmol/L 3.4  3.5  3.0   Chloride 98 - 111 mmol/L 102  102  98   CO2 22 - 32 mmol/L 28  25  26    Calcium 8.9 - 10.3 mg/dL 8.5   8.6  8.8     Vitals:   09/20/23 0340 09/20/23 0757 09/20/23 0909 09/20/23 1221  BP: 132/76 132/70 121/85 135/73  Pulse: 93 99 87 88  Resp: 18 17 18 16   Temp: 98.1 F (36.7 C) 98.5 F (36.9 C) 98.2 F (36.8 C) 98.3 F (36.8 C)  TempSrc: Oral Oral Oral Oral  SpO2: 93% 94% 99% (!) 9%  Weight:      Height:         Author: Loyce Dys, MD 09/20/2023 3:32 PM  For on call review [email or web address].

## 2023-09-20 NOTE — Progress Notes (Signed)
Echocardiogram 2D Echocardiogram has been performed.  Paige Higgins 09/20/2023, 12:25 PM

## 2023-09-21 DIAGNOSIS — I509 Heart failure, unspecified: Secondary | ICD-10-CM

## 2023-09-21 LAB — CBC WITH DIFFERENTIAL/PLATELET
Abs Immature Granulocytes: 0.04 10*3/uL (ref 0.00–0.07)
Basophils Absolute: 0 10*3/uL (ref 0.0–0.1)
Basophils Relative: 0 %
Eosinophils Absolute: 0 10*3/uL (ref 0.0–0.5)
Eosinophils Relative: 0 %
HCT: 43.4 % (ref 36.0–46.0)
Hemoglobin: 13.7 g/dL (ref 12.0–15.0)
Immature Granulocytes: 0 %
Lymphocytes Relative: 20 %
Lymphs Abs: 1.8 10*3/uL (ref 0.7–4.0)
MCH: 28.4 pg (ref 26.0–34.0)
MCHC: 31.6 g/dL (ref 30.0–36.0)
MCV: 89.9 fL (ref 80.0–100.0)
Monocytes Absolute: 0.7 10*3/uL (ref 0.1–1.0)
Monocytes Relative: 7 %
Neutro Abs: 6.7 10*3/uL (ref 1.7–7.7)
Neutrophils Relative %: 73 %
Platelets: 363 10*3/uL (ref 150–400)
RBC: 4.83 MIL/uL (ref 3.87–5.11)
RDW: 13.8 % (ref 11.5–15.5)
WBC: 9.3 10*3/uL (ref 4.0–10.5)
nRBC: 0 % (ref 0.0–0.2)

## 2023-09-21 LAB — BASIC METABOLIC PANEL
Anion gap: 11 (ref 5–15)
BUN: 25 mg/dL — ABNORMAL HIGH (ref 8–23)
CO2: 26 mmol/L (ref 22–32)
Calcium: 8.8 mg/dL — ABNORMAL LOW (ref 8.9–10.3)
Chloride: 102 mmol/L (ref 98–111)
Creatinine, Ser: 1.02 mg/dL — ABNORMAL HIGH (ref 0.44–1.00)
GFR, Estimated: 56 mL/min — ABNORMAL LOW (ref >60)
Glucose, Bld: 115 mg/dL — ABNORMAL HIGH (ref 70–99)
Potassium: 3.8 mmol/L (ref 3.5–5.1)
Sodium: 139 mmol/L (ref 135–145)

## 2023-09-21 MED ORDER — SALINE SPRAY 0.65 % NA SOLN
1.0000 | NASAL | Status: DC | PRN
  Administered 2023-09-22: 1 via NASAL
  Filled 2023-09-21: qty 44

## 2023-09-21 MED ORDER — SODIUM CHLORIDE 0.9 % WEIGHT BASED INFUSION
3.0000 mL/kg/h | INTRAVENOUS | Status: AC
  Administered 2023-09-21: 3 mL/kg/h via INTRAVENOUS

## 2023-09-21 MED ORDER — SODIUM CHLORIDE 0.9 % IV SOLN: 250.0000 mL | INTRAVENOUS | Status: DC | PRN

## 2023-09-21 MED ORDER — SODIUM CHLORIDE 0.9 % WEIGHT BASED INFUSION
1.0000 mL/kg/h | INTRAVENOUS | Status: DC
  Administered 2023-09-21 – 2023-09-22 (×2): 1 mL/kg/h via INTRAVENOUS

## 2023-09-21 MED ORDER — SODIUM CHLORIDE 0.9% FLUSH: 3.0000 mL | INTRAVENOUS | Status: DC | PRN

## 2023-09-21 MED ORDER — ASPIRIN 81 MG PO CHEW
81.0000 mg | CHEWABLE_TABLET | ORAL | Status: AC
  Administered 2023-09-22: 81 mg via ORAL
  Filled 2023-09-21: qty 1

## 2023-09-21 MED ORDER — LORATADINE 10 MG PO TABS
10.0000 mg | ORAL_TABLET | Freq: Once | ORAL | Status: AC
  Administered 2023-09-21: 10 mg via ORAL
  Filled 2023-09-21: qty 1

## 2023-09-21 MED ORDER — CARVEDILOL 3.125 MG PO TABS
3.1250 mg | ORAL_TABLET | Freq: Two times a day (BID) | ORAL | Status: DC
  Administered 2023-09-21 – 2023-09-22 (×3): 3.125 mg via ORAL
  Filled 2023-09-21 (×3): qty 1

## 2023-09-21 NOTE — Progress Notes (Signed)
[facility]  SUBJECTIVE: Patient sitting on side of the bed, reports doing well, denies chest pain or shortness of breath   Vitals:   09/20/23 2000 09/20/23 2340 09/21/23 0400 09/21/23 0748  BP: 112/82 117/67 117/65 116/89  Pulse: 80 91 90 92  Resp:  18  18  Temp: 98.7 F (37.1 C) 97.9 F (36.6 C) 98 F (36.7 C) 98.5 F (36.9 C)  TempSrc: Oral Oral Oral Oral  SpO2: 95% 95% 94% 99%  Weight:      Height:         Intake/Output Summary (Last 24 hours) at 09/21/2023 9604 Last data filed at 09/20/2023 1412 Gross per 24 hour  Intake 120 ml  Output --  Net 120 ml      PHYSICAL EXAM  General: Well developed, well nourished, in no acute distress HEENT:  Normocephalic and atramatic Neck:  No JVD.  Lungs: Clear bilaterally to auscultation and percussion. Heart: HRRR . Normal S1 and S2 without gallops or murmurs.  Abdomen: Bowel sounds are positive, abdomen soft and non-tender  Msk:  Back normal, normal gait. Normal strength and tone for age. Extremities: No clubbing, cyanosis or edema.   Neuro: Alert and oriented X 3. Psych:  Good affect, responds appropriately   LABS: Basic Metabolic Panel: Recent Labs    09/20/23 0453 09/21/23 0240  NA 139 139  K 3.4* 3.8  CL 102 102  CO2 28 26  GLUCOSE 103* 115*  BUN 19 25*  CREATININE 0.92 1.02*  CALCIUM 8.5* 8.8*  MG 2.1  --    Liver Function Tests: No results for input(s): "AST", "ALT", "ALKPHOS", "BILITOT", "PROT", "ALBUMIN" in the last 72 hours. No results for input(s): "LIPASE", "AMYLASE" in the last 72 hours. CBC: Recent Labs    09/20/23 0453 09/21/23 0240  WBC 7.9 9.3  NEUTROABS 5.1 6.7  HGB 13.3 13.7  HCT 41.3 43.4  MCV 89.0 89.9  PLT 337 363   Cardiac Enzymes: No results for input(s): "CKTOTAL", "CKMB", "CKMBINDEX", "TROPONINI" in the last 72 hours. BNP: Invalid input(s): "POCBNP" D-Dimer: Recent Labs    09/18/23 1257  DDIMER 0.51*   Hemoglobin A1C: No results for input(s): "HGBA1C" in the  last 72 hours. Fasting Lipid Panel: No results for input(s): "CHOL", "HDL", "LDLCALC", "TRIG", "CHOLHDL", "LDLDIRECT" in the last 72 hours. Thyroid Function Tests: Recent Labs    09/19/23 1226  TSH 2.106   Anemia Panel: No results for input(s): "VITAMINB12", "FOLATE", "FERRITIN", "TIBC", "IRON", "RETICCTPCT" in the last 72 hours.  ECHOCARDIOGRAM COMPLETE  Result Date: 09/20/2023    ECHOCARDIOGRAM REPORT   Patient Name:   Paige Higgins Date of Exam: 09/20/2023 Medical Rec #:  [id number]           Height:       62.0 in Accession #:    [id number]          Weight:       165.0 lb Date of Birth:  09-14-1946           BSA:          1.762 m Patient Age:    77 years            BP:           121/85 mmHg Patient Gender: F                   HR:           93 bpm. Exam Location:  [facility] Procedure:  2D Echo, Cardiac Doppler, Color Doppler and Intracardiac            Opacification Agent Indications:     CHF-Acute Systolic I50.21  History:         Patient has no prior history of Echocardiogram examinations.                  Risk Factors:Hypertension, Dyslipidemia and Sleep Apnea.  Sonographer:     Lucendia Herrlich RCS Referring Phys:  [id number] Verdene Lennert Diagnosing Phys: Marcina Millard MD IMPRESSIONS  1. Left ventricular ejection fraction, by estimation, is 30 to 35%. The left ventricle has moderately decreased function. The left ventricle has no regional wall motion abnormalities. The left ventricular internal cavity size was moderately dilated. Left ventricular diastolic parameters were normal.  2. Right ventricular systolic function is normal. The right ventricular size is normal.  3. The mitral valve is normal in structure. Moderate mitral valve regurgitation. No evidence of mitral stenosis.  4. The aortic valve is normal in structure. Aortic valve regurgitation is mild. No aortic stenosis is present.  5. The inferior vena cava is normal in size with greater than 50% respiratory variability, suggesting  right atrial pressure of 3 mmHg. FINDINGS  Left Ventricle: Left ventricular ejection fraction, by estimation, is 30 to 35%. The left ventricle has moderately decreased function. The left ventricle has no regional wall motion abnormalities. Definity contrast agent was given IV to delineate the left ventricular endocardial borders. The left ventricular internal cavity size was moderately dilated. There is no left ventricular hypertrophy. Left ventricular diastolic parameters were normal. Right Ventricle: The right ventricular size is normal. No increase in right ventricular wall thickness. Right ventricular systolic function is normal. Left Atrium: Left atrial size was normal in size. Right Atrium: Right atrial size was normal in size. Pericardium: There is no evidence of pericardial effusion. Mitral Valve: The mitral valve is normal in structure. Moderate mitral valve regurgitation. No evidence of mitral valve stenosis. Tricuspid Valve: The tricuspid valve is normal in structure. Tricuspid valve regurgitation is not demonstrated. No evidence of tricuspid stenosis. Aortic Valve: The aortic valve is normal in structure. Aortic valve regurgitation is mild. Aortic regurgitation PHT measures 329 msec. No aortic stenosis is present. Aortic valve peak gradient measures 7.3 mmHg. Pulmonic Valve: The pulmonic valve was normal in structure. Pulmonic valve regurgitation is not visualized. No evidence of pulmonic stenosis. Aorta: The aortic root is normal in size and structure. Venous: The inferior vena cava is normal in size with greater than 50% respiratory variability, suggesting right atrial pressure of 3 mmHg. IAS/Shunts: No atrial level shunt detected by color flow Doppler.  LEFT VENTRICLE PLAX 2D LVIDd:         6.10 cm   Diastology LVIDs:         5.20 cm   LV e' medial:    5.07 cm/s LV PW:         0.90 cm   LV E/e' medial:  18.9 LV IVS:        0.50 cm   LV e' lateral:   8.36 cm/s LVOT diam:     2.20 cm   LV E/e' lateral:  11.4 LV SV:         50 LV SV Index:   28 LVOT Area:     3.80 cm  RIGHT VENTRICLE             IVC RV S prime:     12.70 cm/s  IVC diam:  1.10 cm TAPSE (M-mode): 2.2 cm LEFT ATRIUM             Index        RIGHT ATRIUM           Index LA diam:        4.60 cm 2.61 cm/m   RA Area:     16.60 cm LA Vol (A2C):   61.6 ml 34.97 ml/m  RA Volume:   42.60 ml  24.18 ml/m LA Vol (A4C):   61.4 ml 34.85 ml/m LA Biplane Vol: 55.3 ml 31.39 ml/m  AORTIC VALVE AV Area (Vmax): 2.34 cm AV Vmax:        135.00 cm/s AV Peak Grad:   7.3 mmHg LVOT Vmax:      83.03 cm/s LVOT Vmean:     54.500 cm/s LVOT VTI:       0.131 m AI PHT:         329 msec  AORTA Ao Root diam: 3.40 cm Ao Asc diam:  3.40 cm MITRAL VALVE               TRICUSPID VALVE MV Area (PHT): 5.23 cm    TR Peak grad:   25.6 mmHg MV Decel Time: 145 msec    TR Vmax:        253.00 cm/s MR Peak grad: 72.1 mmHg MR Vmax:      424.67 cm/s  SHUNTS MV E velocity: 95.70 cm/s  Systemic VTI:  0.13 m MV A velocity: 62.60 cm/s  Systemic Diam: 2.20 cm MV E/A ratio:  1.53 Marcina Millard MD Electronically signed by Marcina Millard MD Signature Date/Time: 09/20/2023/3:07:09 PM    Final    CT Angio Chest PE W and/or Wo Contrast  Result Date: 09/19/2023 CLINICAL DATA:  Shortness of breath and productive cough. EXAM: CT ANGIOGRAPHY CHEST WITH CONTRAST TECHNIQUE: Multidetector CT imaging of the chest was performed using the standard protocol during bolus administration of intravenous contrast. Multiplanar CT image reconstructions and MIPs were obtained to evaluate the vascular anatomy. RADIATION DOSE REDUCTION: This exam was performed according to the departmental dose-optimization program which includes automated exposure control, adjustment of the mA and/or kV according to patient size and/or use of iterative reconstruction technique. CONTRAST:  75mL OMNIPAQUE IOHEXOL 350 MG/ML SOLN COMPARISON:  CT a of the chest on 03/06/2023 FINDINGS: Cardiovascular: The pulmonary arteries are  adequately opacified. There is no evidence of pulmonary embolism. Central pulmonary arteries are moderately dilated with the main pulmonary artery measuring up to 4.1 cm. The heart is significantly enlarged with severe dilatation of the left ventricle. Reflux of contrast into the IVC and hepatic veins is consistent with some degree of right heart failure. No pericardial fluid identified. Calcified coronary artery plaque present primarily in the distribution of the LAD. The thoracic aorta demonstrates atherosclerosis without aneurysmal disease. Mediastinum/Nodes: No enlarged mediastinal, hilar, or axillary lymph nodes. Thyroid gland, trachea, and esophagus demonstrate no significant findings. Lungs/Pleura: Lungs demonstrate pulmonary interstitial edema and pulmonary venous hypertension. There is a small right pleural effusion and trace left pleural fluid. No focal airspace consolidation, pulmonary mass or pneumothorax. Upper Abdomen: No acute abnormality. Musculoskeletal: Diffuse degenerative disc disease of the spine. No fractures or bony lesions identified. Review of the MIP images confirms the above findings. IMPRESSION: 1. No evidence of pulmonary embolism. 2. Significant cardiomegaly with severe dilatation of the left ventricle. Reflux of contrast into the IVC and hepatic veins is consistent with some degree of right heart failure. Correlation with  echocardiography may be helpful. 3. Pulmonary interstitial edema and pulmonary venous hypertension with small right pleural effusion and trace left pleural fluid. 4. Dilated central pulmonary arteries consistent with pulmonary arterial hypertension. 5. Calcified coronary artery plaque primarily in the distribution of the LAD. 6. Aortic atherosclerosis without aneurysmal disease. Aortic Atherosclerosis (ICD10-I70.0). Electronically Signed   By: Irish Lack M.D.   On: 09/19/2023 14:51   DG Chest 2 View  Result Date: 09/19/2023 CLINICAL DATA:  Chest pain.   Shortness of breath.  Productive cough. EXAM: CHEST - 2 VIEW COMPARISON:  08/19/2023. FINDINGS: Redemonstration of left retrocardiac opacity partially obscuring left hemidiaphragm and blunting the left lateral costophrenic angle, favoring combination of left lung lower lobe atelectasis and/or consolidation with small pleural effusion. No significant interval change since the prior study. Bilateral lung fields are otherwise clear. Right lateral costophrenic angle is clear. Stable moderately enlarged cardio-mediastinal silhouette. Presumed cardiac monitor is seen overlying the left upper anterior chest wall. No acute osseous abnormalities. 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 The soft tissues are within normal limits. IMPRESSION: 1. Stable left retrocardiac opacity, favoring combination of left lung lower lobe atelectasis and/or consolidation with small pleural effusion. 2. Stable cardiomegaly. Electronically Signed   By: Jules Schick M.D.   On: 09/19/2023 10:36     Echo LVEF 30-35%, moderate mitral regurgitation  TELEMETRY: Sinus rhythm 94 bpm:  ASSESSMENT AND PLAN:  Principal Problem:   Acute heart failure (HCC) Active Problems:   Asthma exacerbation   Essential hypertension   OSA (obstructive sleep apnea)   Headache    1. Acute HFrEF, BNP only 611, chest CT revealing severe left ventricular dilatation, cardiomegaly, contrast reflux into IVC and hepatic veins concerning for right heart failure, with pulmonary interstitial edema, 2D echocardiogram 09/20/2023 revealed LVEF 30-35% with moderate mitral regurgitation..  Patient appears euvolemic, on furosemide 40 mg IV daily. 2.  Asthma exacerbation, on doxycycline and prednisone with DuoNebs as needed   Recommendations   1.  Agree with current therapy 2.  Continue diuresis for now 3.  Carefully monitor renal status 4.  Add carvedilol 3.125 mg twice daily 5.  Right and left heart cardiac catheterization 09/22/2023.   The risk, benefits alternatives of cardiac catheterization were explained to the patient and informed consent was obtained.   Marcina Millard, MD, PhD, [facility] 09/21/2023 9:26 AM

## 2023-09-21 NOTE — Progress Notes (Signed)
Progress Note   Patient: Paige Higgins NWG:[id number] DOB: January 30, 1946 DOA: 09/19/2023     2 DOS: the patient was seen and examined on 09/21/2023   Subjective:  Patient seen and examined at bedside this morning Admits to improvement in shortness of breath Cardiologist planning cardiac catheterization hopefully tomorrow   Brief hospital course: From HPI "Paige Higgins is a 77 y.o. female with medical history significant of asthmatic bronchitis with recent acute exacerbation, hypertension, OSA noncompliant with CPAP, chronic sinus tachycardia, hyperlipidemia, who presents to the ED due to shortness of breath.   Paige Higgins that around the beginning of the month, Paige began to experience a productive cough with chest congestion, rhinorrhea, sinus pressure and headache.  Paige had a televisit with her pulmonologist who prescribed her prednisone and doxycycline.  Despite this, her symptoms did not improve and Paige began to experience shortness of breath on exertion.  Paige Higgins any lower extremity swelling with the exception of when Paige has been sitting for a long time.  Paige notes that Paige has been having some congestion in her chest when Paige lays down but this has been going on for several months.  Paige Higgins any chest pain or palpitations.  Paige Higgins any history of congestive heart disease.   ED course: On arrival to the ED, patient was hypertensive at 152/82 with heart rate of 58.  Paige was saturating at 100% on room air.  Paige was afebrile at 98.4.  Initial workup notable for normal CBC, BMP with glucose of 112.  Initial troponin elevated at 38 with flat trend to 40.  COVID-19, RSV and influenza PCR negative.  Chest x-ray was obtained and then CTA which demonstrated significant cardiomegaly with severe dilation of the left ventricle with contrast reflux into the IVC and hepatic veins concerning for right heart failure in addition to pulmonary interstitial edema with small right pleural  effusion.  Patient received DuoNebs and IV Lasix.  [facility] contacted for admission.  "   Assessment and Plan:  Acute systolic congestive heart failure (HCC) Patient presents with nearly 2 weeks of dyspnea with exertion as well as orthopnea last echocardiogram in 2022 with no evidence of heart failure.  Uncertain etiology, however differential includes nonischemic cardiomyopathy including possible viral etiology.  LHC in 2011 with normal coronaries and Myoview in 2017 with no ischemia Echocardiogram performed on 09/19/2023 showing EF 30 to 35% Plan of care discussed with cardiologist Plan for cardiac cath tomorrow Continue telemetry monitoring Continue monitoring input and output Continue Daily weighing We will introduce other GDMT medications as blood pressure allows      Acute asthma Patient had a televisit on 10/03 with her pulmonology office reporting chest congestion cough wheezing and rhinorrhea at which time Paige was prescribed prednisone and doxycycline. Plan Continue doxycycline Continue prednisone course Continue duo nebulization Continue bronchodilator therapy     Headache Patient is on sumatriptan at home, however will need to avoid in the setting of acute heart failure. Continue Fioricet as needed   OSA (obstructive sleep apnea) Per chart review, patient is noncompliant with her home CPAP.   Essential hypertension Continue current antihypertensives   Advance Care Planning:   Code Status: Full Code    Consults: Cardiology   Family Communication: Patient's husband updated at bedside       Physical Exam:   Constitutional:      General: Paige is not in acute distress.    Appearance: Paige is obese. Paige is not toxic-appearing.  HENT:     Head: Normocephalic and atraumatic.  Eyes:     Extraocular Movements: Extraocular movements intact.     Pupils: Pupils are equal, round, and reactive to light.  Neck:     Vascular: JVD present.  Cardiovascular:     Rate and Rhythm:  Normal rate and regular rhythm.  Pulmonary: Bilateral basal crackles heard Abdominal:     Palpations: Abdomen is soft.     Tenderness: There is no abdominal tenderness.  Musculoskeletal:     Right lower leg: No edema.     Left lower leg: No edema.  Skin:    General: Skin is warm and dry.  Neurological:     General: No focal deficit present.     Mental Status: Paige is alert and oriented to person, place, and time.     Motor: No weakness.  Psychiatric:        Mood and Affect: Mood normal.        Behavior: Behavior normal.    Data Reviewed: I have reviewed patient's labs below as well as vitals, cardiology documentation and nursing documentation   Disposition: Status is: Inpatient     Time spent: 50 minutes       Latest Ref Rng & Units 09/21/2023    2:40 AM 09/20/2023    4:53 AM 09/19/2023    9:08 AM  CBC  WBC 4.0 - 10.5 K/uL 9.3  7.9  8.4   Hemoglobin 12.0 - 15.0 g/dL 84.6  96.2  95.2   Hematocrit 36.0 - 46.0 % 43.4  41.3  42.7   Platelets 150 - 400 K/uL 363  337  359        Latest Ref Rng & Units 09/21/2023    2:40 AM 09/20/2023    4:53 AM 09/19/2023    9:08 AM  BMP  Glucose 70 - 99 mg/dL 841  324  401   BUN 8 - 23 mg/dL 25  19  21    Creatinine 0.44 - 1.00 mg/dL 0.27  2.53  6.64   Sodium 135 - 145 mmol/L 139  139  138   Potassium 3.5 - 5.1 mmol/L 3.8  3.4  3.5   Chloride 98 - 111 mmol/L 102  102  102   CO2 22 - 32 mmol/L 26  28  25    Calcium 8.9 - 10.3 mg/dL 8.8  8.5  8.6     Vitals:   09/21/23 0748 09/21/23 1212 09/21/23 1340 09/21/23 1519  BP: 116/89 114/78  124/65  Pulse: 92 95  (!) 103  Resp: 18 20  20   Temp: 98.5 F (36.9 C) 98.4 F (36.9 C)  99.1 F (37.3 C)  TempSrc: Oral   Oral  SpO2: 99% 96% 97% 97%  Weight:      Height:         Author: Loyce Dys, MD 09/21/2023 5:41 PM  For on call review [email or web address].

## 2023-09-22 ENCOUNTER — Encounter
Admission: EM | Disposition: A | Discharge status: Home / Self Care | Payer: Self-pay | Source: Home / Self Care | Attending: Internal Medicine

## 2023-09-22 DIAGNOSIS — I5021 Acute systolic (congestive) heart failure: Secondary | ICD-10-CM | POA: Diagnosis not present

## 2023-09-22 HISTORY — PX: RIGHT/LEFT HEART CATH AND CORONARY ANGIOGRAPHY: CATH118266

## 2023-09-22 LAB — CBC WITH DIFFERENTIAL/PLATELET
Abs Immature Granulocytes: 0.06 10*3/uL (ref 0.00–0.07)
Basophils Absolute: 0.1 10*3/uL (ref 0.0–0.1)
Basophils Relative: 1 %
Eosinophils Absolute: 0.1 10*3/uL (ref 0.0–0.5)
Eosinophils Relative: 1 %
HCT: 41.5 % (ref 36.0–46.0)
Hemoglobin: 13.4 g/dL (ref 12.0–15.0)
Immature Granulocytes: 1 %
Lymphocytes Relative: 19 %
Lymphs Abs: 2 10*3/uL (ref 0.7–4.0)
MCH: 28.6 pg (ref 26.0–34.0)
MCHC: 32.3 g/dL (ref 30.0–36.0)
MCV: 88.5 fL (ref 80.0–100.0)
Monocytes Absolute: 0.7 10*3/uL (ref 0.1–1.0)
Monocytes Relative: 7 %
Neutro Abs: 7.4 10*3/uL (ref 1.7–7.7)
Neutrophils Relative %: 71 %
Platelets: 337 10*3/uL (ref 150–400)
RBC: 4.69 MIL/uL (ref 3.87–5.11)
RDW: 14 % (ref 11.5–15.5)
WBC: 10.3 10*3/uL (ref 4.0–10.5)
nRBC: 0 % (ref 0.0–0.2)

## 2023-09-22 LAB — POCT I-STAT EG7
Acid-Base Excess: 1 mmol/L (ref 0.0–2.0)
Bicarbonate: 27.4 mmol/L (ref 20.0–28.0)
Calcium, Ion: 1.16 mmol/L (ref 1.15–1.40)
HCT: 43 % (ref 36.0–46.0)
Hemoglobin: 14.6 g/dL (ref 12.0–15.0)
O2 Saturation: 68 %
Potassium: 3.4 mmol/L — ABNORMAL LOW (ref 3.5–5.1)
Sodium: 140 mmol/L (ref 135–145)
TCO2: 29 mmol/L (ref 22–32)
pCO2, Ven: 47 mm[Hg] (ref 44–60)
pH, Ven: 7.373 (ref 7.25–7.43)
pO2, Ven: 37 mm[Hg] (ref 32–45)

## 2023-09-22 LAB — BASIC METABOLIC PANEL
Anion gap: 11 (ref 5–15)
BUN: 31 mg/dL — ABNORMAL HIGH (ref 8–23)
CO2: 26 mmol/L (ref 22–32)
Calcium: 8.6 mg/dL — ABNORMAL LOW (ref 8.9–10.3)
Chloride: 103 mmol/L (ref 98–111)
Creatinine, Ser: 1 mg/dL (ref 0.44–1.00)
GFR, Estimated: 58 mL/min — ABNORMAL LOW (ref >60)
Glucose, Bld: 101 mg/dL — ABNORMAL HIGH (ref 70–99)
Potassium: 3.6 mmol/L (ref 3.5–5.1)
Sodium: 140 mmol/L (ref 135–145)

## 2023-09-22 SURGERY — RIGHT/LEFT HEART CATH AND CORONARY ANGIOGRAPHY
Anesthesia: Moderate Sedation

## 2023-09-22 MED ORDER — SODIUM CHLORIDE 0.9 % WEIGHT BASED INFUSION: 1.0000 mL/kg/h | INTRAVENOUS | Status: DC

## 2023-09-22 MED ORDER — HYDRALAZINE HCL 20 MG/ML IJ SOLN: 10.0000 mg | INTRAMUSCULAR | Status: DC | PRN

## 2023-09-22 MED ORDER — FENTANYL CITRATE (PF) 100 MCG/2ML IJ SOLN
INTRAMUSCULAR | Status: AC
  Filled 2023-09-22: qty 2

## 2023-09-22 MED ORDER — VERAPAMIL HCL 2.5 MG/ML IV SOLN
INTRAVENOUS | Status: DC | PRN
  Administered 2023-09-22: 2.5 mg via INTRA_ARTERIAL

## 2023-09-22 MED ORDER — LISINOPRIL 5 MG PO TABS
2.5000 mg | ORAL_TABLET | Freq: Every day | ORAL | Status: DC
  Administered 2023-09-22: 2.5 mg via ORAL
  Filled 2023-09-22: qty 1

## 2023-09-22 MED ORDER — MIDAZOLAM HCL 2 MG/2ML IJ SOLN
INTRAMUSCULAR | Status: DC | PRN
  Administered 2023-09-22 (×2): 1 mg via INTRAVENOUS

## 2023-09-22 MED ORDER — HEPARIN SODIUM (PORCINE) 1000 UNIT/ML IJ SOLN
INTRAMUSCULAR | Status: AC
  Filled 2023-09-22: qty 10

## 2023-09-22 MED ORDER — ASPIRIN 81 MG PO CHEW
CHEWABLE_TABLET | ORAL | Status: AC
  Filled 2023-09-22: qty 1

## 2023-09-22 MED ORDER — CARVEDILOL 3.125 MG PO TABS: 3.1250 mg | ORAL_TABLET | Freq: Two times a day (BID) | ORAL | 1 refills | Status: DC

## 2023-09-22 MED ORDER — LIDOCAINE HCL (PF) 1 % IJ SOLN
INTRAMUSCULAR | Status: DC | PRN
  Administered 2023-09-22: 2 mL

## 2023-09-22 MED ORDER — FENTANYL CITRATE (PF) 100 MCG/2ML IJ SOLN
INTRAMUSCULAR | Status: DC | PRN
  Administered 2023-09-22: 25 ug via INTRAVENOUS

## 2023-09-22 MED ORDER — MIDAZOLAM HCL 2 MG/2ML IJ SOLN
INTRAMUSCULAR | Status: AC
  Filled 2023-09-22: qty 2

## 2023-09-22 MED ORDER — HEPARIN (PORCINE) IN NACL 1000-0.9 UT/500ML-% IV SOLN
INTRAVENOUS | Status: AC
  Filled 2023-09-22: qty 1000

## 2023-09-22 MED ORDER — VERAPAMIL HCL 2.5 MG/ML IV SOLN
INTRAVENOUS | Status: AC
  Filled 2023-09-22: qty 2

## 2023-09-22 MED ORDER — ASPIRIN 81 MG PO TBEC
DELAYED_RELEASE_TABLET | ORAL | Status: DC | PRN
  Administered 2023-09-22: 81 mg via ORAL

## 2023-09-22 MED ORDER — LABETALOL HCL 5 MG/ML IV SOLN: 10.0000 mg | INTRAVENOUS | Status: DC | PRN

## 2023-09-22 MED ORDER — HEPARIN SODIUM (PORCINE) 1000 UNIT/ML IJ SOLN
INTRAMUSCULAR | Status: DC | PRN
  Administered 2023-09-22: 3500 [IU] via INTRAVENOUS

## 2023-09-22 MED ORDER — LISINOPRIL 2.5 MG PO TABS: 2.5000 mg | ORAL_TABLET | Freq: Every day | ORAL | 1 refills | Status: DC

## 2023-09-22 MED ORDER — IOHEXOL 300 MG/ML  SOLN
INTRAMUSCULAR | Status: DC | PRN
  Administered 2023-09-22: 83 mL

## 2023-09-22 MED ORDER — HEPARIN (PORCINE) IN NACL 2000-0.9 UNIT/L-% IV SOLN
INTRAVENOUS | Status: DC | PRN
  Administered 2023-09-22: 1000 mL

## 2023-09-22 SURGICAL SUPPLY — 16 items
CATH 5FR JL3.5 JR4 ANG PIG MP (CATHETERS) IMPLANT
CATH SWAN GANZ 7F STRAIGHT (CATHETERS) IMPLANT
DEVICE RAD TR BAND REGULAR (VASCULAR PRODUCTS) IMPLANT
DRAPE BRACHIAL (DRAPES) IMPLANT
GLIDESHEATH SLEND SS 6F .021 (SHEATH) IMPLANT
GUIDEWIRE INQWIRE 1.5J.035X260 (WIRE) IMPLANT
INQWIRE 1.5J .035X260CM (WIRE) ×1
NDL PERC 18GX7CM (NEEDLE) IMPLANT
NEEDLE PERC 18GX7CM (NEEDLE) ×1 IMPLANT
PACK CARDIAC CATH (CUSTOM PROCEDURE TRAY) ×1 IMPLANT
PROTECTION STATION PRESSURIZED (MISCELLANEOUS) ×1
SET ATX-X65L (MISCELLANEOUS) IMPLANT
SHEATH AVANTI 7FRX11 (SHEATH) IMPLANT
SHEATH GLIDE SLENDER 4/5FR (SHEATH) IMPLANT
STATION PROTECTION PRESSURIZED (MISCELLANEOUS) IMPLANT
WIRE HITORQ VERSACORE ST 145CM (WIRE) IMPLANT

## 2023-09-22 NOTE — Progress Notes (Signed)
[facility] HF Stewardship  PCP: [name], MD  PCP-Cardiologist: None  HPI: Paige Higgins is a 77 y.o. female with  asthmatic bronchitis with recent acute exacerbation, hypertension, OSA noncompliant with CPAP, chronic sinus tachycardia, hyperlipidemia  who presented with shortness of breath. Found to have rhinovirus/enterovirus. Troponin ~40 on admission. BNP elevated at 611. TTE this admission showed LVEF 30-35% with moderate MR and mild AR. Cardiology planning LHC today.  Pertinent Lab Values: Creatinine, Ser  Date Value Ref Range Status  09/22/2023 1.00 0.44 - 1.00 mg/dL Final   BUN  Date Value Ref Range Status  09/22/2023 31 (H) 8 - 23 mg/dL Final   Potassium  Date Value Ref Range Status  09/22/2023 3.6 3.5 - 5.1 mmol/L Final   Sodium  Date Value Ref Range Status  09/22/2023 140 135 - 145 mmol/L Final   B Natriuretic Peptide  Date Value Ref Range Status  09/19/2023 611.0 (H) 0.0 - 100.0 pg/mL Final    Comment:    Performed at [facility], [address] [place], [place] [address]   Magnesium  Date Value Ref Range Status  09/20/2023 2.1 1.7 - 2.4 mg/dL Final    Comment:    Performed at [facility], [address] [place], [place] [address]   Hgb A1c MFr Bld  Date Value Ref Range Status  03/06/2023 5.8 (H) 4.8 - 5.6 % Final    Comment:    (NOTE)         Prediabetes: 5.7 - 6.4         Diabetes: >6.4         Glycemic control for adults with diabetes: <7.0    TSH  Date Value Ref Range Status  09/19/2023 2.106 0.350 - 4.500 uIU/mL Final    Comment:    Performed by a 3rd Generation assay with a functional sensitivity of <=0.01 uIU/mL. Performed at [facility], [address] [place], [place] [address]     Vital Signs: Temp:  [97.7 F (36.5 C)-99.1 F (37.3 C)] 97.7 F (36.5 C) (10/14 0741) Pulse Rate:  [84-103] 87 (10/14 0741) Cardiac Rhythm: Normal sinus rhythm (10/14 0700) Resp:  [18-20] 20 (10/14 0741) BP:  (114-124)/(63-82) 115/75 (10/14 0741) SpO2:  [95 %-98 %] 95 % (10/14 0741) Weight:  [73.4 kg (161 lb 12.8 oz)] 73.4 kg (161 lb 12.8 oz) (10/14 0326)   Intake/Output Summary (Last 24 hours) at 09/22/2023 0837 Last data filed at 09/22/2023 0331 Gross per 24 hour  Intake 1378.24 ml  Output 400 ml  Net 978.24 ml    Current Inpatient Medications:  -Carvedilol 3.125 mg BID -Furosemide 40 mg IV daily  Prior to admission HF Medications:  -None Other vasoactive medications include triamterine/HCTZ  Assessment: 1. Acute heart failure (LVEF 30-35%), due to unknown etiology. NYHA class II symptoms.  -Symptoms: Denies shortness of breath and LEE today.  -Volume: Appears nearing euvolemia -Hemodynamics: BP WNL and HR 80s. -BB: ON carvedilol 3.125 mg BID. Can consider increasing. -ACEI/ARB/ARNI: May be able to start low dose losartan after cath. -MRA: Can consider starting spironolactone to replace prior to admission triamterene.  -SGLT2i: Not ideal given age and recurrent UTIs.    Plan: 1) Medication changes recommended at this time: -Can consider changing home triamterene/hydrochlorothiazide to spironolactone +/- losartan at discharge pending BP and creatinine  2) Patient assistance: -None  3) Education: - Patient has been educated on current HF medications and potential additions to HF medication regimen - Patient verbalizes understanding that  over the next few months, these medication doses may change and more medications may be added to optimize HF regimen - Patient has been educated on basic disease state pathophysiology and goals of therapy  Medication Assistance / Insurance Benefits Check:  Does the patient have prescription insurance?  Yes, Medicare  Type of insurance plan:   Does the patient qualify for medication assistance through manufacturers or grants? Pending  Outpatient Pharmacy:  Prior to admission outpatient pharmacy: [facility]     Please do not hesitate to  reach out with questions or concerns,  Enos Fling, PharmD, CPP, BCPS Heart Failure Pharmacist  Phone - [phone] 09/22/2023 11:10 AM

## 2023-09-22 NOTE — Progress Notes (Signed)
[facility] Cardiology PROGRESS NOTE  SUBJECTIVE: Patient sitting in bed, reports she feels well. States that she has felt much better since yesterday. Denies any SOB, chest pain, palpitations. Only complaint is congestion. BP and HR stable. Diuresing well, renal function stable.    Vitals:   09/21/23 2022 09/21/23 2315 09/22/23 0326 09/22/23 0741  BP: 123/82 124/63 124/82 115/75  Pulse: 84 87 91 87  Resp: 18 18 18 20   Temp: 98.2 F (36.8 C) 98.4 F (36.9 C) 98.5 F (36.9 C) 97.7 F (36.5 C)  TempSrc: Oral Oral Oral   SpO2: 96% 98% 96% 95%  Weight:   73.4 kg   Height:         Intake/Output Summary (Last 24 hours) at 09/22/2023 1137 Last data filed at 09/22/2023 1000 Gross per 24 hour  Intake 1623.19 ml  Output 400 ml  Net 1223.19 ml      PHYSICAL EXAM  General: Well developed, well nourished, in no acute distress sitting upright in hospital bed. HEENT:  Normocephalic and atramatic Neck:  No JVD.  Lungs: Clear bilaterally to auscultation and percussion. Heart: HRRR . Normal S1 and S2 without gallops or murmurs.  Abdomen: Bowel sounds are positive, abdomen soft and non-tender  Msk:  Back normal, normal gait. Normal strength and tone for age. Extremities: No clubbing, cyanosis or edema.   Neuro: Alert and oriented X 3. Psych:  Good affect, responds appropriately   LABS: Basic Metabolic Panel: Recent Labs    09/20/23 0453 09/21/23 0240 09/22/23 0417  NA 139 139 140  K 3.4* 3.8 3.6  CL 102 102 103  CO2 28 26 26   GLUCOSE 103* 115* 101*  BUN 19 25* 31*  CREATININE 0.92 1.02* 1.00  CALCIUM 8.5* 8.8* 8.6*  MG 2.1  --   --    Liver Function Tests: No results for input(s): "AST", "ALT", "ALKPHOS", "BILITOT", "PROT", "ALBUMIN" in the last 72 hours. No results for input(s): "LIPASE", "AMYLASE" in the last 72 hours. CBC: Recent Labs    09/21/23 0240 09/22/23 0417  WBC 9.3 10.3  NEUTROABS 6.7 7.4  HGB 13.7 13.4  HCT 43.4 41.5  MCV 89.9 88.5  PLT 363 337    Cardiac Enzymes: No results for input(s): "CKTOTAL", "CKMB", "CKMBINDEX", "TROPONINI" in the last 72 hours. BNP: Invalid input(s): "POCBNP" D-Dimer: No results for input(s): "DDIMER" in the last 72 hours.  Hemoglobin A1C: No results for input(s): "HGBA1C" in the last 72 hours. Fasting Lipid Panel: No results for input(s): "CHOL", "HDL", "LDLCALC", "TRIG", "CHOLHDL", "LDLDIRECT" in the last 72 hours. Thyroid Function Tests: Recent Labs    09/19/23 1226  TSH 2.106   Anemia Panel: No results for input(s): "VITAMINB12", "FOLATE", "FERRITIN", "TIBC", "IRON", "RETICCTPCT" in the last 72 hours.  No results found.  Recent Results (from the past 16109 hour(s))  ECHOCARDIOGRAM COMPLETE   Collection Time: 09/20/23 11:08 AM  Result Value   Weight 2,640   Height 62   BP 135/73   Ao pk vel 1.35   AR max vel 2.34   AV Peak grad 7.3   S' Lateral 5.20   Area-P 1/2 5.23   P 1/2 time 329   MV M vel 4.25   MV Peak grad 72.1   Est EF 30 - 35%   Narrative      ECHOCARDIOGRAM REPORT       Patient Name:   Paige Higgins Date of Exam: 09/20/2023 Medical Rec #:  [id number]  Height:       62.0 in Accession #:    [id number]          Weight:       165.0 lb Date of Birth:  07-11-1946           BSA:          1.762 m Patient Age:    77 years            BP:           121/85 mmHg Patient Gender: F                   HR:           93 bpm. Exam Location:  [facility]  Procedure: 2D Echo, Cardiac Doppler, Color Doppler and Intracardiac            Opacification Agent  Indications:     CHF-Acute Systolic I50.21   History:         Patient has no prior history of Echocardiogram examinations.                  Risk Factors:Hypertension, Dyslipidemia and Sleep Apnea.   Sonographer:     Lucendia Herrlich RCS Referring Phys:  [id number] Verdene Lennert Diagnosing Phys: Marcina Millard MD  IMPRESSIONS    1. Left ventricular ejection fraction, by estimation, is 30 to 35%. The left ventricle  has moderately decreased function. The left ventricle has no regional wall motion abnormalities. The left ventricular internal cavity size was moderately dilated.  Left ventricular diastolic parameters were normal.  2. Right ventricular systolic function is normal. The right ventricular size is normal.  3. The mitral valve is normal in structure. Moderate mitral valve regurgitation. No evidence of mitral stenosis.  4. The aortic valve is normal in structure. Aortic valve regurgitation is mild. No aortic stenosis is present.  5. The inferior vena cava is normal in size with greater than 50% respiratory variability, suggesting right atrial pressure of 3 mmHg.  FINDINGS  Left Ventricle: Left ventricular ejection fraction, by estimation, is 30 to 35%. The left ventricle has moderately decreased function. The left ventricle has no regional wall motion abnormalities. Definity contrast agent was given IV to delineate the  left ventricular endocardial borders. The left ventricular internal cavity size was moderately dilated. There is no left ventricular hypertrophy. Left ventricular diastolic parameters were normal.  Right Ventricle: The right ventricular size is normal. No increase in right ventricular wall thickness. Right ventricular systolic function is normal.  Left Atrium: Left atrial size was normal in size.  Right Atrium: Right atrial size was normal in size.  Pericardium: There is no evidence of pericardial effusion.  Mitral Valve: The mitral valve is normal in structure. Moderate mitral valve regurgitation. No evidence of mitral valve stenosis.  Tricuspid Valve: The tricuspid valve is normal in structure. Tricuspid valve regurgitation is not demonstrated. No evidence of tricuspid stenosis.  Aortic Valve: The aortic valve is normal in structure. Aortic valve regurgitation is mild. Aortic regurgitation PHT measures 329 msec. No aortic stenosis is present. Aortic valve peak gradient measures  7.3 mmHg.  Pulmonic Valve: The pulmonic valve was normal in structure. Pulmonic valve regurgitation is not visualized. No evidence of pulmonic stenosis.  Aorta: The aortic root is normal in size and structure.  Venous: The inferior vena cava is normal in size with greater than 50% respiratory variability, suggesting right atrial pressure of 3 mmHg.  IAS/Shunts: No atrial level shunt  detected by color flow Doppler.    LEFT VENTRICLE PLAX 2D LVIDd:         6.10 cm   Diastology LVIDs:         5.20 cm   LV e' medial:    5.07 cm/s LV PW:         0.90 cm   LV E/e' medial:  18.9 LV IVS:        0.50 cm   LV e' lateral:   8.36 cm/s LVOT diam:     2.20 cm   LV E/e' lateral: 11.4 LV SV:         50 LV SV Index:   28 LVOT Area:     3.80 cm    RIGHT VENTRICLE             IVC RV S prime:     12.70 cm/s  IVC diam: 1.10 cm TAPSE (M-mode): 2.2 cm  LEFT ATRIUM             Index        RIGHT ATRIUM           Index LA diam:        4.60 cm 2.61 cm/m   RA Area:     16.60 cm LA Vol (A2C):   61.6 ml 34.97 ml/m  RA Volume:   42.60 ml  24.18 ml/m LA Vol (A4C):   61.4 ml 34.85 ml/m LA Biplane Vol: 55.3 ml 31.39 ml/m  AORTIC VALVE AV Area (Vmax): 2.34 cm AV Vmax:        135.00 cm/s AV Peak Grad:   7.3 mmHg LVOT Vmax:      83.03 cm/s LVOT Vmean:     54.500 cm/s LVOT VTI:       0.131 m AI PHT:         329 msec   AORTA Ao Root diam: 3.40 cm Ao Asc diam:  3.40 cm  MITRAL VALVE               TRICUSPID VALVE MV Area (PHT): 5.23 cm    TR Peak grad:   25.6 mmHg MV Decel Time: 145 msec    TR Vmax:        253.00 cm/s MR Peak grad: 72.1 mmHg MR Vmax:      424.67 cm/s  SHUNTS MV E velocity: 95.70 cm/s  Systemic VTI:  0.13 m MV A velocity: 62.60 cm/s  Systemic Diam: 2.20 cm MV E/A ratio:  1.53  Marcina Millard MD Electronically signed by Marcina Millard MD Signature Date/Time: 09/20/2023/3:07:09 PM       Final     *Note: Due to a large number of results and/or encounters for the  requested time period, some results have not been displayed. A complete set of results can be found in Results Review.    TELEMETRY (09/22/23): Sinus rhythm rate 90s   Data reviewed (09/22/23): last 24h vitals tele labs imaging I/O hospitalist progress note, heart failure pharmacist note  ASSESSMENT AND PLAN:  Principal Problem:   Acute heart failure (HCC) Active Problems:   Asthma exacerbation   Essential hypertension   OSA (obstructive sleep apnea)   Headache    1. Acute HFrEF, BNP only 611, chest CT revealing severe left ventricular dilatation, cardiomegaly, contrast reflux into IVC and hepatic veins concerning for right heart failure, with pulmonary interstitial edema, echo this admission on 09/20/2023 revealed LVEF 30-35% with moderate mitral regurgitation.  Patient appears euvolemic, on furosemide 40 mg  IV daily. 2.  Asthma exacerbation, on doxycycline and prednisone with DuoNebs as needed   Recommendations   1.  Agree with current therapy 2.  Continue diuresis for now, carefully monitor renal status with diuresis. Consider changing to po diuresis tomorrow. 3.  Start low dose lisinopril 2.5 mg daily. Continue carvedilol 3.125 mg twice daily. 4.  Discussed the risks and benefits of proceeding with LHC for further evaluation with the patient.  She is agreeable to proceed.  NPO until LHC this afternoon (09/22/23) with Dr. Darrold Junker.  Written consent will be obtained.  Further recommendations following LHC.    SignedGale Journey, PA-C  09/22/2023 11:37 AM

## 2023-09-22 NOTE — Discharge Summary (Signed)
Physician Discharge Summary   Patient: Paige Higgins MRN: [id number] DOB: 1946-02-06  Admit date:     09/19/2023  Discharge date: 09/22/23  Discharge Physician: Loyce Dys   PCP: [name], MD   Recommendations at discharge:  Follow-up with cardiologist  Discharge Diagnoses:  Acute systolic congestive heart failure (HCC) Acute on chronic asthma Chronic headache OSA (obstructive sleep apnea)  Hospital Course: Paige Higgins is a 77 y.o. female with medical history significant of asthmatic bronchitis with recent acute exacerbation, hypertension, OSA noncompliant with CPAP, chronic sinus tachycardia, hyperlipidemia, who presents to the ED due to shortness of breath.  Echocardiogram showed ejection fraction 30 to 35%.  Patient was seen by cardiologist and underwent cardiac catheterization on 09/22/2023 that did not show any obstructive coronary artery disease.  Patient has been cleared by cardiologist for discharge today and will follow-up as an outpatient for adjustment of medications to achieve optimal GDMT.    Consultants: Cardiology Procedures performed: As mentioned above Disposition: Home Diet recommendation:  Cardiac diet DISCHARGE MEDICATION: Allergies as of 09/22/2023   No Known Allergies      Medication List     STOP taking these medications    cephALEXin 500 MG capsule Commonly known as: KEFLEX   doxycycline 100 MG tablet Commonly known as: VIBRA-TABS   fluticasone 50 MCG/ACT nasal spray Commonly known as: FLONASE   predniSONE 10 MG tablet Commonly known as: DELTASONE       TAKE these medications    AeroChamber MV inhaler Use as instructed   albuterol (2.5 MG/3ML) 0.083% nebulizer solution Commonly known as: PROVENTIL Take 2.5 mg by nebulization 4 (four) times daily.   albuterol 108 (90 Base) MCG/ACT inhaler Commonly known as: VENTOLIN HFA Inhale 2 puffs into the lungs every 4 (four) hours as needed. For wheezing.   aspirin  EC 81 MG tablet Take 81 mg by mouth daily.   aspirin-acetaminophen-caffeine 250-250-65 MG tablet Commonly known as: EXCEDRIN MIGRAINE Take 2 tablets by mouth every 6 (six) hours as needed for headache or migraine.   atorvastatin 40 MG tablet Commonly known as: LIPITOR Take 40 mg by mouth daily.   budesonide-formoterol 160-4.5 MCG/ACT inhaler Commonly known as: Symbicort Inhale 2 puffs into the lungs in the morning and at bedtime.   carvedilol 3.125 MG tablet Commonly known as: COREG Take 1 tablet (3.125 mg total) by mouth 2 (two) times daily with a meal.   chlorpheniramine 4 MG tablet Commonly known as: CHLOR-TRIMETON Take 4 mg by mouth 2 (two) times daily as needed for allergies.   diclofenac sodium 1 % Gel Commonly known as: VOLTAREN Apply 2 g topically 3 (three) times daily as needed (joint pain).   HYDROcodone-acetaminophen 5-325 MG tablet Commonly known as: NORCO/VICODIN Take 1 tablet by mouth daily as needed.   ipratropium 0.06 % nasal spray Commonly known as: ATROVENT Place 2 sprays into both nostrils 2 (two) times a day.   lisinopril 2.5 MG tablet Commonly known as: ZESTRIL Take 1 tablet (2.5 mg total) by mouth daily. Start taking on: September 23, 2023   montelukast 10 MG tablet Commonly known as: SINGULAIR Take 1 tablet by mouth at bedtime.   multivitamin tablet Take 1 tablet by mouth daily.   pantoprazole 40 MG tablet Commonly known as: PROTONIX Take 40 mg by mouth daily.   SUMAtriptan 50 MG tablet Commonly known as: IMITREX Take 50 mg by mouth every 2 (two) hours as needed for migraine.   Trelegy Ellipta 200-62.5-25 MCG/ACT Aepb Generic drug:  Fluticasone-Umeclidin-Vilant Inhale 1 puff into the lungs daily.   vitamin C 1000 MG tablet Take 1,000 mg by mouth daily.        Follow-up Information     Germaine Pomfret, Eliane Decree, NP. Go in 1 week(s).   Specialty: Nurse Practitioner Why: Follow up scheduled on 10/23 with Marijo Conception, NP. Heart  failure appointment scheduled on 10/22 at [facility]. Patient given appointment paperwork. Contact information: [address] [place] [address] [phone]                Discharge Exam: Ceasar Mons Weights   09/19/23 0904 09/22/23 0326  Weight: 74.8 kg 73.4 kg   General: Elderly female in no acute distress HENT:     Head: Normocephalic and atraumatic.  Eyes:     Extraocular Movements: Extraocular movements intact.     Pupils: Pupils are equal, round, and reactive to light.  Cardiovascular:     Rate and Rhythm: Normal rate and regular rhythm.  Pulmonary: Bilateral basal crackles heard Abdominal:     Palpations: Abdomen is soft.     Tenderness: There is no abdominal tenderness.  Musculoskeletal:     Right lower leg: No edema.     Left lower leg: No edema.  Skin:    General: Skin is warm and dry.  Neurological:     General: No focal deficit present.     Mental Status: She is alert and oriented to person, place, and time.     Motor: No weakness.  Psychiatric:        Mood and Affect: Mood normal.        Behavior: Behavior normal.   Condition at discharge: good  The results of significant diagnostics from this hospitalization (including imaging, microbiology, ancillary and laboratory) are listed below for reference.   Imaging Studies: CARDIAC CATHETERIZATION  Result Date: 09/22/2023   Prox LAD lesion is 20% stenosed.   Prox Cx lesion is 20% stenosed.   There is mild to moderate left ventricular systolic dysfunction.   The left ventricular ejection fraction is 35-45% by visual estimate.   Hemodynamic findings consistent with pulmonary hypertension.   There is mild mitral valve stenosis and mild (2+) mitral regurgitation. 1.  Insignificant coronary artery disease 2.  Mild to moderate reduced left ventricular function with estimated LV ejection fraction 35-40% 3.  Normal right-sided heart pressures 4.  Mild mitral regurgitation Recommendations 1.  Medical therapy  2.  Good medical management for HFrEF 3.  May discharge later today or in a.m. 4.  Follow-up with me 1 to 2 weeks   ECHOCARDIOGRAM COMPLETE  Result Date: 09/20/2023    ECHOCARDIOGRAM REPORT   Patient Name:   Paige Higgins Date of Exam: 09/20/2023 Medical Rec #:  [id number]           Height:       62.0 in Accession #:    [id number]          Weight:       165.0 lb Date of Birth:  January 22, 1946           BSA:          1.762 m Patient Age:    77 years            BP:           121/85 mmHg Patient Gender: F                   HR:  93 bpm. Exam Location:  [facility] Procedure: 2D Echo, Cardiac Doppler, Color Doppler and Intracardiac            Opacification Agent Indications:     CHF-Acute Systolic I50.21  History:         Patient has no prior history of Echocardiogram examinations.                  Risk Factors:Hypertension, Dyslipidemia and Sleep Apnea.  Sonographer:     Lucendia Herrlich RCS Referring Phys:  [id number] Verdene Lennert Diagnosing Phys: Marcina Millard MD IMPRESSIONS  1. Left ventricular ejection fraction, by estimation, is 30 to 35%. The left ventricle has moderately decreased function. The left ventricle has no regional wall motion abnormalities. The left ventricular internal cavity size was moderately dilated. Left ventricular diastolic parameters were normal.  2. Right ventricular systolic function is normal. The right ventricular size is normal.  3. The mitral valve is normal in structure. Moderate mitral valve regurgitation. No evidence of mitral stenosis.  4. The aortic valve is normal in structure. Aortic valve regurgitation is mild. No aortic stenosis is present.  5. The inferior vena cava is normal in size with greater than 50% respiratory variability, suggesting right atrial pressure of 3 mmHg. FINDINGS  Left Ventricle: Left ventricular ejection fraction, by estimation, is 30 to 35%. The left ventricle has moderately decreased function. The left ventricle has no regional wall motion  abnormalities. Definity contrast agent was given IV to delineate the left ventricular endocardial borders. The left ventricular internal cavity size was moderately dilated. There is no left ventricular hypertrophy. Left ventricular diastolic parameters were normal. Right Ventricle: The right ventricular size is normal. No increase in right ventricular wall thickness. Right ventricular systolic function is normal. Left Atrium: Left atrial size was normal in size. Right Atrium: Right atrial size was normal in size. Pericardium: There is no evidence of pericardial effusion. Mitral Valve: The mitral valve is normal in structure. Moderate mitral valve regurgitation. No evidence of mitral valve stenosis. Tricuspid Valve: The tricuspid valve is normal in structure. Tricuspid valve regurgitation is not demonstrated. No evidence of tricuspid stenosis. Aortic Valve: The aortic valve is normal in structure. Aortic valve regurgitation is mild. Aortic regurgitation PHT measures 329 msec. No aortic stenosis is present. Aortic valve peak gradient measures 7.3 mmHg. Pulmonic Valve: The pulmonic valve was normal in structure. Pulmonic valve regurgitation is not visualized. No evidence of pulmonic stenosis. Aorta: The aortic root is normal in size and structure. Venous: The inferior vena cava is normal in size with greater than 50% respiratory variability, suggesting right atrial pressure of 3 mmHg. IAS/Shunts: No atrial level shunt detected by color flow Doppler.  LEFT VENTRICLE PLAX 2D LVIDd:         6.10 cm   Diastology LVIDs:         5.20 cm   LV e' medial:    5.07 cm/s LV PW:         0.90 cm   LV E/e' medial:  18.9 LV IVS:        0.50 cm   LV e' lateral:   8.36 cm/s LVOT diam:     2.20 cm   LV E/e' lateral: 11.4 LV SV:         50 LV SV Index:   28 LVOT Area:     3.80 cm  RIGHT VENTRICLE             IVC RV S prime:  12.70 cm/s  IVC diam: 1.10 cm TAPSE (M-mode): 2.2 cm LEFT ATRIUM             Index        RIGHT ATRIUM            Index LA diam:        4.60 cm 2.61 cm/m   RA Area:     16.60 cm LA Vol (A2C):   61.6 ml 34.97 ml/m  RA Volume:   42.60 ml  24.18 ml/m LA Vol (A4C):   61.4 ml 34.85 ml/m LA Biplane Vol: 55.3 ml 31.39 ml/m  AORTIC VALVE AV Area (Vmax): 2.34 cm AV Vmax:        135.00 cm/s AV Peak Grad:   7.3 mmHg LVOT Vmax:      83.03 cm/s LVOT Vmean:     54.500 cm/s LVOT VTI:       0.131 m AI PHT:         329 msec  AORTA Ao Root diam: 3.40 cm Ao Asc diam:  3.40 cm MITRAL VALVE               TRICUSPID VALVE MV Area (PHT): 5.23 cm    TR Peak grad:   25.6 mmHg MV Decel Time: 145 msec    TR Vmax:        253.00 cm/s MR Peak grad: 72.1 mmHg MR Vmax:      424.67 cm/s  SHUNTS MV E velocity: 95.70 cm/s  Systemic VTI:  0.13 m MV A velocity: 62.60 cm/s  Systemic Diam: 2.20 cm MV E/A ratio:  1.53 Marcina Millard MD Electronically signed by Marcina Millard MD Signature Date/Time: 09/20/2023/3:07:09 PM    Final    CT Angio Chest PE W and/or Wo Contrast  Result Date: 09/19/2023 CLINICAL DATA:  Shortness of breath and productive cough. EXAM: CT ANGIOGRAPHY CHEST WITH CONTRAST TECHNIQUE: Multidetector CT imaging of the chest was performed using the standard protocol during bolus administration of intravenous contrast. Multiplanar CT image reconstructions and MIPs were obtained to evaluate the vascular anatomy. RADIATION DOSE REDUCTION: This exam was performed according to the departmental dose-optimization program which includes automated exposure control, adjustment of the mA and/or kV according to patient size and/or use of iterative reconstruction technique. CONTRAST:  75mL OMNIPAQUE IOHEXOL 350 MG/ML SOLN COMPARISON:  CT a of the chest on 03/06/2023 FINDINGS: Cardiovascular: The pulmonary arteries are adequately opacified. There is no evidence of pulmonary embolism. Central pulmonary arteries are moderately dilated with the main pulmonary artery measuring up to 4.1 cm. The heart is significantly enlarged with severe  dilatation of the left ventricle. Reflux of contrast into the IVC and hepatic veins is consistent with some degree of right heart failure. No pericardial fluid identified. Calcified coronary artery plaque present primarily in the distribution of the LAD. The thoracic aorta demonstrates atherosclerosis without aneurysmal disease. Mediastinum/Nodes: No enlarged mediastinal, hilar, or axillary lymph nodes. Thyroid gland, trachea, and esophagus demonstrate no significant findings. Lungs/Pleura: Lungs demonstrate pulmonary interstitial edema and pulmonary venous hypertension. There is a small right pleural effusion and trace left pleural fluid. No focal airspace consolidation, pulmonary mass or pneumothorax. Upper Abdomen: No acute abnormality. Musculoskeletal: Diffuse degenerative disc disease of the spine. No fractures or bony lesions identified. Review of the MIP images confirms the above findings. IMPRESSION: 1. No evidence of pulmonary embolism. 2. Significant cardiomegaly with severe dilatation of the left ventricle. Reflux of contrast into the IVC and hepatic veins is consistent with some degree  of right heart failure. Correlation with echocardiography may be helpful. 3. Pulmonary interstitial edema and pulmonary venous hypertension with small right pleural effusion and trace left pleural fluid. 4. Dilated central pulmonary arteries consistent with pulmonary arterial hypertension. 5. Calcified coronary artery plaque primarily in the distribution of the LAD. 6. Aortic atherosclerosis without aneurysmal disease. Aortic Atherosclerosis (ICD10-I70.0). Electronically Signed   By: Irish Lack M.D.   On: 09/19/2023 14:51   DG Chest 2 View  Result Date: 09/19/2023 CLINICAL DATA:  Chest pain.  Shortness of breath.  Productive cough. EXAM: CHEST - 2 VIEW COMPARISON:  08/19/2023. FINDINGS: Redemonstration of left retrocardiac opacity partially obscuring left hemidiaphragm and blunting the left lateral costophrenic  angle, favoring combination of left lung lower lobe atelectasis and/or consolidation with small pleural effusion. No significant interval change since the prior study. Bilateral lung fields are otherwise clear. Right lateral costophrenic angle is clear. Stable moderately enlarged cardio-mediastinal silhouette. Presumed cardiac monitor is seen overlying the left upper anterior chest wall. No acute osseous abnormalities. 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 The soft tissues are within normal limits. IMPRESSION: 1. Stable left retrocardiac opacity, favoring combination of left lung lower lobe atelectasis and/or consolidation with small pleural effusion. 2. Stable cardiomegaly. Electronically Signed   By: Jules Schick M.D.   On: 09/19/2023 10:36    Microbiology: Results for orders placed or performed during the hospital encounter of 09/19/23  Resp panel by RT-PCR (RSV, Flu A&B, Covid) Anterior Nasal Swab     Status: None   Collection Time: 09/19/23 12:26 PM   Specimen: Anterior Nasal Swab  Result Value Ref Range Status   SARS Coronavirus 2 by RT PCR NEGATIVE NEGATIVE Final    Comment: (NOTE) SARS-CoV-2 target nucleic acids are NOT DETECTED.  The SARS-CoV-2 RNA is generally detectable in upper respiratory specimens during the acute phase of infection. The lowest concentration of SARS-CoV-2 viral copies this assay can detect is 138 copies/mL. A negative result does not preclude SARS-Cov-2 infection and should not be used as the sole basis for treatment or other patient management decisions. A negative result may occur with  improper specimen collection/handling, submission of specimen other than nasopharyngeal swab, presence of viral mutation(s) within the areas targeted by this assay, and inadequate number of viral copies(<138 copies/mL). A negative result must be combined with clinical observations, patient history, and epidemiological information. The expected result  is Negative.  Fact Sheet for Patients:  BloggerCourse.com  Fact Sheet for Healthcare Providers:  SeriousBroker.it  This test is no t yet approved or cleared by the [place] FDA and  has been authorized for detection and/or diagnosis of SARS-CoV-2 by FDA under an Emergency Use Authorization (EUA). This EUA will remain  in effect (meaning this test can be used) for the duration of the COVID-19 declaration under Section 564(b)(1) of the Act, 21 U.S.C.section 360bbb-3(b)(1), unless the authorization is terminated  or revoked sooner.       Influenza A by PCR NEGATIVE NEGATIVE Final   Influenza B by PCR NEGATIVE NEGATIVE Final    Comment: (NOTE) The Xpert Xpress SARS-CoV-2/FLU/RSV plus assay is intended as an aid in the diagnosis of influenza from Nasopharyngeal swab specimens and should not be used as a sole basis for treatment. Nasal washings and aspirates are unacceptable for Xpert Xpress SARS-CoV-2/FLU/RSV testing.  Fact Sheet for Patients: BloggerCourse.com  Fact Sheet for Healthcare Providers: SeriousBroker.it  This test is not yet approved or cleared by the [place] FDA and has  been authorized for detection and/or diagnosis of SARS-CoV-2 by FDA under an Emergency Use Authorization (EUA). This EUA will remain in effect (meaning this test can be used) for the duration of the COVID-19 declaration under Section 564(b)(1) of the Act, 21 U.S.C. section 360bbb-3(b)(1), unless the authorization is terminated or revoked.     Resp Syncytial Virus by PCR NEGATIVE NEGATIVE Final    Comment: (NOTE) Fact Sheet for Patients: BloggerCourse.com  Fact Sheet for Healthcare Providers: SeriousBroker.it  This test is not yet approved or cleared by the [place] FDA and has been authorized for detection and/or diagnosis of  SARS-CoV-2 by FDA under an Emergency Use Authorization (EUA). This EUA will remain in effect (meaning this test can be used) for the duration of the COVID-19 declaration under Section 564(b)(1) of the Act, 21 U.S.C. section 360bbb-3(b)(1), unless the authorization is terminated or revoked.  Performed at [facility], [address] [place], [place] [address]   Respiratory (~20 pathogens) panel by PCR     Status: None   Collection Time: 09/19/23  4:06 PM   Specimen: Nasopharyngeal Swab; Respiratory  Result Value Ref Range Status   Adenovirus NOT DETECTED NOT DETECTED Final   Coronavirus 229E NOT DETECTED NOT DETECTED Final    Comment: (NOTE) The Coronavirus on the Respiratory Panel, DOES NOT test for the novel  Coronavirus (2019 nCoV)    Coronavirus HKU1 NOT DETECTED NOT DETECTED Final   Coronavirus NL63 NOT DETECTED NOT DETECTED Final   Coronavirus OC43 NOT DETECTED NOT DETECTED Final   Metapneumovirus NOT DETECTED NOT DETECTED Final   Rhinovirus / Enterovirus NOT DETECTED NOT DETECTED Final   Influenza A NOT DETECTED NOT DETECTED Final   Influenza B NOT DETECTED NOT DETECTED Final   Parainfluenza Virus 1 NOT DETECTED NOT DETECTED Final   Parainfluenza Virus 2 NOT DETECTED NOT DETECTED Final   Parainfluenza Virus 3 NOT DETECTED NOT DETECTED Final   Parainfluenza Virus 4 NOT DETECTED NOT DETECTED Final   Respiratory Syncytial Virus NOT DETECTED NOT DETECTED Final   Bordetella pertussis NOT DETECTED NOT DETECTED Final   Bordetella Parapertussis NOT DETECTED NOT DETECTED Final   Chlamydophila pneumoniae NOT DETECTED NOT DETECTED Final   Mycoplasma pneumoniae NOT DETECTED NOT DETECTED Final    Comment: Performed at [facility], [address] [place], [place] [address]    Labs: CBC: Recent Labs  Lab 09/19/23 0908 09/20/23 0453 09/21/23 0240 09/22/23 0417 09/22/23 1402  WBC 8.4 7.9 9.3 10.3  --   NEUTROABS  --  5.1 6.7 7.4  --   HGB 13.4 13.3 13.7 13.4  14.6  HCT 42.7 41.3 43.4 41.5 43.0  MCV 90.3 89.0 89.9 88.5  --   PLT 359 337 363 337  --    Basic Metabolic Panel: Recent Labs  Lab 09/19/23 0908 09/20/23 0453 09/21/23 0240 09/22/23 0417 09/22/23 1402  NA 138 139 139 140 140  K 3.5 3.4* 3.8 3.6 3.4*  CL 102 102 102 103  --   CO2 25 28 26 26   --   GLUCOSE 112* 103* 115* 101*  --   BUN 21 19 25* 31*  --   CREATININE 0.89 0.92 1.02* 1.00  --   CALCIUM 8.6* 8.5* 8.8* 8.6*  --   MG  --  2.1  --   --   --    Liver Function Tests: No results for input(s): "AST", "ALT", "ALKPHOS", "BILITOT", "PROT", "ALBUMIN" in the last 168 hours. CBG: No results for  input(s): "GLUCAP" in the last 168 hours.  Discharge time spent:  39 minutes.  Signed: Loyce Dys, MD [facility] 09/22/2023

## 2023-09-22 NOTE — Plan of Care (Signed)
  Problem: Education: Goal: Knowledge of General Education information will improve Description: Including pain rating scale, medication(s)/side effects and non-pharmacologic comfort measures Outcome: Progressing   Problem: Clinical Measurements: Goal: Ability to maintain clinical measurements within normal limits will improve Outcome: Progressing   

## 2023-09-22 NOTE — Progress Notes (Signed)
Transition of Care [facility]) - Inpatient Brief Assessment   Patient Details  Name: Karter Haire MRN: [id number] Date of Birth: Nov 06, 1946  [facility] CM/SW Contact:    Truddie Hidden, RN Phone Number: 09/22/2023, 10:40 AM   Clinical Narrative: TOC continuing to follow patient's progress throughout discharge planning.   Transition of Care Asessment: Insurance and Status: Insurance coverage has been reviewed Patient has primary care physician: Yes Home environment has been reviewed: home Prior level of function:: independent Prior/Current Home Services: No current home services Social Determinants of Health Reivew: SDOH reviewed no interventions necessary Readmission risk has been reviewed: Yes Transition of care needs: no transition of care needs at this time

## 2023-09-23 ENCOUNTER — Encounter: Payer: Self-pay | Admitting: Cardiology

## 2023-09-23 LAB — POCT I-STAT 7, (LYTES, BLD GAS, ICA,H+H)
Acid-Base Excess: 0 mmol/L (ref 0.0–2.0)
Bicarbonate: 24.7 mmol/L (ref 20.0–28.0)
Calcium, Ion: 0.98 mmol/L — ABNORMAL LOW (ref 1.15–1.40)
HCT: 38 % (ref 36.0–46.0)
Hemoglobin: 12.9 g/dL (ref 12.0–15.0)
O2 Saturation: 91 %
Potassium: 3 mmol/L — ABNORMAL LOW (ref 3.5–5.1)
Sodium: 143 mmol/L (ref 135–145)
TCO2: 26 mmol/L (ref 22–32)
pCO2 arterial: 40.8 mm[Hg] (ref 32–48)
pH, Arterial: 7.39 (ref 7.35–7.45)
pO2, Arterial: 61 mm[Hg] — ABNORMAL LOW (ref 83–108)

## 2023-10-09 ENCOUNTER — Other Ambulatory Visit
Admission: RE | Admit: 2023-10-09 | Discharge: 2023-10-09 | Disposition: A | Discharge status: Home / Self Care | Payer: Medicare Other | Source: Ambulatory Visit | Attending: Nurse Practitioner | Admitting: Nurse Practitioner

## 2023-10-09 DIAGNOSIS — R0609 Other forms of dyspnea: Secondary | ICD-10-CM | POA: Insufficient documentation

## 2023-10-09 DIAGNOSIS — I5020 Unspecified systolic (congestive) heart failure: Secondary | ICD-10-CM | POA: Diagnosis present

## 2023-10-09 DIAGNOSIS — R635 Abnormal weight gain: Secondary | ICD-10-CM | POA: Insufficient documentation

## 2023-10-09 LAB — BRAIN NATRIURETIC PEPTIDE: B Natriuretic Peptide: 1387.4 pg/mL — ABNORMAL HIGH (ref 0.0–100.0)

## 2023-10-10 DIAGNOSIS — R000 Tachycardia, unspecified: Secondary | ICD-10-CM | POA: Insufficient documentation

## 2023-10-16 ENCOUNTER — Other Ambulatory Visit
Admission: RE | Admit: 2023-10-16 | Discharge: 2023-10-16 | Disposition: A | Discharge status: Home / Self Care | Payer: Medicare Other | Source: Ambulatory Visit | Attending: Nurse Practitioner | Admitting: Nurse Practitioner

## 2023-10-16 DIAGNOSIS — I2510 Atherosclerotic heart disease of native coronary artery without angina pectoris: Secondary | ICD-10-CM | POA: Insufficient documentation

## 2023-10-16 DIAGNOSIS — I272 Pulmonary hypertension, unspecified: Secondary | ICD-10-CM | POA: Insufficient documentation

## 2023-10-16 DIAGNOSIS — I5020 Unspecified systolic (congestive) heart failure: Secondary | ICD-10-CM | POA: Diagnosis present

## 2023-10-16 DIAGNOSIS — I471 Supraventricular tachycardia, unspecified: Secondary | ICD-10-CM | POA: Insufficient documentation

## 2023-10-16 LAB — BRAIN NATRIURETIC PEPTIDE: B Natriuretic Peptide: 1151.4 pg/mL — ABNORMAL HIGH (ref 0.0–100.0)

## 2023-10-21 ENCOUNTER — Encounter: Payer: Medicare Other | Attending: Cardiology | Admitting: *Deleted

## 2023-10-21 DIAGNOSIS — I5022 Chronic systolic (congestive) heart failure: Secondary | ICD-10-CM

## 2023-10-21 NOTE — Progress Notes (Signed)
Initial phone call completed. Diagnosis can be found in [facility] 10/11. EP Orientation scheduled for Monday 11/18 at 2:30.

## 2023-10-27 ENCOUNTER — Ambulatory Visit: Payer: Medicare Other

## 2023-10-29 ENCOUNTER — Ambulatory Visit: Payer: Medicare Other

## 2023-10-30 ENCOUNTER — Other Ambulatory Visit
Admission: RE | Admit: 2023-10-30 | Discharge: 2023-10-30 | Disposition: A | Discharge status: Home / Self Care | Payer: Medicare Other | Source: Ambulatory Visit | Attending: Nurse Practitioner | Admitting: Nurse Practitioner

## 2023-10-30 DIAGNOSIS — I10 Essential (primary) hypertension: Secondary | ICD-10-CM | POA: Insufficient documentation

## 2023-10-30 DIAGNOSIS — I5020 Unspecified systolic (congestive) heart failure: Secondary | ICD-10-CM | POA: Insufficient documentation

## 2023-10-30 LAB — BRAIN NATRIURETIC PEPTIDE: B Natriuretic Peptide: 628.9 pg/mL — ABNORMAL HIGH (ref 0.0–100.0)

## 2023-11-18 ENCOUNTER — Ambulatory Visit: Payer: Medicare Other | Admitting: Student in an Organized Health Care Education/Training Program

## 2023-11-24 ENCOUNTER — Ambulatory Visit: Payer: Medicare Other | Admitting: Student in an Organized Health Care Education/Training Program

## 2023-12-30 ENCOUNTER — Ambulatory Visit: Payer: Medicare Other | Admitting: Student in an Organized Health Care Education/Training Program

## 2024-02-03 ENCOUNTER — Other Ambulatory Visit: Payer: Self-pay | Admitting: Family Medicine

## 2024-02-03 DIAGNOSIS — Z1231 Encounter for screening mammogram for malignant neoplasm of breast: Secondary | ICD-10-CM

## 2024-02-27 ENCOUNTER — Encounter: Payer: Self-pay | Admitting: Neurology

## 2024-02-27 ENCOUNTER — Other Ambulatory Visit: Payer: Self-pay

## 2024-02-27 DIAGNOSIS — G629 Polyneuropathy, unspecified: Secondary | ICD-10-CM

## 2024-03-03 ENCOUNTER — Other Ambulatory Visit: Payer: Self-pay

## 2024-03-03 DIAGNOSIS — R202 Paresthesia of skin: Secondary | ICD-10-CM

## 2024-03-10 DIAGNOSIS — I5020 Unspecified systolic (congestive) heart failure: Secondary | ICD-10-CM | POA: Diagnosis not present

## 2024-03-25 ENCOUNTER — Encounter: Admitting: Neurology

## 2024-03-30 DIAGNOSIS — I5020 Unspecified systolic (congestive) heart failure: Secondary | ICD-10-CM | POA: Diagnosis not present

## 2024-04-16 ENCOUNTER — Encounter: Admitting: Neurology

## 2024-05-11 DIAGNOSIS — H2512 Age-related nuclear cataract, left eye: Secondary | ICD-10-CM | POA: Diagnosis not present

## 2024-05-11 DIAGNOSIS — H5213 Myopia, bilateral: Secondary | ICD-10-CM | POA: Diagnosis not present

## 2024-05-11 DIAGNOSIS — H1045 Other chronic allergic conjunctivitis: Secondary | ICD-10-CM | POA: Diagnosis not present

## 2024-05-12 DIAGNOSIS — Z0100 Encounter for examination of eyes and vision without abnormal findings: Secondary | ICD-10-CM | POA: Diagnosis not present

## 2024-05-27 ENCOUNTER — Ambulatory Visit (INDEPENDENT_AMBULATORY_CARE_PROVIDER_SITE_OTHER): Admitting: Neurology

## 2024-05-27 DIAGNOSIS — G5603 Carpal tunnel syndrome, bilateral upper limbs: Secondary | ICD-10-CM

## 2024-05-27 DIAGNOSIS — G629 Polyneuropathy, unspecified: Secondary | ICD-10-CM

## 2024-05-27 DIAGNOSIS — G5622 Lesion of ulnar nerve, left upper limb: Secondary | ICD-10-CM

## 2024-05-27 NOTE — Procedures (Signed)
 [facility] Neurology  [address] [place], [address]  [place], [place] [address] Tel: [phone] Fax: [phone] Test Date:  05/27/2024  Patient: Paige Higgins DOB: [birth year] Physician: Reyna Cava, DO  Sex: Female Height: 5' 2 Ref Phys: Henreitta Locus, NP  ID#: [id number]   Technician:    History: This is a 78 year old female referred for evaluation of bilateral hand paresthesias.  NCV & EMG Findings: Extensive electrodiagnostic testing of the right upper extremity and additional studies of the left shows:  Right median sensory response shows prolonged latency (5.4 ms) and reduced amplitude (6.9 V).  Left sensory responses show prolonged peak latency (L3.9, L3.6 ms).  Right ulnar and radial sensory responses are within normal limits. Right median motor response shows prolonged latency (4.4 ms).  Left ulnar motor response shows reduced amplitude (5.1 mV) and decreased conduction velocity across the elbow (A Elbow-B Elbow, 30 m/s).  Right ulnar and left motor responses are within normal limit  There is no evidence of active or chronic motor axonal loss changes affecting any of the tested muscles.  Motor unit configuration and recruitment pattern is within normal limits.  Impression: Right median neuropathy at or distal to the wrist, consistent with a clinical diagnosis of carpal tunnel syndrome, moderate-to-severe. 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 Left median neuropathy at or distal to the wrist, consistent with a clinical diagnosis of carpal tunnel syndrome, mild. There is no evidence of a polyneuropathy or cervical radiculopathy affecting the upper extremities.   ___________________________ Reyna Cava, DO    Nerve Conduction Studies   Stim Site NR Peak (ms) Norm Peak (ms) O-P Amp (V) Norm O-P Amp  Left Median Anti Sensory (2nd Digit)  32 C  Wrist    *3.9 <3.8 24.5 >10  Right Median Anti  Sensory (2nd Digit)  32 C  Wrist    *5.4 <3.8 *6.9 >10  Left Radial Anti Sensory (Base 1st Digit)  32 C  Wrist    2.4 <2.8 16.0 >10  Right Radial Anti Sensory (Base 1st Digit)  32 C  Wrist    2.5 <2.8 15.2 >10  Left Ulnar Anti Sensory (5th Digit)  32 C  Wrist    *3.6 <3.2 9.0 >5  Right Ulnar Anti Sensory (5th Digit)  32 C  Wrist    3.1 <3.2 18.8 >5     Stim Site NR Onset (ms) Norm Onset (ms) O-P Amp (mV) Norm O-P Amp Site1 Site2 Delta-0 (ms) Dist (cm) Vel (m/s) Norm Vel (m/s)  Left Median Motor (Abd Poll Brev)  32 C  Wrist    3.0 <4.0 9.0 >5 Elbow Wrist 5.6 29.0 52 >50  Elbow    8.6  8.3         Right Median Motor (Abd Poll Brev)  32 C  Wrist    *4.4 <4.0 8.7 >5 Elbow Wrist 5.9 33.0 56 >50  Elbow    10.3  8.7         Left Ulnar Motor (Abd Dig Minimi)  32 C  Wrist    2.9 <3.1 *5.1 >7 B Elbow Wrist 4.0 20.0 50 >50  B Elbow    6.9  4.7  A Elbow B Elbow 3.3 10.0 *30 >50  A Elbow    10.2  4.6         Right Ulnar Motor (Abd Dig Minimi)  32 C  Wrist    2.6 <3.1 7.5 >7 B Elbow Wrist 3.9  21.0 54 >50  B Elbow    6.5  7.0  A Elbow B Elbow 1.8 10.0 56 >50  A Elbow    8.3  6.4          Electromyography   Side Muscle Ins.Act Fibs Fasc Recrt Amp Dur Poly Activation Comment  Right 1stDorInt Nml Nml Nml Nml Nml Nml Nml Nml N/A  Right Abd Poll Brev Nml Nml Nml Nml Nml Nml Nml Nml N/A  Right PronatorTeres Nml Nml Nml Nml Nml Nml Nml Nml N/A  Right Biceps Nml Nml Nml Nml Nml Nml Nml Nml N/A  Right Triceps Nml Nml Nml Nml Nml Nml Nml Nml N/A  Right Deltoid Nml Nml Nml Nml Nml Nml Nml Nml N/A  Left 1stDorInt Nml Nml Nml Nml Nml Nml Nml Nml N/A  Left Abd Poll Brev Nml Nml Nml Nml Nml Nml Nml Nml N/A  Left PronatorTeres Nml Nml Nml Nml Nml Nml Nml Nml N/A  Left Biceps Nml Nml Nml Nml Nml Nml Nml Nml N/A  Left Triceps Nml Nml Nml Nml Nml Nml Nml Nml N/A  Left Deltoid Nml Nml Nml Nml Nml Nml Nml Nml N/A  Left Abd Dig Min Nml Nml Nml Nml Nml Nml Nml Nml N/A  Left FlexCarpiUln Nml Nml Nml  Nml Nml Nml Nml Nml N/A      Waveforms:

## 2024-06-02 DIAGNOSIS — G5792 Unspecified mononeuropathy of left lower limb: Secondary | ICD-10-CM | POA: Diagnosis not present

## 2024-06-02 DIAGNOSIS — G5791 Unspecified mononeuropathy of right lower limb: Secondary | ICD-10-CM | POA: Diagnosis not present

## 2024-06-02 DIAGNOSIS — G5793 Unspecified mononeuropathy of bilateral lower limbs: Secondary | ICD-10-CM | POA: Diagnosis not present

## 2024-06-04 DIAGNOSIS — I10 Essential (primary) hypertension: Secondary | ICD-10-CM | POA: Diagnosis not present

## 2024-06-04 DIAGNOSIS — J328 Other chronic sinusitis: Secondary | ICD-10-CM | POA: Diagnosis not present

## 2024-06-04 DIAGNOSIS — M25512 Pain in left shoulder: Secondary | ICD-10-CM | POA: Diagnosis not present

## 2024-06-08 DIAGNOSIS — H18413 Arcus senilis, bilateral: Secondary | ICD-10-CM | POA: Diagnosis not present

## 2024-06-08 DIAGNOSIS — Z961 Presence of intraocular lens: Secondary | ICD-10-CM | POA: Diagnosis not present

## 2024-06-08 DIAGNOSIS — H2512 Age-related nuclear cataract, left eye: Secondary | ICD-10-CM | POA: Diagnosis not present

## 2024-06-08 DIAGNOSIS — H25012 Cortical age-related cataract, left eye: Secondary | ICD-10-CM | POA: Diagnosis not present

## 2024-06-14 DIAGNOSIS — Z9889 Other specified postprocedural states: Secondary | ICD-10-CM | POA: Diagnosis not present

## 2024-06-14 DIAGNOSIS — I272 Pulmonary hypertension, unspecified: Secondary | ICD-10-CM | POA: Diagnosis not present

## 2024-06-14 DIAGNOSIS — G4733 Obstructive sleep apnea (adult) (pediatric): Secondary | ICD-10-CM | POA: Diagnosis not present

## 2024-06-14 DIAGNOSIS — E782 Mixed hyperlipidemia: Secondary | ICD-10-CM | POA: Diagnosis not present

## 2024-06-14 DIAGNOSIS — I5020 Unspecified systolic (congestive) heart failure: Secondary | ICD-10-CM | POA: Diagnosis not present

## 2024-06-14 DIAGNOSIS — I2510 Atherosclerotic heart disease of native coronary artery without angina pectoris: Secondary | ICD-10-CM | POA: Diagnosis not present

## 2024-06-14 DIAGNOSIS — I10 Essential (primary) hypertension: Secondary | ICD-10-CM | POA: Diagnosis not present

## 2024-06-14 DIAGNOSIS — J439 Emphysema, unspecified: Secondary | ICD-10-CM | POA: Diagnosis not present

## 2024-06-14 DIAGNOSIS — I340 Nonrheumatic mitral (valve) insufficiency: Secondary | ICD-10-CM | POA: Diagnosis not present

## 2024-07-16 NOTE — Progress Notes (Deleted)
 Cardiology Office Note  Date:  07/16/2024   ID:  Paige, Higgins 18-Sep-1946, MRN [id number]  PCP:  Valora Agent, MD   No chief complaint on file.   HPI:  Paige Higgins a 78 y.o. femalewith past medical history of: HTN HLD OSA noncompliant with CPAP Non-obstructive CAD by [facility] 09/22/23 prox LAD 20% stenosis, prox LCX 20% stenosis. LVEF 35-45% HFrEF - diagnosis 09/20/23  EF 30-35% on 09/20/23  EF 35-40% on 01/05/24 ECHO LHC 09/22/23 showed non-obstructive CAD and pulmonary hypertension Pulmonary hypertension - per [facility] 09/22/23 pSVT - by 72-hour Holter 09/18/23 showing 6% SVE burden with 46 occurrences of SVT with fastest episode 170 bpm and longest episode 15 beats 10. Asthma/COPD  Presents by referral from Esmond Valora for coronary disease, cardiomyopathy  Current Medications for Heart Failure: Carvedilol  6.25 mg twice daily Entresto 24-26 mg, 1 tablet twice daily Furosemide  10 mg (1/2 tablet) daily as needed Jardiance (empagliflozin) 1 tablet daily   PMH:   has a past medical history of Allergies, Arthritis, Asthma, Depression, Headache(784.0), Heart murmur, Hyperlipidemia, Hypertension, PONV (postoperative nausea and vomiting), Recurrent UTI, Sleep apnea, Spondylolisthesis of lumbar region, and Wears contact lenses.  PSH:    Past Surgical History:  Procedure Laterality Date   ABDOMINAL HYSTERECTOMY     ARTHROPLASTY  07/07/2012   LEFT SHOULDER   BREAST BIOPSY Left 2000   CARDIAC CATHETERIZATION     COLONOSCOPY     COLONOSCOPY WITH PROPOFOL  N/A 02/03/2020   Procedure: COLONOSCOPY WITH PROPOFOL ;  Surgeon: Toledo, Ladell POUR, MD;  Location: [facility] ENDOSCOPY;  Service: Gastroenterology;  Laterality: N/A;   JOINT REPLACEMENT     bilateral knee replacements   LUMBAR LAMINECTOMY/DECOMPRESSION MICRODISCECTOMY N/A 04/28/2014   Procedure: LUMBAR THREE TO FOUR, LUMBAR FOUR TO FIVE  LAMINECTOMY/DECOMPRESSION MICRODISCECTOMY 2 LEVELS;  Surgeon: Reyes JONETTA Budge, MD;   Location: [facility] NEURO [facility]S;  Service: Neurosurgery;  Laterality: N/A;  L34 L45 laminectomies   REVERSE SHOULDER ARTHROPLASTY  07/07/2012   Procedure: REVERSE SHOULDER ARTHROPLASTY;  Surgeon: Eva Elsie Herring, MD;  Location: [facility] OR;  Service: Orthopedics;  Laterality: Left;   REVERSE SHOULDER ARTHROPLASTY Right 03/10/2018   Procedure: REVERSE SHOULDER ARTHROPLASTY;  Surgeon: Edie Norleen PARAS, MD;  Location: [facility] ORS;  Service: Orthopedics;  Laterality: Right;   RIGHT/LEFT HEART CATH AND CORONARY ANGIOGRAPHY N/A 09/22/2023   Procedure: RIGHT/LEFT HEART CATH AND CORONARY ANGIOGRAPHY;  Surgeon: Ammon Blunt, MD;  Location: [facility] INVASIVE CV LAB;  Service: Cardiovascular;  Laterality: N/A;   SHOULDER ARTHROSCOPY     right shoulder    Current Outpatient Medications  Medication Sig Dispense Refill   albuterol  (PROVENTIL ) (2.5 MG/3ML) 0.083% nebulizer solution Take 2.5 mg by nebulization 4 (four) times daily.     albuterol  (VENTOLIN  HFA) 108 (90 Base) MCG/ACT inhaler Inhale 2 puffs into the lungs every 4 (four) hours as needed. For wheezing. 18 g 12   Ascorbic Acid  (VITAMIN C ) 1000 MG tablet Take 1,000 mg by mouth daily.     aspirin  EC 81 MG tablet Take 81 mg by mouth daily.     aspirin -acetaminophen -caffeine  (EXCEDRIN  MIGRAINE) 250-250-65 MG tablet Take 2 tablets by mouth every 6 (six) hours as needed for headache or migraine.      atorvastatin  (LIPITOR) 40 MG tablet Take 40 mg by mouth daily.     budesonide -formoterol  (SYMBICORT ) 160-4.5 MCG/ACT inhaler Inhale 2 puffs into the lungs in the morning and at bedtime. 1 each 12   carvedilol  (COREG ) 3.125 MG tablet Take 1 tablet (3.125 mg  total) by mouth 2 (two) times daily with a meal. 60 tablet 1   chlorpheniramine (CHLOR-TRIMETON) 4 MG tablet Take 4 mg by mouth 2 (two) times daily as needed for allergies.     diclofenac sodium (VOLTAREN) 1 % GEL Apply 2 g topically 3 (three) times daily as needed (joint pain).      furosemide  (LASIX ) 20 MG tablet  Take 20 mg by mouth 2 (two) times daily.     HYDROcodone -acetaminophen  (NORCO/VICODIN) 5-325 MG tablet Take 1 tablet by mouth daily as needed.     ipratropium (ATROVENT ) 0.06 % nasal spray Place 2 sprays into both nostrils 2 (two) times a day.     lisinopril  (ZESTRIL ) 2.5 MG tablet Take 1 tablet (2.5 mg total) by mouth daily. 30 tablet 1   Multiple Vitamin (MULTIVITAMIN) tablet Take 1 tablet by mouth daily.     pantoprazole  (PROTONIX ) 40 MG tablet Take 40 mg by mouth daily.     sacubitril-valsartan (ENTRESTO) 24-26 MG Take 1 tablet by mouth every 12 (twelve) hours. (Patient not taking: Reported on 10/21/2023)     Spacer/Aero-Holding Chambers (AEROCHAMBER MV) inhaler Use as instructed 1 each 0   SUMAtriptan  (IMITREX ) 50 MG tablet Take 50 mg by mouth every 2 (two) hours as needed for migraine.      TRELEGY ELLIPTA 200-62.5-25 MCG/ACT AEPB Inhale 1 puff into the lungs daily. (Patient not taking: Reported on 10/21/2023)     No current facility-administered medications for this visit.     Allergies:   Patient has no known allergies.   Social History:  The patient  reports that she quit smoking about 38 years ago. Her smoking use included cigarettes. She started smoking about 46 years ago. She has a 4 pack-year smoking history. She has never used smokeless tobacco. She reports that she does not drink alcohol and does not use drugs.   Family History:   family history is not on file.    Review of Systems: ROS   PHYSICAL EXAM: VS:  There were no vitals taken for this visit. , BMI There is no height or weight on file to calculate BMI. GEN: Well nourished, well developed, in no acute distress HEENT: normal Neck: no JVD, carotid bruits, or masses Cardiac: RRR; no murmurs, rubs, or gallops,no edema  Respiratory:  clear to auscultation bilaterally, normal work of breathing GI: soft, nontender, nondistended, + BS MS: no deformity or atrophy Skin: warm and dry, no rash Neuro:  Strength and  sensation are intact Psych: euthymic mood, full affect    Recent Labs: 09/19/2023: TSH 2.106 09/20/2023: Magnesium  2.1 09/22/2023: BUN 31; Creatinine, Ser 1.00; Hemoglobin 12.9; Platelets 337; Potassium 3.0; Sodium 143 10/30/2023: B Natriuretic Peptide 628.9    Lipid Panel Lab Results  Component Value Date   CHOL 154 03/06/2023   HDL 61 03/06/2023   LDLCALC 73 03/06/2023   TRIG 98 03/06/2023      Wt Readings from Last 3 Encounters:  09/22/23 161 lb 12.8 oz (73.4 kg)  08/15/23 174 lb (78.9 kg)  09/03/22 178 lb (80.7 kg)       ASSESSMENT AND PLAN:  Problem List Items Addressed This Visit   None    Disposition:   F/U  12 months   Total encounter time more than 30 minutes  Greater than 50% was spent in counseling and coordination of care with the patient    Signed, Velinda Lunger, M.D., Ph.D. [facility] [place], [place] [phone]

## 2024-07-19 ENCOUNTER — Ambulatory Visit: Attending: Cardiovascular Disease | Admitting: Cardiovascular Disease

## 2024-07-19 DIAGNOSIS — I420 Dilated cardiomyopathy: Secondary | ICD-10-CM

## 2024-07-19 DIAGNOSIS — R7303 Prediabetes: Secondary | ICD-10-CM

## 2024-07-19 DIAGNOSIS — I10 Essential (primary) hypertension: Secondary | ICD-10-CM

## 2024-07-19 DIAGNOSIS — I2510 Atherosclerotic heart disease of native coronary artery without angina pectoris: Secondary | ICD-10-CM

## 2024-07-19 DIAGNOSIS — G4733 Obstructive sleep apnea (adult) (pediatric): Secondary | ICD-10-CM

## 2024-07-19 DIAGNOSIS — I471 Supraventricular tachycardia, unspecified: Secondary | ICD-10-CM

## 2024-07-19 DIAGNOSIS — I272 Pulmonary hypertension, unspecified: Secondary | ICD-10-CM

## 2024-07-27 DIAGNOSIS — M542 Cervicalgia: Secondary | ICD-10-CM | POA: Diagnosis not present

## 2024-07-27 DIAGNOSIS — M4312 Spondylolisthesis, cervical region: Secondary | ICD-10-CM | POA: Diagnosis not present

## 2024-07-27 DIAGNOSIS — M5412 Radiculopathy, cervical region: Secondary | ICD-10-CM | POA: Diagnosis not present

## 2024-07-27 DIAGNOSIS — Z6826 Body mass index (BMI) 26.0-26.9, adult: Secondary | ICD-10-CM | POA: Diagnosis not present

## 2024-07-28 DIAGNOSIS — R300 Dysuria: Secondary | ICD-10-CM | POA: Diagnosis not present

## 2024-07-28 DIAGNOSIS — G8929 Other chronic pain: Secondary | ICD-10-CM | POA: Diagnosis not present

## 2024-07-28 DIAGNOSIS — J0100 Acute maxillary sinusitis, unspecified: Secondary | ICD-10-CM | POA: Diagnosis not present

## 2024-07-28 DIAGNOSIS — M25511 Pain in right shoulder: Secondary | ICD-10-CM | POA: Diagnosis not present

## 2024-08-06 DIAGNOSIS — H2512 Age-related nuclear cataract, left eye: Secondary | ICD-10-CM | POA: Diagnosis not present

## 2024-08-06 DIAGNOSIS — H25042 Posterior subcapsular polar age-related cataract, left eye: Secondary | ICD-10-CM | POA: Diagnosis not present

## 2024-08-06 DIAGNOSIS — H25012 Cortical age-related cataract, left eye: Secondary | ICD-10-CM | POA: Diagnosis not present

## 2024-08-13 DIAGNOSIS — H2512 Age-related nuclear cataract, left eye: Secondary | ICD-10-CM | POA: Diagnosis not present

## 2024-08-16 DIAGNOSIS — I5020 Unspecified systolic (congestive) heart failure: Secondary | ICD-10-CM | POA: Diagnosis not present

## 2024-08-18 ENCOUNTER — Other Ambulatory Visit: Payer: Self-pay | Admitting: Student

## 2024-08-18 DIAGNOSIS — M5412 Radiculopathy, cervical region: Secondary | ICD-10-CM

## 2024-09-07 ENCOUNTER — Ambulatory Visit

## 2024-09-07 DIAGNOSIS — I10 Essential (primary) hypertension: Secondary | ICD-10-CM | POA: Diagnosis not present

## 2024-09-07 DIAGNOSIS — J439 Emphysema, unspecified: Secondary | ICD-10-CM | POA: Diagnosis not present

## 2024-09-07 DIAGNOSIS — I2510 Atherosclerotic heart disease of native coronary artery without angina pectoris: Secondary | ICD-10-CM | POA: Diagnosis not present

## 2024-09-07 DIAGNOSIS — Z9889 Other specified postprocedural states: Secondary | ICD-10-CM | POA: Diagnosis not present

## 2024-09-07 DIAGNOSIS — E785 Hyperlipidemia, unspecified: Secondary | ICD-10-CM | POA: Diagnosis not present

## 2024-09-07 DIAGNOSIS — I471 Supraventricular tachycardia, unspecified: Secondary | ICD-10-CM | POA: Diagnosis not present

## 2024-09-07 DIAGNOSIS — I5020 Unspecified systolic (congestive) heart failure: Secondary | ICD-10-CM | POA: Diagnosis not present

## 2024-09-07 DIAGNOSIS — I340 Nonrheumatic mitral (valve) insufficiency: Secondary | ICD-10-CM | POA: Diagnosis not present

## 2024-09-07 DIAGNOSIS — I351 Nonrheumatic aortic (valve) insufficiency: Secondary | ICD-10-CM | POA: Diagnosis not present

## 2024-09-08 ENCOUNTER — Ambulatory Visit: Admission: RE | Admit: 2024-09-08 | Source: Ambulatory Visit

## 2024-09-08 ENCOUNTER — Ambulatory Visit
Admission: RE | Admit: 2024-09-08 | Discharge: 2024-09-08 | Disposition: A | Discharge status: Home / Self Care | Source: Ambulatory Visit | Attending: Student | Admitting: Student

## 2024-09-08 DIAGNOSIS — M5412 Radiculopathy, cervical region: Secondary | ICD-10-CM | POA: Diagnosis not present

## 2024-09-08 DIAGNOSIS — M47812 Spondylosis without myelopathy or radiculopathy, cervical region: Secondary | ICD-10-CM | POA: Diagnosis not present

## 2024-09-08 DIAGNOSIS — M47813 Spondylosis without myelopathy or radiculopathy, cervicothoracic region: Secondary | ICD-10-CM | POA: Diagnosis not present

## 2024-09-08 DIAGNOSIS — M50223 Other cervical disc displacement at C6-C7 level: Secondary | ICD-10-CM | POA: Diagnosis not present

## 2024-09-08 DIAGNOSIS — R200 Anesthesia of skin: Secondary | ICD-10-CM | POA: Diagnosis not present

## 2024-09-17 ENCOUNTER — Ambulatory Visit: Admitting: Cardiovascular Disease

## 2024-09-21 DIAGNOSIS — R062 Wheezing: Secondary | ICD-10-CM | POA: Diagnosis not present

## 2024-09-21 DIAGNOSIS — R052 Subacute cough: Secondary | ICD-10-CM | POA: Diagnosis not present

## 2024-09-22 ENCOUNTER — Other Ambulatory Visit

## 2024-09-24 DIAGNOSIS — M4712 Other spondylosis with myelopathy, cervical region: Secondary | ICD-10-CM | POA: Diagnosis not present

## 2024-09-24 DIAGNOSIS — M4312 Spondylolisthesis, cervical region: Secondary | ICD-10-CM | POA: Diagnosis not present

## 2024-09-24 DIAGNOSIS — Z6825 Body mass index (BMI) 25.0-25.9, adult: Secondary | ICD-10-CM | POA: Diagnosis not present

## 2024-09-24 DIAGNOSIS — M542 Cervicalgia: Secondary | ICD-10-CM | POA: Diagnosis not present

## 2024-09-24 DIAGNOSIS — M4722 Other spondylosis with radiculopathy, cervical region: Secondary | ICD-10-CM | POA: Diagnosis not present

## 2024-09-30 DIAGNOSIS — E782 Mixed hyperlipidemia: Secondary | ICD-10-CM | POA: Diagnosis not present

## 2024-09-30 DIAGNOSIS — I5020 Unspecified systolic (congestive) heart failure: Secondary | ICD-10-CM | POA: Diagnosis not present

## 2024-09-30 DIAGNOSIS — I10 Essential (primary) hypertension: Secondary | ICD-10-CM | POA: Diagnosis not present

## 2024-10-05 DIAGNOSIS — I2510 Atherosclerotic heart disease of native coronary artery without angina pectoris: Secondary | ICD-10-CM | POA: Diagnosis not present

## 2024-10-05 DIAGNOSIS — I5020 Unspecified systolic (congestive) heart failure: Secondary | ICD-10-CM | POA: Diagnosis not present

## 2024-11-07 NOTE — Progress Notes (Unsigned)
 Cardiology Office Note  Date:  11/08/2024   ID:  Margee, Trentham 16-Sep-1946, MRN [id number]  PCP:  Valora Lynwood FALCON, MD   Chief Complaint  Patient presents with   New Patient (Initial Visit)    Self referral to establish care for CHF/CAD. Patient c/o swelling in feet & palpitations.     HPI:  Paige Higgins is a 78 y.o. female with past medical history of: Nonischemic cardiomyopathy Non-obstructive CAD by [facility] 09/22/23 prox LAD 20% stenosis, prox LCX 20% stenosis. LVEF 35-45% HFrEF - diagnosis 09/20/23 EF 30-35% on 09/20/23 ECHO with moderately decreased LV function. Moderate LV dilation. RV systolic function normal with normal RV size. Moderate MR, mild AR. No valvular stenosis. EF 35-40% on 01/05/24 ECHO EF 40-45% on 08/16/24 ECHO Moderate MR with mild stenosis noted on LHC 09/22/23 moderate MR without MS noted on 08/2024 2D ECHO Mild-moderate AR  Pulmonary hypertension - per [facility] 09/22/23 pSVT - by 72-hour Holter 09/18/23 showing 6% SVE burden with 46 occurrences of SVT with fastest episode 170 bpm and longest episode 15 beats. Started on carvedilol  3.125 mg BID during 09/19/23 hospitalization.  10. Asthma/COPD Who presents by referral from Dr. Valora for  Chronic systolic CHF, coronary artery disease, mitral valve regurgitation, sleep apnea, hypertension  Previously seen by [facility] cardiology last September 27, 2024, has not started spironolactone  working at [employer] at [employer], serving food, and walking frequently.  7 to 3 Or 3 to 7pm Works 4-5 days a week Tired, nonstop  Tired at times  Previously reported could not afford Entresto and Jardiance Medications including carvedilol  6.25 mg BID  Valsartan 40 mg BID Continue furosemide  20 mg qd PRN leg edema Spiro 12.5mg  daily  Lipitor 40 daily  EKG personally reviewed by myself on todays visit EKG Interpretation Date/Time:  Monday November 08 2024 15:06:28  EST Ventricular Rate:  71 PR Interval:  142 QRS Duration:  112 QT Interval:  434 QTC Calculation: 471 R Axis:   -28  Text Interpretation: Normal sinus rhythm Nonspecific T wave abnormality Prolonged QT When compared with ECG of 21-Sep-2023 23:23, T wave inversion now evident in Inferior leads Confirmed by Perla Lye [phone]) on 11/08/2024 5:05:53 PM    PMH:   has a past medical history of Allergies, Arthritis, Asthma, Depression, Headache(784.0), Heart murmur, Hyperlipidemia, Hypertension, PONV (postoperative nausea and vomiting), Recurrent UTI, Sleep apnea, Spondylolisthesis of lumbar region, and Wears contact lenses.   PSH:    Past Surgical History:  Procedure Laterality Date   ABDOMINAL HYSTERECTOMY     ARTHROPLASTY  07/07/2012   LEFT SHOULDER   BREAST BIOPSY Left 2000   CARDIAC CATHETERIZATION     COLONOSCOPY     COLONOSCOPY WITH PROPOFOL  N/A 02/03/2020   Procedure: COLONOSCOPY WITH PROPOFOL ;  Surgeon: Toledo, Ladell POUR, MD;  Location: [facility] ENDOSCOPY;  Service: Gastroenterology;  Laterality: N/A;   JOINT REPLACEMENT     bilateral knee replacements   LUMBAR LAMINECTOMY/DECOMPRESSION MICRODISCECTOMY N/A 04/28/2014   Procedure: LUMBAR THREE TO FOUR, LUMBAR FOUR TO FIVE  LAMINECTOMY/DECOMPRESSION MICRODISCECTOMY 2 LEVELS;  Surgeon: Reyes JONETTA Budge, MD;  Location: [facility] NEURO ORS;  Service: Neurosurgery;  Laterality: N/A;  L34 L45 laminectomies   REVERSE SHOULDER ARTHROPLASTY  07/07/2012   Procedure: REVERSE SHOULDER ARTHROPLASTY;  Surgeon: Eva Elsie Herring, MD;  Location: [facility] OR;  Service: Orthopedics;  Laterality: Left;   REVERSE SHOULDER ARTHROPLASTY Right 03/10/2018   Procedure: REVERSE SHOULDER ARTHROPLASTY;  Surgeon: Edie Norleen PARAS, MD;  Location: [facility] ORS;  Service: Orthopedics;  Laterality: Right;   RIGHT/LEFT HEART CATH AND CORONARY ANGIOGRAPHY N/A 09/22/2023   Procedure: RIGHT/LEFT HEART CATH AND CORONARY ANGIOGRAPHY;  Surgeon: Ammon Blunt, MD;  Location: [facility]  INVASIVE CV LAB;  Service: Cardiovascular;  Laterality: N/A;   SHOULDER ARTHROSCOPY     right shoulder    Current Outpatient Medications  Medication Sig Dispense Refill   albuterol  (PROVENTIL ) (2.5 MG/3ML) 0.083% nebulizer solution Take 2.5 mg by nebulization 4 (four) times daily.     albuterol  (VENTOLIN  HFA) 108 (90 Base) MCG/ACT inhaler Inhale 2 puffs into the lungs every 4 (four) hours as needed. For wheezing. 18 g 12   Ascorbic Acid  (VITAMIN C ) 1000 MG tablet Take 1,000 mg by mouth daily.     aspirin  EC 81 MG tablet Take 81 mg by mouth daily.     aspirin -acetaminophen -caffeine  (EXCEDRIN  MIGRAINE) 250-250-65 MG tablet Take 2 tablets by mouth every 6 (six) hours as needed for headache or migraine.      azelastine (OPTIVAR) 0.05 % ophthalmic solution Place 1 drop into both eyes daily.     budesonide -formoterol  (SYMBICORT ) 160-4.5 MCG/ACT inhaler Inhale 2 puffs into the lungs in the morning and at bedtime. 1 each 12   chlorpheniramine (CHLOR-TRIMETON) 4 MG tablet Take 4 mg by mouth 2 (two) times daily as needed for allergies.     clindamycin (CLEOCIN) 2 % vaginal cream Place 1 Applicatorful vaginally as needed.     diclofenac sodium (VOLTAREN) 1 % GEL Apply 2 g topically 3 (three) times daily as needed (joint pain).      fluticasone  (FLONASE ) 50 MCG/ACT nasal spray Place 1 spray into both nostrils daily.     gatifloxacin (ZYMAXID) 0.5 % SOLN Place 1 drop into the left eye 4 (four) times daily.     HYDROcodone -acetaminophen  (NORCO/VICODIN) 5-325 MG tablet Take 1 tablet by mouth daily as needed.     ibuprofen (ADVIL) 200 MG tablet Take 200 mg by mouth every 6 (six) hours as needed.     ipratropium (ATROVENT ) 0.06 % nasal spray Place 2 sprays into both nostrils 2 (two) times a day.     ketorolac  (ACULAR ) 0.5 % ophthalmic solution Place 1 drop into both eyes as needed.     Multiple Vitamin (MULTIVITAMIN) tablet Take 1 tablet by mouth daily.     prednisoLONE acetate (PRED FORTE) 1 % ophthalmic  suspension Place 1 drop into the left eye 4 (four) times daily.     Spacer/Aero-Holding Chambers (AEROCHAMBER MV) inhaler Use as instructed 1 each 0   spironolactone (ALDACTONE) 25 MG tablet Take 0.5 tablets (12.5 mg total) by mouth daily. 90 tablet 3   SUMAtriptan  (IMITREX ) 50 MG tablet Take 50 mg by mouth every 2 (two) hours as needed for migraine.      TRELEGY ELLIPTA 200-62.5-25 MCG/ACT AEPB Inhale 1 puff into the lungs daily.     tretinoin  (RETIN-A ) 0.025 % cream Apply topically at bedtime.     atorvastatin  (LIPITOR) 40 MG tablet Take 1 tablet (40 mg total) by mouth daily. 90 tablet 3   carvedilol  (COREG ) 3.125 MG tablet Take 1 tablet (3.125 mg total) by mouth 2 (two) times daily with a meal. 90 tablet 3   pantoprazole  (PROTONIX ) 40 MG tablet Take 40 mg by mouth daily. (Patient not taking: Reported on 11/08/2024)     valsartan (DIOVAN) 80 MG tablet Take 0.5 tablets (40 mg total) by mouth 2 (two) times daily. 90 tablet 3   No current facility-administered medications  for this visit.     Allergies:   Patient has no known allergies.   Social History:  The patient  reports that she quit smoking about 38 years ago. Her smoking use included cigarettes. She started smoking about 46 years ago. She has a 4 pack-year smoking history. She has never used smokeless tobacco. She reports that she does not drink alcohol and does not use drugs.   Family History:   family history is not on file.    Review of Systems: ROS   PHYSICAL EXAM: VS:  BP (!) 140/70 (BP Location: Right Arm, Patient Position: Sitting, Cuff Size: Normal)   Pulse 71   Ht 5' 2 (1.575 m)   Wt 157 lb 6 oz (71.4 kg)   SpO2 96%   BMI 28.78 kg/m  , BMI Body mass index is 28.78 kg/m. GEN: Well nourished, well developed, in no acute distress HEENT: normal Neck: no JVD, carotid bruits, or masses Cardiac: RRR; no murmurs, rubs, or gallops,no edema  Respiratory:  clear to auscultation bilaterally, normal work of breathing GI:  soft, nontender, nondistended, + BS MS: no deformity or atrophy Skin: warm and dry, no rash Neuro:  Strength and sensation are intact Psych: euthymic mood, full affect    Recent Labs: No results found for requested labs within last 365 days.    Lipid Panel Lab Results  Component Value Date   CHOL 154 03/06/2023   HDL 61 03/06/2023   LDLCALC 73 03/06/2023   TRIG 98 03/06/2023      Wt Readings from Last 3 Encounters:  11/08/24 157 lb 6 oz (71.4 kg)  09/22/23 161 lb 12.8 oz (73.4 kg)  08/15/23 174 lb (78.9 kg)       ASSESSMENT AND PLAN:  Problem List Items Addressed This Visit       Cardiology Problems   Coronary artery disease involving native coronary artery of native heart without angina pectoris   Relevant Medications   spironolactone (ALDACTONE) 25 MG tablet   carvedilol  (COREG ) 3.125 MG tablet   valsartan (DIOVAN) 80 MG tablet   atorvastatin  (LIPITOR) 40 MG tablet   Nonsustained supraventricular tachycardia   Relevant Medications   spironolactone (ALDACTONE) 25 MG tablet   carvedilol  (COREG ) 3.125 MG tablet   valsartan (DIOVAN) 80 MG tablet   atorvastatin  (LIPITOR) 40 MG tablet   Other Relevant Orders   EKG 12-Lead (Completed)   PHT (pulmonary hypertension) (HCC)   Relevant Medications   spironolactone (ALDACTONE) 25 MG tablet   carvedilol  (COREG ) 3.125 MG tablet   valsartan (DIOVAN) 80 MG tablet   atorvastatin  (LIPITOR) 40 MG tablet   Nonrheumatic aortic valve insufficiency   Relevant Medications   spironolactone (ALDACTONE) 25 MG tablet   carvedilol  (COREG ) 3.125 MG tablet   valsartan (DIOVAN) 80 MG tablet   atorvastatin  (LIPITOR) 40 MG tablet   Essential hypertension   Relevant Medications   spironolactone (ALDACTONE) 25 MG tablet   carvedilol  (COREG ) 3.125 MG tablet   valsartan (DIOVAN) 80 MG tablet   atorvastatin  (LIPITOR) 40 MG tablet   Other Relevant Orders   EKG 12-Lead (Completed)     Other   Prediabetes   OSA (obstructive sleep  apnea)   Other Visit Diagnoses       NICM (nonischemic cardiomyopathy) (HCC)    -  Primary   Relevant Medications   spironolactone (ALDACTONE) 25 MG tablet   carvedilol  (COREG ) 3.125 MG tablet   valsartan (DIOVAN) 80 MG tablet   atorvastatin  (LIPITOR) 40 MG tablet  Other Relevant Orders   EKG 12-Lead (Completed)      Nonischemic cardiomyopathy -Slow improvement in ejection fraction over the past several years, appears euvolemic, stable Tolerating carvedilol  valsartan, diuretic as needed Could not afford Jardiance/Farxiga or Entresto Reports she has not started spironolactone, was nervous to start -After discussing risk and benefit recommended she start spironolactone 12.5 mg daily, close monitoring of blood pressure at home and call us  with numbers  Coronary artery disease with stable angina Currently with no symptoms of angina. No further workup at this time. Continue current medication regimen.  Essential hypertension Blood pressure mildly elevated, recommend she start spironolactone as above Close monitoring of blood pressure at home  Hyperlipidemia Continue Lipitor 40 daily   Signed, Tim Blessing Zaucha, M.D., Ph.D. [facility] [place], [place] [phone]

## 2024-11-08 ENCOUNTER — Encounter: Payer: Self-pay | Admitting: Cardiovascular Disease

## 2024-11-08 ENCOUNTER — Ambulatory Visit: Attending: Cardiovascular Disease | Admitting: Cardiovascular Disease

## 2024-11-08 VITALS — BP 140/70 | HR 71 | Ht 62.0 in | Wt 157.4 lb

## 2024-11-08 DIAGNOSIS — R7303 Prediabetes: Secondary | ICD-10-CM | POA: Diagnosis not present

## 2024-11-08 DIAGNOSIS — I10 Essential (primary) hypertension: Secondary | ICD-10-CM | POA: Diagnosis not present

## 2024-11-08 DIAGNOSIS — G4733 Obstructive sleep apnea (adult) (pediatric): Secondary | ICD-10-CM | POA: Diagnosis not present

## 2024-11-08 DIAGNOSIS — I272 Pulmonary hypertension, unspecified: Secondary | ICD-10-CM | POA: Diagnosis not present

## 2024-11-08 DIAGNOSIS — I428 Other cardiomyopathies: Secondary | ICD-10-CM

## 2024-11-08 DIAGNOSIS — I351 Nonrheumatic aortic (valve) insufficiency: Secondary | ICD-10-CM

## 2024-11-08 DIAGNOSIS — I471 Supraventricular tachycardia, unspecified: Secondary | ICD-10-CM | POA: Diagnosis not present

## 2024-11-08 DIAGNOSIS — I25118 Atherosclerotic heart disease of native coronary artery with other forms of angina pectoris: Secondary | ICD-10-CM

## 2024-11-08 MED ORDER — VALSARTAN 80 MG PO TABS: 40.0000 mg | ORAL_TABLET | Freq: Two times a day (BID) | ORAL | 3 refills | Status: AC

## 2024-11-08 MED ORDER — SPIRONOLACTONE 25 MG PO TABS: 12.5000 mg | ORAL_TABLET | Freq: Every day | ORAL | 3 refills | Status: DC

## 2024-11-08 MED ORDER — CARVEDILOL 3.125 MG PO TABS: 3.1250 mg | ORAL_TABLET | Freq: Two times a day (BID) | ORAL | 3 refills | Status: DC

## 2024-11-08 MED ORDER — ATORVASTATIN CALCIUM 40 MG PO TABS: 40.0000 mg | ORAL_TABLET | Freq: Every day | ORAL | 3 refills | Status: AC

## 2024-11-08 NOTE — Patient Instructions (Addendum)
 Medication Instructions:  Please start spironolactone 12.5 mg daily  If you need a refill on your cardiac medications before your next appointment, please call your pharmacy.   Lab work: No new labs needed  Testing/Procedures: No new testing needed  Follow-Up: At [facility], you and your health needs are our priority.  As part of our continuing mission to provide you with exceptional heart care, we have created designated Provider Care Teams.  These Care Teams include your primary Cardiologist (physician) and Advanced Practice Providers (APPs -  Physician Assistants and Nurse Practitioners) who all work together to provide you with the care you need, when you need it.  You will need a follow up appointment in 12 months  Providers on your designated Care Team:   Lonni Meager, NP Bernardino Bring, PA-C Cadence [place], [place]  COVID-19 Vaccine Information can be found at: [email or web address] For questions related to vaccine distribution or appointments, please email [email or web address] or call [phone].

## 2024-11-25 ENCOUNTER — Telehealth: Payer: Self-pay | Admitting: Cardiovascular Disease

## 2024-11-25 NOTE — Telephone Encounter (Signed)
 Returned call to pt; identified using 2 identifiers;  pt concerned that since she has changed her BP medications that it appears that her BP has been elevated; pt states that prior to starting Carvedilol  and Spironolactone  and she was taking triamterene -hydrochlorothiazide   (MAXZIDE -25) 37.5-her blood pressure was lower 130s-140s systolic; Her last two BP readings are 150/68 and today 150/90. HR in the 70s.  She states that she has taken the new medication today along with her other daily medications.    Pt would like to know if she needs different medications or a change in dosage.  I reassured her that I would send her questions and concerns to Dr. Gollan for his recommendations.

## 2024-11-25 NOTE — Telephone Encounter (Signed)
 Pt c/o BP issue: STAT if pt c/o blurred vision, one-sided weakness or slurred speech.  STAT if BP is GREATER than 180/120 TODAY.  STAT if BP is LESS than 90/60 and SYMPTOMATIC TODAY  1. What is your BP concern? Hypertension   2. Have you taken any BP medication today?yes  3. What are your last 5 BP readings?150/68   4. Are you having any other symptoms (ex. Dizziness, headache, blurred vision, passed out)? Headache

## 2024-11-26 ENCOUNTER — Other Ambulatory Visit (HOSPITAL_COMMUNITY): Payer: Self-pay

## 2024-11-26 MED ORDER — CARVEDILOL 6.25 MG PO TABS
6.2500 mg | ORAL_TABLET | Freq: Two times a day (BID) | ORAL | 3 refills | Status: AC
  Filled 2024-11-26: qty 180, 90d supply, fill #0

## 2024-11-26 MED ORDER — SPIRONOLACTONE 25 MG PO TABS
12.5000 mg | ORAL_TABLET | Freq: Every day | ORAL | 3 refills | Status: AC
  Filled 2024-11-26: qty 90, 180d supply, fill #0

## 2024-11-26 NOTE — Telephone Encounter (Signed)
 Pt requesting a c/b from the nurse

## 2024-11-26 NOTE — Telephone Encounter (Signed)
 Pt made aware of MD's recommendations and verbalized understanding. Medication list updated

## 2024-11-26 NOTE — Addendum Note (Signed)
 Addended by: DESIDERIO RUSSELL SAILOR on: 11/26/2024 03:26 PM   Modules accepted: Orders

## 2024-11-26 NOTE — Addendum Note (Signed)
 Addended by: DESIDERIO RUSSELL SAILOR on: 11/26/2024 03:31 PM   Modules accepted: Orders

## 2024-11-26 NOTE — Telephone Encounter (Signed)
Called patient. No answer. Left voicemail to return call. 

## 2024-12-08 ENCOUNTER — Other Ambulatory Visit (HOSPITAL_COMMUNITY): Payer: Self-pay

## 2025-01-14 ENCOUNTER — Telehealth: Payer: Self-pay | Admitting: Cardiovascular Disease

## 2025-01-14 NOTE — Telephone Encounter (Signed)
 Attempted to call patient. Unable to leave message-VM full.

## 2025-01-14 NOTE — Telephone Encounter (Signed)
 Pt wants to discuss carvedilol , spironolactone  & valsartan . Pt would like a c/b regarding this matter. Please advise.
